# Patient Record
Sex: Female | Born: 1980
Health system: Southern US, Community
[De-identification: ages and names within clinical notes are randomized; demographics above are authoritative.]

## PROBLEM LIST (undated history)

## (undated) DIAGNOSIS — F1110 Opioid abuse, uncomplicated: Secondary | ICD-10-CM

## (undated) DIAGNOSIS — K259 Gastric ulcer, unspecified as acute or chronic, without hemorrhage or perforation: Secondary | ICD-10-CM

## (undated) DIAGNOSIS — N83209 Unspecified ovarian cyst, unspecified side: Secondary | ICD-10-CM

## (undated) DIAGNOSIS — S83006A Unspecified dislocation of unspecified patella, initial encounter: Secondary | ICD-10-CM

## (undated) DIAGNOSIS — K219 Gastro-esophageal reflux disease without esophagitis: Secondary | ICD-10-CM

## (undated) DIAGNOSIS — K429 Umbilical hernia without obstruction or gangrene: Secondary | ICD-10-CM

## (undated) DIAGNOSIS — R519 Headache, unspecified: Secondary | ICD-10-CM

## (undated) DIAGNOSIS — F4325 Adjustment disorder with mixed disturbance of emotions and conduct: Secondary | ICD-10-CM

## (undated) DIAGNOSIS — R55 Syncope and collapse: Secondary | ICD-10-CM

## (undated) DIAGNOSIS — O039 Complete or unspecified spontaneous abortion without complication: Secondary | ICD-10-CM

## (undated) DIAGNOSIS — F419 Anxiety disorder, unspecified: Secondary | ICD-10-CM

## (undated) HISTORY — PX: WISDOM TOOTH EXTRACTION: SHX21

## (undated) HISTORY — PX: ESOPHAGOGASTRODUODENOSCOPY: SHX1529

---
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
## 2004-05-31 ENCOUNTER — Other Ambulatory Visit: Admission: RE | Admit: 2004-05-31 | Discharge: 2004-05-31 | Payer: Self-pay | Admitting: Obstetrics and Gynecology

## 2007-08-09 ENCOUNTER — Inpatient Hospital Stay (HOSPITAL_COMMUNITY): Admission: AD | Admit: 2007-08-09 | Discharge: 2007-08-09 | Payer: Self-pay | Admitting: Gynecology

## 2007-08-15 ENCOUNTER — Encounter (INDEPENDENT_AMBULATORY_CARE_PROVIDER_SITE_OTHER): Payer: Self-pay | Admitting: Obstetrics & Gynecology

## 2007-08-15 ENCOUNTER — Inpatient Hospital Stay (HOSPITAL_COMMUNITY): Admission: RE | Admit: 2007-08-15 | Discharge: 2007-08-18 | Payer: Self-pay | Admitting: Obstetrics and Gynecology

## 2009-06-08 ENCOUNTER — Emergency Department (HOSPITAL_COMMUNITY): Admission: EM | Admit: 2009-06-08 | Discharge: 2009-06-08 | Payer: Self-pay | Admitting: Family Medicine

## 2010-02-20 ENCOUNTER — Emergency Department (HOSPITAL_COMMUNITY): Admission: EM | Admit: 2010-02-20 | Discharge: 2010-02-20 | Payer: Self-pay | Admitting: Family Medicine

## 2011-04-22 NOTE — Consult Note (Signed)
[place]  Patient:    Debbie Griffin, [name]                        MRN: [id number] Proc. Date: 11/16/00 Adm. Date:  [id number] Attending:  Marily Memos CC:         Nolon Nations, M.D., M.P.H., [facility]   Consultation Report  DATE OF BIRTH:  1981/10/24  REASON FOR CONSULTATION:  Abdominal pain.  HISTORY OF ILLNESS:  Debbie Griffin is a 30 year old white female who is a patient of Dr. Corine Shelter, developed a kind of sudden onset of abdominal pain at about 4 P.M. this afternoon after getting out of school.  She had pizza for lunch. She has had no chronic or acute prior episodes of abdominal pain like this. She was seen in their office and in the emergency room where she had been evaluated by Dr. Carleene Cooper.  He has asked me to see her from a surgical standpoint for abdominal pain.  PAST MEDICAL HISTORY:  She denies any history of peptic ulcer disease, liver disease, prior abdominal surgery or problems like this.  REVIEW OF SYSTEMS:  Pulmonary:  No history of pneumonia or tuberculosis. Cardiac:  No history of chest pain.  Gastrointestinal:  See history of present illness.  Urologic:  No history of kidney stones or kidney infection.  Gyn: She was recently started on birth control pills last month because of cramps and excessive bleeding; and, she is actually on her period or starting her period right about now.  SOCIAL HISTORY:  She is an Warden/ranger at [employer].  PHYSICAL EXAMINATION:  VITAL SIGNS:  On physical exam her temperature is 98.3, pulse 63, respirations 26 and blood pressure 117/57.  GENERAL APPEARANCE:  She is a well-nourished white female who is somewhat hysterical in her complaints.  She really cannot give much of a history.  Her mother gave almost her entire history.  She is lying on her left side where she is most comfortable.  NECK:  Supple.  LUNGS:  Clear to auscultation.  HEART:   Regular rate and rhythm.  ABDOMEN:  When distracted really is pretty benign.  I hear active bowel sounds.  She has no guarding or rebound, though when she tenses up she is tender on abdominal exam.  PELVIC AND RECTAL:  I did not try to do a pelvic or rectal because of her hysteria.  I really do not think it will add to my initial evaluation or diagnosis.  LABORATORY DATA:  Totally normal liver function tests with an SGOT of 21, SGPT of 14 and alkaline phos of 82, bilirubin of 0.8.  Her lipase is 33.  Her white blood cell count is 7,900 with an absolutely normal differential with 64% neutrophils, 29% lymphocytes, 4% monocytes, and 1% eosinophils.  Her urine pregnancy was negative.  IMPRESSION:  Some of an acute gastrointestinal inflammation like gastroenteritis versus a ruptured ovarian cyst versus some type of abdominal spasm, but she certainly has no evidence of acute surgical abdomen.  I reviewed the CT scan with Dr. Bonney Leitz.  We thought we could visualize the appendix.  It showed no evidence of inflammation or stranding.  She does have a cyst of the right ovary, but she has really minimal if any fluid in her pelvis.  She has no other stigmata on CT scan of abdominal catastrophe.  Dr. Ignacia Palma will be back in touch with  [facility] and Dr. Corine Shelter regarding further evaluation; and, I will follow patient either in house or discharge. DD:  11/16/00 TD:  11/16/00 Job: [id number]

## 2011-04-22 NOTE — H&P (Signed)
[place]. [facility]  Patient:    Debbie Griffin, Debbie Griffin                        MRN: [id number] Adm. Date:  [id number] Attending:  Marily Memos                         History and Physical  DATE OF BIRTH: [birth year]  CHIEF COMPLAINT: Abdominal pain.  HISTORY OF PRESENT ILLNESS: This patient is a 30 year old white female with apparent negative past medical history, who had sudden onset of abdominal pain in the epigastric area at approximately 3:30 p.m. after school let out today. This rapidly worsened and by 4 p.m. the pain was sharp and severe and constant, and worsened with movement.  She had at that time no complaints of nausea, vomiting, diarrhea, fever, dysuria, or frequency - although since drinking the CT contrast she has had some nausea and vomiting.  There is no history of alcohol use or GI problems.  She does have painful menses and is currently having her period.  PAST MEDICAL HISTORY: Negative.  PAST SURGICAL HISTORY: None.  MEDICATIONS: None.  ALLERGIES: No known drug allergies.  SOCIAL HISTORY: No cigarettes, no alcohol, no drugs.  The patient attends school and is active in basketball and soccer.  FAMILY HISTORY: Negative for CAD, cancer, hypertension, diabetes, inflammatory bowel disease, COPD.  REVIEW OF SYSTEMS: Positive for abdominal pain with deep inspiration.  No chest pain, headache, visual changes, sore throat, cough, rhinorrhea, bright red blood per rectum, hematemesis.  Please see above.  PHYSICAL EXAMINATION:  VITAL SIGNS: Temperature 96.6 degrees.  Vital signs stable.  Pulse oximetry 100% on room air.  GENERAL: The patient is drowsy.  SKIN: Warm and dry.  No rash.  HEENT: TMs clear.  EOMI.  PERRL.  Pharynx without lesions.  Oropharynx clear. Good dentition.  Mucosa moist.  NECK: Supple.  No thyromegaly or adenopathy.  No JVD.  LUNGS: Clear.  No wheezes or crackles.  BACK: No CVA  tenderness.  CARDIOVASCULAR: S1 and S2, normal rate.  ABDOMEN: Positive bowel sounds, nondistended.  No hepatosplenomegaly or masses, though examination is difficult secondary to pain.  She does have significant pain to palpation in the epigastric and left upper quadrant area. Negative rebound.  GU/RECTAL: Examinations are deferred.  EXTREMITIES: Pulses 2+.  No clubbing, cyanosis, or edema.  NEUROLOGIC: The patient is alert and oriented x 4.  Cranial nerves 2-12 intact.  Motor 5/5.  DTRs 2+.  Sensory intact to fine touch.  LABORATORY DATA: CBC, CMET, lipase, and urinalysis are normal.  Urine pregnancy test negative.  Abdominal CT shows 2.2 cm right ovarian cyst, otherwise within normal limits.  ASSESSMENT/PLAN: The patient is a 30 year old with acute onset of severe abdominal pain.  The patient has been evaluated by Dr. Ovidio Kin and will be admitted for observation.  She will be made NPO and given intravenous fluids as well as morphine to control pain.  Amylase added to laboratories. The patient may need gastroenterology consultation for further evaluation to rule out gastric or other gastrointestinal pathology. DD:  11/16/00 TD:  11/16/00 Job: [id number]

## 2011-05-16 ENCOUNTER — Other Ambulatory Visit: Payer: Self-pay | Admitting: Obstetrics and Gynecology

## 2011-09-16 LAB — CBC
HCT: 31.3 — ABNORMAL LOW
HCT: 32.1 — ABNORMAL LOW
HCT: 36.3
HCT: 37.4
Hemoglobin: 11 — ABNORMAL LOW
Hemoglobin: 11.1 — ABNORMAL LOW
Hemoglobin: 12.7
Hemoglobin: 13.1
MCHC: 34.7
MCHC: 34.9
MCHC: 35.1
MCHC: 35.2
MCV: 90.4
MCV: 91.4
MCV: 91.8
MCV: 92.5
Platelets: 104 — ABNORMAL LOW
Platelets: 108 — ABNORMAL LOW
Platelets: 123 — ABNORMAL LOW
Platelets: 99 — ABNORMAL LOW
RBC: 3.41 — ABNORMAL LOW
RBC: 3.47 — ABNORMAL LOW
RBC: 3.97
RBC: 4.13
RDW: 12
RDW: 12.2
RDW: 12.2
RDW: 12.4
WBC: 11.1 — ABNORMAL HIGH
WBC: 12.4 — ABNORMAL HIGH
WBC: 13.2 — ABNORMAL HIGH
WBC: 22.8 — ABNORMAL HIGH

## 2011-09-16 LAB — DIFFERENTIAL
Basophils Absolute: 0
Basophils Absolute: 0
Basophils Relative: 0
Basophils Relative: 0
Eosinophils Absolute: 0.2
Eosinophils Absolute: 0.2
Eosinophils Relative: 1
Eosinophils Relative: 2
Lymphocytes Relative: 17
Lymphocytes Relative: 20
Lymphs Abs: 2.1
Lymphs Abs: 2.2
Monocytes Absolute: 0.8 — ABNORMAL HIGH
Monocytes Absolute: 0.8 — ABNORMAL HIGH
Monocytes Relative: 6
Monocytes Relative: 7
Neutro Abs: 7.8 — ABNORMAL HIGH
Neutro Abs: 9.3 — ABNORMAL HIGH
Neutrophils Relative %: 71
Neutrophils Relative %: 75

## 2011-09-16 LAB — RPR: RPR Ser Ql: NONREACTIVE

## 2012-10-10 ENCOUNTER — Inpatient Hospital Stay (HOSPITAL_COMMUNITY): Payer: Self-pay

## 2012-10-10 ENCOUNTER — Inpatient Hospital Stay (HOSPITAL_COMMUNITY)
Admission: AD | Admit: 2012-10-10 | Discharge: 2012-10-11 | Disposition: A | Discharge status: Home / Self Care | Payer: Self-pay | Source: Ambulatory Visit | Attending: Emergency Medicine | Admitting: Emergency Medicine

## 2012-10-10 ENCOUNTER — Encounter (HOSPITAL_COMMUNITY): Payer: Self-pay

## 2012-10-10 DIAGNOSIS — F17200 Nicotine dependence, unspecified, uncomplicated: Secondary | ICD-10-CM | POA: Insufficient documentation

## 2012-10-10 DIAGNOSIS — K254 Chronic or unspecified gastric ulcer with hemorrhage: Secondary | ICD-10-CM | POA: Insufficient documentation

## 2012-10-10 DIAGNOSIS — Z8742 Personal history of other diseases of the female genital tract: Secondary | ICD-10-CM | POA: Insufficient documentation

## 2012-10-10 HISTORY — DX: Complete or unspecified spontaneous abortion without complication: O03.9

## 2012-10-10 HISTORY — DX: Unspecified ovarian cyst, unspecified side: N83.209

## 2012-10-10 LAB — CBC WITH DIFFERENTIAL/PLATELET
Basophils Absolute: 0 10*3/uL (ref 0.0–0.1)
Basophils Relative: 0 % (ref 0–1)
Eosinophils Relative: 1 % (ref 0–5)
HCT: 41 % (ref 36.0–46.0)
MCHC: 35.1 g/dL (ref 30.0–36.0)
MCV: 89.3 fL (ref 78.0–100.0)
Monocytes Absolute: 0.5 10*3/uL (ref 0.1–1.0)
RDW: 12.1 % (ref 11.5–15.5)

## 2012-10-10 LAB — OCCULT BLOOD, POC DEVICE: Fecal Occult Bld: POSITIVE

## 2012-10-10 MED ORDER — KETOROLAC TROMETHAMINE 60 MG/2ML IM SOLN
60.0000 mg | Freq: Once | INTRAMUSCULAR | Status: AC
  Administered 2012-10-10: 60 mg via INTRAMUSCULAR
  Filled 2012-10-10: qty 2

## 2012-10-10 MED ORDER — MORPHINE SULFATE 4 MG/ML IJ SOLN
INTRAMUSCULAR | Status: AC
  Filled 2012-10-10: qty 1

## 2012-10-10 MED ORDER — ONDANSETRON HCL 4 MG/2ML IJ SOLN
4.0000 mg | Freq: Once | INTRAMUSCULAR | Status: DC
  Filled 2012-10-10: qty 2

## 2012-10-10 MED ORDER — SODIUM CHLORIDE 0.9 % IV SOLN
INTRAVENOUS | Status: DC
  Administered 2012-10-10: 20:00:00 via INTRAVENOUS

## 2012-10-10 MED ORDER — MORPHINE SULFATE 10 MG/ML IJ SOLN: 2.0000 mg | Freq: Once | INTRAMUSCULAR | Status: DC

## 2012-10-10 MED ORDER — MORPHINE SULFATE 10 MG/ML IJ SOLN
2.0000 mg | Freq: Once | INTRAMUSCULAR | Status: AC
  Administered 2012-10-10: 2 mg via INTRAVENOUS
  Filled 2012-10-10: qty 1

## 2012-10-10 MED ORDER — MORPHINE SULFATE 4 MG/ML IJ SOLN
2.0000 mg | Freq: Once | INTRAMUSCULAR | Status: AC
  Administered 2012-10-10: 2 mg via INTRAVENOUS
  Filled 2012-10-10: qty 1

## 2012-10-10 NOTE — MAU Note (Signed)
P states stomach hurt like this last Monday, then began again today, was told she had 2 ovarian cysts with one hemorrhaging per MD office. Has been bleeding x1.5 months.

## 2012-10-10 NOTE — ED Notes (Signed)
Received report from charge nurse about a pt [facility]) coming to ED, room 2. Shortly after, pt  Came in via EMS, pt was made comfortable in bed, vital signs were obtained, pt was placed on the monitor, and assessment was made. Pt stated that she did have a miscarriage about 10 weeks ago and has been doing fine since then till last week Monday she started bleeding in the middle of the night with abd pain and also started vomiting blood. Pt states she went and saw her OBG doctor, some tests were run and she was told she had a cysts on her ovaries, in any case the bleeding and vomiting stopped. Then today she started having the abd pain again and vomiting blood x3 which was dark brown in color. She went to see her OBG doctor today and she was transferred over her. She said she was given some morphine IV for a pain of 9/10 and now it is 7/10. Will continue to monitor pt

## 2012-10-10 NOTE — MAU Note (Signed)
Lower abd pain, dx with bilateral ovarian cyst- one is bleeding.  Last wk with pain was throwing up- noted blood in emesis.  Bad pain started again today, called MD- instucted to come here.

## 2012-10-10 NOTE — MAU Provider Note (Signed)
History     CSN: [id number]  Arrival date and time: 10/10/12 4098   First Provider Initiated Contact with Patient 10/10/12 1710      Chief Complaint  Patient presents with  . Abdominal Pain  . Hematemesis   HPI This is a 31 y.o. female at early gestation with a known SAB in progress who presents with severe lower abdominal pain which precipitated vomiting.  The vomitus was comprised of mostly blood. Denies fever.  Denies diarrhea or constipation. RN Note: Lower abd pain, dx with bilateral ovarian cyst- one is bleeding. Last wk with pain was throwing up- noted blood in emesis. Bad pain started again today, called MD- instucted to come here.      OB History    Grav Para Term Preterm Abortions TAB SAB Ect Mult Living   2 1 1  1  1   1       Past Medical History  Diagnosis Date  . Miscarriage   . Ovarian cyst     Past Surgical History  Procedure Date  . Wisdom tooth extraction     Family History  Problem Relation Age of Onset  . Other Neg Hx     History  Substance Use Topics  . Smoking status: Current Some Day Smoker    Types: Cigarettes  . Smokeless tobacco: Never Used  . Alcohol Use: No    Allergies: No Known Allergies  Prescriptions prior to admission  Medication Sig Dispense Refill  . ibuprofen (ADVIL,MOTRIN) 200 MG tablet Take 400 mg by mouth every 6 (six) hours as needed. Knee pain      . oxycodone-acetaminophen (PERCOCET) 2.5-325 MG per tablet Take 1 tablet by mouth every 4 (four) hours as needed. Stomach pain        ROS See HPI  Physical Exam   Blood pressure 115/82, pulse 83, temperature 98.1 F (36.7 C), temperature source Oral, resp. rate 22, height 5' 5.5" (1.664 m), weight 170 lb (77.111 kg).  Physical Exam  Constitutional: She is oriented to person, place, and time. She appears well-developed and well-nourished. She appears distressed (with pain).  Cardiovascular: Normal rate and regular rhythm.   Respiratory: Effort normal.  GI: Soft.  She exhibits no distension and no mass. There is tenderness (slight over lower abdomen, does not localize.). There is no rebound and no guarding.  Genitourinary: Uterus normal. Vaginal discharge (small blood) found.       Uterus small, somewhat tender No cervical motion tenderness  Musculoskeletal: Normal range of motion.  Neurological: She is alert and oriented to person, place, and time.  Skin: Skin is warm and dry.  Psychiatric: She has a normal mood and affect.   US Pelvis Complete  10/10/2012  *RADIOLOGY REPORT*  Clinical Data: Severe lower abdominal pain, known bilateral ovarian cysts; unknown pregnancy status; per nurse, recent pregnancy, patient being followed as a potential spontaneous abortion in progress  TRANSABDOMINAL AND TRANSVAGINAL ULTRASOUND OF PELVIS Technique:  Both transabdominal and transvaginal ultrasound examinations of the pelvis were performed. Transabdominal technique was performed for global imaging of the pelvis including uterus, ovaries, adnexal regions, and pelvic cul-de-sac.  It was necessary to proceed with endovaginal exam following the transabdominal exam to visualize the endometrium and ovaries.  Comparison:  None  Findings:  Uterus: 8.3 cm length by 4.5 cm AP and 7.1 cm transverse.  No focal uterine mass.  Endometrium: Abnormal appearance, thickened, heterogeneous echogenicity, 15 mm diameter and upper uterine segment.  No definite endometrial fluid or gestational sac  identified.  Right ovary:  4.5 x 2.5 x 2.6 cm.  Normal morphology without mass. Largest follicle measures 2.3 cm diameter.  Left ovary: 3.3 x 2.5 x 2.4 cm.  Normal morphology without mass.  Other findings: No free pelvic fluid or adnexal masses identified.  IMPRESSION: Abnormal appearance of the endometrial complex which is thickened and heterogeneous in echotexture. This is nonspecific can be seen with hyperplasia, tumor, or potentially with blood/clot within endometrial canal. As the patient's current  pregnancy status is uncertain, ectopic pregnancy cannot be excluded in this setting; correlation with quantitative beta HCG recommended. Unremarkable adnexae and ovaries.   Original Report Authenticated By: Ulyses Southward, M.D.     MAU Course  Procedures  MDM Discussed with Dr Claiborne Billings.  Ordered Toradol for headache and abdominal pain and [place] to evaluated uterine contents.  Assessment and Plan  Care assumed by Dr Claiborne Billings who has conferred with [facility] ED for transfer.  Wynelle Bourgeois 10/10/2012, 5:50 PM

## 2012-10-10 NOTE — ED Notes (Signed)
ZOX:[id number]<AV> Expected date:10/10/12<BR> Expected time: 9:28 PM<BR> Means of arrival:<BR> Comments:<BR> [facility]

## 2012-10-10 NOTE — ED Notes (Signed)
Pt was given ice chips, attempted to collect urine sample and patient stated she cannot urinate at this time.

## 2012-10-10 NOTE — MAU Note (Signed)
Aug 3, period in Sept was 2 wks late.  Had pos preg at [facility] last wk.  Has had repeated blood work- level is going down.

## 2012-10-11 ENCOUNTER — Inpatient Hospital Stay (HOSPITAL_COMMUNITY): Payer: Self-pay

## 2012-10-11 LAB — COMPREHENSIVE METABOLIC PANEL
AST: 19 U/L (ref 0–37)
CO2: 22 mEq/L (ref 19–32)
Calcium: 9 mg/dL (ref 8.4–10.5)
Creatinine, Ser: 0.87 mg/dL (ref 0.50–1.10)
GFR calc non Af Amer: 89 mL/min — ABNORMAL LOW (ref >90)

## 2012-10-11 LAB — URINE MICROSCOPIC-ADD ON

## 2012-10-11 LAB — LIPASE, BLOOD: Lipase: 22 U/L (ref 11–59)

## 2012-10-11 LAB — URINALYSIS, ROUTINE W REFLEX MICROSCOPIC
Glucose, UA: NEGATIVE mg/dL
Leukocytes, UA: NEGATIVE
Nitrite: NEGATIVE
Protein, ur: 30 mg/dL — AB
Urobilinogen, UA: 1 mg/dL (ref 0.0–1.0)

## 2012-10-11 MED ORDER — GI COCKTAIL ~~LOC~~
30.0000 mL | Freq: Once | ORAL | Status: AC
  Administered 2012-10-11: 30 mL via ORAL
  Filled 2012-10-11: qty 30

## 2012-10-11 MED ORDER — PANTOPRAZOLE SODIUM 40 MG PO TBEC
DELAYED_RELEASE_TABLET | ORAL | Status: AC
  Filled 2012-10-11: qty 1

## 2012-10-11 MED ORDER — HYDROCODONE-ACETAMINOPHEN 5-325 MG PO TABS: 1.0000 | ORAL_TABLET | ORAL | Status: DC | PRN

## 2012-10-11 MED ORDER — PANTOPRAZOLE SODIUM 40 MG PO TBEC: 40.0000 mg | DELAYED_RELEASE_TABLET | Freq: Two times a day (BID) | ORAL | Status: DC

## 2012-10-11 MED ORDER — FAMOTIDINE 40 MG PO TABS: 40.0000 mg | ORAL_TABLET | Freq: Every day | ORAL | Status: DC

## 2012-10-11 MED ORDER — SODIUM CHLORIDE 0.9 % IV SOLN
80.0000 mg | Freq: Once | INTRAVENOUS | Status: AC
  Administered 2012-10-11: 80 mg via INTRAVENOUS
  Filled 2012-10-11: qty 80

## 2012-10-11 MED ORDER — HYDROMORPHONE HCL PF 1 MG/ML IJ SOLN
1.0000 mg | Freq: Once | INTRAMUSCULAR | Status: AC
Start: 2012-10-11 — End: 2012-10-11
  Administered 2012-10-11: 1 mg via INTRAVENOUS
  Filled 2012-10-11: qty 1

## 2012-10-11 NOTE — ED Provider Notes (Signed)
Medical screening examination/treatment/procedure(s) were performed by non-physician practitioner and as supervising physician I was immediately available for consultation/collaboration.    Bear Osten D Riannah Stagner, MD 10/11/12 0716 

## 2012-10-11 NOTE — ED Provider Notes (Signed)
History     CSN: [id number]  Arrival date & time 10/10/12  1602   First MD Initiated Contact with Patient 10/10/12 2314      Chief Complaint  Patient presents with  . Abdominal Pain  . Hematemesis   HPI  History provided by the patient. Patient is a 31 year old female with history of recent spontaneous miscarriage and oriented cysts who presents from [facility] hospital with complaints of nausea vomiting and hematemesis. Patient reports having episodes of nausea and vomiting for the past few days. She had also had episodes of hematemesis one week ago and was evaluated by her OB/GYN doctor. She reports having ultrasound showing ovarian cysts at that time. She has had some waxing waning of symptoms with occasional improvement but had return of nausea vomiting today. Patient took photos of her hematemesis. She returned to the [facility] hospital for evaluation and states that her ovarian cysts had resolved and sent here to the emergency room for further evaluation of her other symptoms. She was treated with some pain medications with relief of abdominal pain for 30-60 minutes. Currently she reports return of pain. Patient denies any changes in bowel movements. Denies any constipation or diarrhea. She does report having positive Hemoccult in her doctors office one week ago. She denies any acid reflux symptoms or increased belching. She denies having similar symptoms previously. Pt does report frequent and chronic NSAID use including aspirin and ibuprofen daily.     Past Medical History  Diagnosis Date  . Miscarriage   . Ovarian cyst     Past Surgical History  Procedure Date  . Wisdom tooth extraction     Family History  Problem Relation Age of Onset  . Other Neg Hx     History  Substance Use Topics  . Smoking status: Current Some Day Smoker    Types: Cigarettes  . Smokeless tobacco: Never Used  . Alcohol Use: No    OB History    Grav Para Term Preterm Abortions TAB SAB Ect Mult  Living   2 1 1  1  1   1       Review of Systems  Constitutional: Positive for appetite change. Negative for fever, chills and fatigue.  HENT: Negative for sore throat.   Respiratory: Negative for cough and shortness of breath.   Cardiovascular: Negative for chest pain.  Gastrointestinal: Positive for nausea, vomiting and abdominal pain. Negative for diarrhea, constipation and blood in stool.  Genitourinary: Negative for dysuria, frequency, hematuria, flank pain, vaginal bleeding and vaginal discharge.    Allergies  Review of patient's allergies indicates no known allergies.  Home Medications   Current Outpatient Rx  Name  Route  Sig  Dispense  Refill  . IBUPROFEN 200 MG PO TABS   Oral   Take 400 mg by mouth every 6 (six) hours as needed. Knee pain         . OXYCODONE-ACETAMINOPHEN 2.5-325 MG PO TABS   Oral   Take 1 tablet by mouth every 4 (four) hours as needed. Stomach pain           BP 119/56  Pulse 61  Temp 98 F (36.7 C) (Oral)  Resp 20  Ht 5' 5.5" (1.664 m)  Wt 170 lb (77.111 kg)  BMI 27.86 kg/m2  SpO2 100%  LMP 08/26/2012  Physical Exam  Nursing note and vitals reviewed. Constitutional: She is oriented to person, place, and time. She appears well-developed and well-nourished. No distress.  HENT:  Head: Normocephalic.  Cardiovascular: Normal rate and regular rhythm.   Pulmonary/Chest: Effort normal and breath sounds normal. No respiratory distress. She has no wheezes. She has no rales.  Abdominal: Soft. There is no hepatosplenomegaly. There is tenderness in the epigastric area. There is no rigidity, no rebound, no guarding, no CVA tenderness, no tenderness at McBurney's point and negative Murphy's sign.  Neurological: She is alert and oriented to person, place, and time.  Skin: Skin is warm and dry. No rash noted.  Psychiatric: She has a normal mood and affect. Her behavior is normal.    ED Course  Procedures   Results for orders placed during the  hospital encounter of 10/10/12  CBC WITH DIFFERENTIAL      Component Value Range   WBC 9.7  4.0 - 10.5 K/uL   RBC 4.59  3.87 - 5.11 MIL/uL   Hemoglobin 14.4  12.0 - 15.0 g/dL   HCT 16.1  09.6 - 04.5 %   MCV 89.3  78.0 - 100.0 fL   MCH 31.4  26.0 - 34.0 pg   MCHC 35.1  30.0 - 36.0 g/dL   RDW 40.9  81.1 - 91.4 %   Platelets 282  150 - 400 K/uL   Neutrophils Relative 64  43 - 77 %   Neutro Abs 6.2  1.7 - 7.7 K/uL   Lymphocytes Relative 29  12 - 46 %   Lymphs Abs 2.8  0.7 - 4.0 K/uL   Monocytes Relative 6  3 - 12 %   Monocytes Absolute 0.5  0.1 - 1.0 K/uL   Eosinophils Relative 1  0 - 5 %   Eosinophils Absolute 0.1  0.0 - 0.7 K/uL   Basophils Relative 0  0 - 1 %   Basophils Absolute 0.0  0.0 - 0.1 K/uL  COMPREHENSIVE METABOLIC PANEL      Component Value Range   Sodium 139  135 - 145 mEq/L   Potassium 3.8  3.5 - 5.1 mEq/L   Chloride 103  96 - 112 mEq/L   CO2 22  19 - 32 mEq/L   Glucose, Bld 88  70 - 99 mg/dL   BUN 19  6 - 23 mg/dL   Creatinine, Ser 7.82  0.50 - 1.10 mg/dL   Calcium 9.0  8.4 - 95.6 mg/dL   Total Protein 6.9  6.0 - 8.3 g/dL   Albumin 4.2  3.5 - 5.2 g/dL   AST 19  0 - 37 U/L   ALT 29  0 - 35 U/L   Alkaline Phosphatase 64  39 - 117 U/L   Total Bilirubin 1.3 (*) 0.3 - 1.2 mg/dL   GFR calc non Af Amer 89 (*) >90 mL/min   GFR calc Af Amer >90  >90 mL/min  LIPASE, BLOOD      Component Value Range   Lipase 22  11 - 59 U/L  URINALYSIS, ROUTINE W REFLEX MICROSCOPIC      Component Value Range   Color, Urine AMBER (*) YELLOW   APPearance CLOUDY (*) CLEAR   Specific Gravity, Urine 1.044 (*) 1.005 - 1.030   pH 5.5  5.0 - 8.0   Glucose, UA NEGATIVE  NEGATIVE mg/dL   Hgb urine dipstick LARGE (*) NEGATIVE   Bilirubin Urine SMALL (*) NEGATIVE   Ketones, ur 15 (*) NEGATIVE mg/dL   Protein, ur 30 (*) NEGATIVE mg/dL   Urobilinogen, UA 1.0  0.0 - 1.0 mg/dL   Nitrite NEGATIVE  NEGATIVE   Leukocytes, UA NEGATIVE  NEGATIVE  OCCULT BLOOD, POC DEVICE      Component Value  Range   Fecal Occult Bld POSITIVE    POCT PREGNANCY, URINE      Component Value Range   Preg Test, Ur NEGATIVE  NEGATIVE  URINE MICROSCOPIC-ADD ON      Component Value Range   Squamous Epithelial / LPF FEW (*) RARE   RBC / HPF 7-10  <3 RBC/hpf   Urine-Other MUCOUS PRESENT        US Transvaginal Non-ob  10/10/2012  *RADIOLOGY REPORT*  Clinical Data: Severe lower abdominal pain, known bilateral ovarian cysts; unknown pregnancy status; per nurse, recent pregnancy, patient being followed as a potential spontaneous abortion in progress  TRANSABDOMINAL AND TRANSVAGINAL ULTRASOUND OF PELVIS Technique:  Both transabdominal and transvaginal ultrasound examinations of the pelvis were performed. Transabdominal technique was performed for global imaging of the pelvis including uterus, ovaries, adnexal regions, and pelvic cul-de-sac.  It was necessary to proceed with endovaginal exam following the transabdominal exam to visualize the endometrium and ovaries.  Comparison:  None  Findings:  Uterus: 8.3 cm length by 4.5 cm AP and 7.1 cm transverse.  No focal uterine mass.  Endometrium: Abnormal appearance, thickened, heterogeneous echogenicity, 15 mm diameter and upper uterine segment.  No definite endometrial fluid or gestational sac identified.  Right ovary:  4.5 x 2.5 x 2.6 cm.  Normal morphology without mass. Largest follicle measures 2.3 cm diameter.  Left ovary: 3.3 x 2.5 x 2.4 cm.  Normal morphology without mass.  Other findings: No free pelvic fluid or adnexal masses identified.  IMPRESSION: Abnormal appearance of the endometrial complex which is thickened and heterogeneous in echotexture. This is nonspecific can be seen with hyperplasia, tumor, or potentially with blood/clot within endometrial canal. As the patient's current pregnancy status is uncertain, ectopic pregnancy cannot be excluded in this setting; correlation with quantitative beta HCG recommended. Unremarkable adnexae and ovaries.   Original  Report Authenticated By: Ulyses Southward, M.D.    US Pelvis Complete  10/10/2012  *RADIOLOGY REPORT*  Clinical Data: Severe lower abdominal pain, known bilateral ovarian cysts; unknown pregnancy status; per nurse, recent pregnancy, patient being followed as a potential spontaneous abortion in progress  TRANSABDOMINAL AND TRANSVAGINAL ULTRASOUND OF PELVIS Technique:  Both transabdominal and transvaginal ultrasound examinations of the pelvis were performed. Transabdominal technique was performed for global imaging of the pelvis including uterus, ovaries, adnexal regions, and pelvic cul-de-sac.  It was necessary to proceed with endovaginal exam following the transabdominal exam to visualize the endometrium and ovaries.  Comparison:  None  Findings:  Uterus: 8.3 cm length by 4.5 cm AP and 7.1 cm transverse.  No focal uterine mass.  Endometrium: Abnormal appearance, thickened, heterogeneous echogenicity, 15 mm diameter and upper uterine segment.  No definite endometrial fluid or gestational sac identified.  Right ovary:  4.5 x 2.5 x 2.6 cm.  Normal morphology without mass. Largest follicle measures 2.3 cm diameter.  Left ovary: 3.3 x 2.5 x 2.4 cm.  Normal morphology without mass.  Other findings: No free pelvic fluid or adnexal masses identified.  IMPRESSION: Abnormal appearance of the endometrial complex which is thickened and heterogeneous in echotexture. This is nonspecific can be seen with hyperplasia, tumor, or potentially with blood/clot within endometrial canal. As the patient's current pregnancy status is uncertain, ectopic pregnancy cannot be excluded in this setting; correlation with quantitative beta HCG recommended. Unremarkable adnexae and ovaries.   Original Report Authenticated By: Ulyses Southward, M.D.    Dg Abd Acute W/chest  10/11/2012  *  RADIOLOGY REPORT*  Clinical Data: Abdominal pain for more than 1 week, on the right side.  Nausea.  ACUTE ABDOMEN SERIES (ABDOMEN 2 VIEW & CHEST 1 VIEW)  Comparison: Chest  radiograph performed 02/20/2010  Findings: The lungs are well-aerated and clear.  There is no evidence of focal opacification, pleural effusion or pneumothorax. The cardiomediastinal silhouette is within normal limits.  The visualized bowel gas pattern is unremarkable.  Scattered air is seen within the colon; there is no evidence of small bowel dilatation to suggest obstruction.  No free intra-abdominal air is identified on the provided upright view.  No acute osseous abnormalities are seen; the sacroiliac joints are unremarkable in appearance.  IMPRESSION:  1.  Unremarkable bowel gas pattern; no free intra-abdominal air seen. 2.  No acute cardiopulmonary process identified.   Original Report Authenticated By: Tonia Ghent, M.D.      1. Bleeding gastric ulcer       MDM  11:10PM patient seen and evaluated. Patient appears uncomfortable but in no acute distress.   Patient had CBC and pelvic ultrasound performed at [facility] hospital. She has normal H&H. Ultrasound unremarkable. Urine pregnancy negative.  And does report history of chronic NSAID use for her knee pains. She uses heavy amounts daily of aspirin and ibuprofen. Increasing her risk for peptic ulcer disease.   Angus Seller, [place] 10/11/12 [phone]

## 2013-03-26 ENCOUNTER — Emergency Department (HOSPITAL_COMMUNITY)
Admission: EM | Admit: 2013-03-26 | Discharge: 2013-03-26 | Disposition: A | Discharge status: Home / Self Care | Payer: BC Managed Care – PPO | Attending: Emergency Medicine | Admitting: Emergency Medicine

## 2013-03-26 ENCOUNTER — Encounter (HOSPITAL_COMMUNITY): Payer: Self-pay | Admitting: *Deleted

## 2013-03-26 DIAGNOSIS — K2970 Gastritis, unspecified, without bleeding: Secondary | ICD-10-CM

## 2013-03-26 DIAGNOSIS — K920 Hematemesis: Secondary | ICD-10-CM | POA: Insufficient documentation

## 2013-03-26 DIAGNOSIS — Z8742 Personal history of other diseases of the female genital tract: Secondary | ICD-10-CM | POA: Insufficient documentation

## 2013-03-26 DIAGNOSIS — R51 Headache: Secondary | ICD-10-CM | POA: Insufficient documentation

## 2013-03-26 DIAGNOSIS — R112 Nausea with vomiting, unspecified: Secondary | ICD-10-CM | POA: Insufficient documentation

## 2013-03-26 DIAGNOSIS — F17200 Nicotine dependence, unspecified, uncomplicated: Secondary | ICD-10-CM | POA: Insufficient documentation

## 2013-03-26 DIAGNOSIS — Z3202 Encounter for pregnancy test, result negative: Secondary | ICD-10-CM | POA: Insufficient documentation

## 2013-03-26 LAB — URINALYSIS, MICROSCOPIC ONLY
Glucose, UA: NEGATIVE mg/dL
Hgb urine dipstick: NEGATIVE
Specific Gravity, Urine: 1.026 (ref 1.005–1.030)
Urobilinogen, UA: 1 mg/dL (ref 0.0–1.0)
pH: 8 (ref 5.0–8.0)

## 2013-03-26 LAB — COMPREHENSIVE METABOLIC PANEL
ALT: 11 U/L (ref 0–35)
AST: 13 U/L (ref 0–37)
Calcium: 9.9 mg/dL (ref 8.4–10.5)
Sodium: 141 mEq/L (ref 135–145)
Total Protein: 7.1 g/dL (ref 6.0–8.3)

## 2013-03-26 MED ORDER — SUCRALFATE 1 GM/10ML PO SUSP: 1.0000 g | Freq: Four times a day (QID) | ORAL | Status: DC

## 2013-03-26 MED ORDER — PROMETHAZINE HCL 25 MG/ML IJ SOLN
25.0000 mg | Freq: Once | INTRAMUSCULAR | Status: AC
  Administered 2013-03-26: 25 mg via INTRAMUSCULAR
  Filled 2013-03-26: qty 1

## 2013-03-26 MED ORDER — HYDROMORPHONE HCL PF 1 MG/ML IJ SOLN
1.0000 mg | INTRAMUSCULAR | Status: AC
  Administered 2013-03-26: 1 mg via INTRAMUSCULAR
  Filled 2013-03-26: qty 1

## 2013-03-26 MED ORDER — PANTOPRAZOLE SODIUM 20 MG PO TBEC: 40.0000 mg | DELAYED_RELEASE_TABLET | Freq: Every day | ORAL | Status: DC

## 2013-03-26 NOTE — ED Provider Notes (Signed)
History     CSN: [id number]  Arrival date & time 03/26/13  1459   First MD Initiated Contact with Patient 03/26/13 1650      Chief Complaint  Patient presents with  . Abdominal Pain  . Headache    (Consider location/radiation/quality/duration/timing/severity/associated sxs/prior treatment) HPI Comments: Patient presents to the ER for evaluation of 3 days of nausea and vomiting. Patient reports that she has diffuse abdominal pain, but mainly in the upper central abdomen. She was also experiencing headaches. Patient reports that she has not been able to eat or drink much because of the symptoms. She reports a similar episode approximately 6 months ago. She was seen in the ER and referred to GI. She reports that she had EGD performed that did not show any abnormalities. Symptoms today are similar to what she was experiencing at that time. She does report that she had some blood in her vomit earlier today, she has had multiple episodes of vomiting since without blood. There is no fever. No difficulty breathing.   Past Medical History  Diagnosis Date  . Miscarriage   . Ovarian cyst     Past Surgical History  Procedure Laterality Date  . Wisdom tooth extraction      Family History  Problem Relation Age of Onset  . Other Neg Hx     History  Substance Use Topics  . Smoking status: Current Some Day Smoker    Types: Cigarettes  . Smokeless tobacco: Never Used  . Alcohol Use: No    OB History   Grav Para Term Preterm Abortions TAB SAB Ect Mult Living   2 1 1  1  1   1       Review of Systems  Constitutional: Negative for fever.  Gastrointestinal: Positive for nausea, vomiting and abdominal pain.  All other systems reviewed and are negative.    Allergies  Review of patient's allergies indicates no known allergies.  Home Medications   Current Outpatient Rx  Name  Route  Sig  Dispense  Refill  . oxyCODONE-acetaminophen (PERCOCET) 10-325 MG per tablet   Oral   Take 1  tablet by mouth every 6 (six) hours as needed for pain.           BP 127/91  Pulse 129  Temp(Src) 98.7 F (37.1 C) (Oral)  Resp 18  SpO2 94%  Physical Exam  Constitutional: She is oriented to person, place, and time. She appears well-developed and well-nourished. No distress.  HENT:  Head: Normocephalic and atraumatic.  Right Ear: Hearing normal.  Nose: Nose normal.  Mouth/Throat: Oropharynx is clear and moist and mucous membranes are normal.  Eyes: Conjunctivae and EOM are normal. Pupils are equal, round, and reactive to light.  Neck: Normal range of motion. Neck supple.  Cardiovascular: Normal rate, regular rhythm, S1 normal and S2 normal.  Exam reveals no gallop and no friction rub.   No murmur heard. Pulmonary/Chest: Effort normal and breath sounds normal. No respiratory distress. She exhibits no tenderness.  Abdominal: Soft. Normal appearance and bowel sounds are normal. There is no hepatosplenomegaly. There is tenderness in the epigastric area. There is no rebound, no guarding, no tenderness at McBurney's point and negative Murphy's sign. No hernia.  Musculoskeletal: Normal range of motion.  Neurological: She is alert and oriented to person, place, and time. She has normal strength. No cranial nerve deficit or sensory deficit. Coordination normal. GCS eye subscore is 4. GCS verbal subscore is 5. GCS motor subscore is 6.  Skin: Skin is warm, dry and intact. No rash noted. No cyanosis.  Psychiatric: She has a normal mood and affect. Her speech is normal and behavior is normal. Thought content normal.    ED Course  Procedures (including critical care time)  Labs Reviewed  COMPREHENSIVE METABOLIC PANEL - Abnormal; Notable for the following:    Glucose, Bld 114 (*)    Total Bilirubin 1.9 (*)    All other components within normal limits  URINALYSIS, MICROSCOPIC ONLY - Abnormal; Notable for the following:    Bacteria, UA FEW (*)    Squamous Epithelial / LPF FEW (*)    All  other components within normal limits  LIPASE, BLOOD  POCT PREGNANCY, URINE   No results found.   Diagnosis: Abdominal pain, suspect gastritis    MDM  Patient presents to the ER for evaluation of abdominal pain with nausea and vomiting. She has had similar episodes previously. She has been seen by GI for this. EGD was reportedly normal. This was approximately 6 months ago. She does report hematemesis, but that was earlier today. Has had multiple episodes since without bleeding. It was a small amount of blood. Ulcer was ruled out last time with similar symptoms.   Lab work is entirely normal including hemoglobin. The only abnormality was slight elevation of total bilirubin with remaining LFTs normal. She had a similar pattern at her last evaluation. There is no right upper quadrant tenderness to suspect biliary colic. Remainder of the abdominal exam is benign. No focality to suspect an acute surgical process. Exam is essentially benign.   Was reportedly uses a lot of NSAIDs for her chronic joint pain. As her EKG was normal last time I suspect she had gastritis that resolved for the EGD was performed. Patient will need to see her GI specialist. She will be treated with analgesia and antiemetics here in the ER, initiate Carafate and PPI. Patient was discharged to followup as an outpatient. Return if symptoms worsen.        Gilda Crease, MD 03/26/13 [phone]

## 2013-04-25 ENCOUNTER — Other Ambulatory Visit: Payer: BC Managed Care – PPO

## 2013-04-25 ENCOUNTER — Other Ambulatory Visit: Payer: Self-pay | Admitting: Physician Assistant

## 2013-04-25 DIAGNOSIS — S0990XA Unspecified injury of head, initial encounter: Secondary | ICD-10-CM

## 2013-09-20 ENCOUNTER — Emergency Department (HOSPITAL_COMMUNITY)
Admission: EM | Admit: 2013-09-20 | Discharge: 2013-09-20 | Disposition: A | Discharge status: Home / Self Care | Payer: Commercial Managed Care - PPO | Attending: Emergency Medicine | Admitting: Emergency Medicine

## 2013-09-20 ENCOUNTER — Encounter (HOSPITAL_COMMUNITY): Payer: Self-pay | Admitting: Emergency Medicine

## 2013-09-20 DIAGNOSIS — Z79899 Other long term (current) drug therapy: Secondary | ICD-10-CM | POA: Insufficient documentation

## 2013-09-20 DIAGNOSIS — L508 Other urticaria: Secondary | ICD-10-CM

## 2013-09-20 DIAGNOSIS — F17200 Nicotine dependence, unspecified, uncomplicated: Secondary | ICD-10-CM | POA: Insufficient documentation

## 2013-09-20 DIAGNOSIS — L509 Urticaria, unspecified: Secondary | ICD-10-CM | POA: Insufficient documentation

## 2013-09-20 DIAGNOSIS — Z8742 Personal history of other diseases of the female genital tract: Secondary | ICD-10-CM | POA: Insufficient documentation

## 2013-09-20 MED ORDER — FAMOTIDINE IN NACL 20-0.9 MG/50ML-% IV SOLN
20.0000 mg | Freq: Once | INTRAVENOUS | Status: AC
  Administered 2013-09-20: 20 mg via INTRAVENOUS
  Filled 2013-09-20: qty 50

## 2013-09-20 MED ORDER — OXYCODONE-ACETAMINOPHEN 5-325 MG PO TABS
1.0000 | ORAL_TABLET | Freq: Once | ORAL | Status: AC
  Administered 2013-09-20: 1 via ORAL
  Filled 2013-09-20: qty 1

## 2013-09-20 MED ORDER — KETOROLAC TROMETHAMINE 30 MG/ML IJ SOLN
30.0000 mg | Freq: Once | INTRAMUSCULAR | Status: AC
  Administered 2013-09-20: 30 mg via INTRAVENOUS
  Filled 2013-09-20: qty 1

## 2013-09-20 MED ORDER — SODIUM CHLORIDE 0.9 % IV BOLUS (SEPSIS)
1000.0000 mL | Freq: Once | INTRAVENOUS | Status: AC
  Administered 2013-09-20: 1000 mL via INTRAVENOUS

## 2013-09-20 MED ORDER — LORATADINE 10 MG PO TABS: 10.0000 mg | ORAL_TABLET | Freq: Every day | ORAL | Status: DC

## 2013-09-20 MED ORDER — DIPHENHYDRAMINE HCL 50 MG/ML IJ SOLN
25.0000 mg | Freq: Once | INTRAMUSCULAR | Status: AC
  Administered 2013-09-20: 25 mg via INTRAVENOUS
  Filled 2013-09-20: qty 1

## 2013-09-20 MED ORDER — METHYLPREDNISOLONE SODIUM SUCC 125 MG IJ SOLR
125.0000 mg | Freq: Once | INTRAMUSCULAR | Status: AC
  Administered 2013-09-20: 125 mg via INTRAVENOUS
  Filled 2013-09-20: qty 2

## 2013-09-20 MED ORDER — PREDNISONE 10 MG PO TABS: 10.0000 mg | ORAL_TABLET | Freq: Every day | ORAL | Status: DC

## 2013-09-20 MED ORDER — FAMOTIDINE 20 MG PO TABS: 20.0000 mg | ORAL_TABLET | Freq: Two times a day (BID) | ORAL | Status: DC

## 2013-09-20 MED ORDER — EPINEPHRINE 0.3 MG/0.3ML IJ SOAJ
0.3000 mg | Freq: Once | INTRAMUSCULAR | Status: DC
  Filled 2013-09-20: qty 0.3

## 2013-09-20 MED ORDER — EPINEPHRINE 0.3 MG/0.3ML IJ SOAJ: 0.3000 mg | INTRAMUSCULAR | Status: DC | PRN

## 2013-09-20 NOTE — ED Notes (Signed)
Pt advised to call ride to pick her up because she is so tired. Pt is on phone attempting to call rides. Advised pt to wait in waiting room until ride can get her

## 2013-09-20 NOTE — ED Notes (Addendum)
Pt reports hives all over body, started 30 minutes ago. Does not know what caused hives, denies any new medication. States hives have rapidly spread and increased in size over past 30 minutes. Denies any hx of allergies. Reports taking 2 benadryl about 30 minutes ago, has not helped relieve itching. States only new thing today is getting contact lenses.

## 2013-09-20 NOTE — ED Provider Notes (Signed)
Medical screening examination/treatment/procedure(s) were performed by non-physician practitioner and as supervising physician I was immediately available for consultation/collaboration.   Kristen N Ward, DO 09/20/13 0523 

## 2013-09-20 NOTE — ED Provider Notes (Signed)
CSN: [id number]     Arrival date & time 09/20/13  0027 History   First MD Initiated Contact with Patient 09/20/13 0035     Chief Complaint  Patient presents with  . Urticaria   (Consider location/radiation/quality/duration/timing/severity/associated sxs/prior Treatment) HPI  Pt presents to the ED with complaints of acute urticaria. She was in the [employer] when she felt something on her stomach begin to itch, she noticed a red welt that begun to spread very quickly across her back, abdomen, between, her thighs and on her chest. It burns and is extremely uncomfortable. She does not know what touched her or of if she was bit by something but the discomfort is unbearable. She has no fever, no facial, lip or tongue swelling. No wheezing, SOB or weakness, fevers, nausea, vomiting or diarrhea.  Past Medical History  Diagnosis Date  . Miscarriage   . Ovarian cyst    Past Surgical History  Procedure Laterality Date  . Wisdom tooth extraction     Family History  Problem Relation Age of Onset  . Other Neg Hx    History  Substance Use Topics  . Smoking status: Current Some Day Smoker    Types: Cigarettes  . Smokeless tobacco: Never Used  . Alcohol Use: No   OB History   Grav Para Term Preterm Abortions TAB SAB Ect Mult Living   2 1 1  1  1   1      Review of Systems The patient denies anorexia, fever, weight loss,, vision loss, decreased hearing, hoarseness, chest pain, syncope, dyspnea on exertion, peripheral edema, balance deficits, hemoptysis, abdominal pain, melena, hematochezia, severe indigestion/heartburn, hematuria, incontinence, genital sores, muscle weakness, suspicious skin lesions, transient blindness, difficulty walking, depression, unusual weight change, abnormal bleeding, enlarged lymph nodes, angioedema, and breast masses.  Allergies  Review of patient's allergies indicates no known allergies.  Home Medications   Current Outpatient Rx  Name  Route  Sig   Dispense  Refill  . levonorgestrel (MIRENA) 20 MCG/24HR IUD   Intrauterine   1 each by Intrauterine route continuous.         . famotidine (PEPCID) 20 MG tablet   Oral   Take 1 tablet (20 mg total) by mouth 2 (two) times daily.   14 tablet   0   . loratadine (CLARITIN) 10 MG tablet   Oral   Take 1 tablet (10 mg total) by mouth daily.   14 tablet   0   . predniSONE (DELTASONE) 10 MG tablet   Oral   Take 1 tablet (10 mg total) by mouth daily.   21 tablet   0     Prednisone dose pack directions:   6 tabs on day ...    BP 88/48  Pulse 61  Temp(Src) 98 F (36.7 C) (Oral)  Resp 14  SpO2 95%  LMP 09/06/2013 Physical Exam  Nursing note and vitals reviewed. Constitutional: She appears well-developed and well-nourished. No distress.  HENT:  Head: Normocephalic and atraumatic.  Eyes: Pupils are equal, round, and reactive to light.  Neck: Normal range of motion. Neck supple.  Cardiovascular: Normal rate and regular rhythm.   Pulmonary/Chest: Effort normal.  Abdominal: Soft.  Neurological: She is alert.  Skin: Skin is warm and dry. Rash noted. Rash is urticarial (diffuse excoriated urticaria').    ED Course  Procedures (including critical care time) Labs Review Labs Reviewed - No data to display Imaging Review No results found.  EKG Interpretation   None  MDM   1. Urticaria, acute      Pt given IV saline, Solumedrol, benadryl and Pepcid IV.  1:45am - on re-evaluation the patients symptoms decreased from itching at a 10/10 and now down to a 5/10. She has a headache and requests medication for it. Will give a percocet and continue to monitor.  2:44am - patients rash is significantly resolving. She is complaining of headache and myalgias, she is also drowsy from all the medication. Will monitor till 6am, re-eval, then most likely discharge.  3:49 am -  Patient re-evaluated, no more headache. Rash is gone from abdomen, back, arms. Small amount of rash  to right groin, for the most part the rest has resolved, no more pain.   4:45 am - Monitoring patient, she is doing much better.  5:14 am- BP continues to remain stable. Pt says she is achy all over, will give 30mg  IV of Toradol.    EPINEPHrine (EPIPEN) 0.3 mg/0.3 mL SOAJ injection Inject 0.3 mLs (0.3 mg total) into the muscle as needed. 1 Device Angelyse Heslin Irine Seal, PA-C   famotidine (PEPCID) 20 MG tablet Take 1 tablet (20 mg total) by mouth 2 (two) times daily. 14 tablet Owin Vignola Irine Seal, PA-C   loratadine (CLARITIN) 10 MG tablet Take 1 tablet (10 mg total) by mouth daily. 14 tablet Romney Compean Irine Seal, PA-C   predniSONE (DELTASONE) 10 MG tablet Take 1 tablet (10 mg total) by mouth daily. 21 tablet Dorthula Matas, PA-C       32 y.o.[employer] Res[employer]'s evaluation in the Emergency Department is complete. It has been determined that no acute conditions requiring further emergency intervention are present at this time. The patient/guardian have been advised of the diagnosis and plan. We have discussed signs and symptoms that warrant return to the ED, such as changes or worsening in symptoms.  Vital signs are stable at discharge. Filed Vitals:   09/20/13 0500  BP: 103/59  Pulse: 61  Temp:   Resp: 13    Patient/guardian has voiced understanding and agreed to follow-up with the PCP or specialist.   Dorthula Matas, PA-C 09/20/13 1610

## 2013-09-20 NOTE — ED Notes (Signed)
Pt complaining of headache and joint pain - pt used bedpan, is now complaining of sharp/stabbing LLQ abdominal pain. PA notified.

## 2013-10-27 ENCOUNTER — Emergency Department (HOSPITAL_COMMUNITY)
Admission: EM | Admit: 2013-10-27 | Discharge: 2013-10-28 | Disposition: A | Discharge status: Home / Self Care | Payer: Commercial Managed Care - PPO | Attending: Emergency Medicine | Admitting: Emergency Medicine

## 2013-10-27 ENCOUNTER — Encounter (HOSPITAL_COMMUNITY): Payer: Self-pay | Admitting: Emergency Medicine

## 2013-10-27 DIAGNOSIS — Z79899 Other long term (current) drug therapy: Secondary | ICD-10-CM | POA: Insufficient documentation

## 2013-10-27 DIAGNOSIS — F17200 Nicotine dependence, unspecified, uncomplicated: Secondary | ICD-10-CM | POA: Insufficient documentation

## 2013-10-27 DIAGNOSIS — R5381 Other malaise: Secondary | ICD-10-CM | POA: Insufficient documentation

## 2013-10-27 DIAGNOSIS — R42 Dizziness and giddiness: Secondary | ICD-10-CM | POA: Insufficient documentation

## 2013-10-27 DIAGNOSIS — L509 Urticaria, unspecified: Secondary | ICD-10-CM | POA: Insufficient documentation

## 2013-10-27 DIAGNOSIS — R1084 Generalized abdominal pain: Secondary | ICD-10-CM | POA: Insufficient documentation

## 2013-10-27 DIAGNOSIS — Z8742 Personal history of other diseases of the female genital tract: Secondary | ICD-10-CM | POA: Insufficient documentation

## 2013-10-27 DIAGNOSIS — R110 Nausea: Secondary | ICD-10-CM | POA: Insufficient documentation

## 2013-10-27 DIAGNOSIS — Z3202 Encounter for pregnancy test, result negative: Secondary | ICD-10-CM | POA: Insufficient documentation

## 2013-10-27 NOTE — ED Notes (Signed)
Pt reports a rash that started this morning to neck and chest. Pt states that she has had this happen to her a month ago. Pt states that she took benadryl this morning and some releif was noted to area. Pt in room scratching entire body and reports being tired more than usual.

## 2013-10-28 LAB — COMPREHENSIVE METABOLIC PANEL
ALT: 9 U/L (ref 0–35)
Albumin: 3.3 g/dL — ABNORMAL LOW (ref 3.5–5.2)
Alkaline Phosphatase: 57 U/L (ref 39–117)
BUN: 19 mg/dL (ref 6–23)
CO2: 24 mEq/L (ref 19–32)
Chloride: 104 mEq/L (ref 96–112)
Creatinine, Ser: 1.04 mg/dL (ref 0.50–1.10)
Potassium: 3.6 mEq/L (ref 3.5–5.1)
Sodium: 136 mEq/L (ref 135–145)
Total Bilirubin: 0.5 mg/dL (ref 0.3–1.2)
Total Protein: 5.6 g/dL — ABNORMAL LOW (ref 6.0–8.3)

## 2013-10-28 LAB — CBC
HCT: 36.6 % (ref 36.0–46.0)
MCH: 32.1 pg (ref 26.0–34.0)
MCHC: 35.2 g/dL (ref 30.0–36.0)
MCV: 91 fL (ref 78.0–100.0)
RBC: 4.02 MIL/uL (ref 3.87–5.11)
RDW: 11.4 % — ABNORMAL LOW (ref 11.5–15.5)

## 2013-10-28 MED ORDER — PREDNISONE 20 MG PO TABS
60.0000 mg | ORAL_TABLET | Freq: Once | ORAL | Status: AC
  Administered 2013-10-28: 60 mg via ORAL
  Filled 2013-10-28: qty 3

## 2013-10-28 MED ORDER — DIPHENHYDRAMINE HCL 50 MG/ML IJ SOLN
25.0000 mg | Freq: Once | INTRAMUSCULAR | Status: AC
  Administered 2013-10-28: 25 mg via INTRAMUSCULAR
  Filled 2013-10-28: qty 1

## 2013-10-28 MED ORDER — FAMOTIDINE 20 MG PO TABS
20.0000 mg | ORAL_TABLET | Freq: Once | ORAL | Status: AC
  Administered 2013-10-28: 20 mg via ORAL
  Filled 2013-10-28: qty 1

## 2013-10-28 MED ORDER — SODIUM CHLORIDE 0.9 % IV BOLUS (SEPSIS)
1000.0000 mL | Freq: Once | INTRAVENOUS | Status: AC
  Administered 2013-10-28: 1000 mL via INTRAVENOUS

## 2013-10-28 MED ORDER — PREDNISONE 20 MG PO TABS: 40.0000 mg | ORAL_TABLET | Freq: Every day | ORAL | Status: DC

## 2013-10-28 MED ORDER — FAMOTIDINE 20 MG PO TABS: 20.0000 mg | ORAL_TABLET | Freq: Two times a day (BID) | ORAL | Status: DC

## 2013-10-28 MED ORDER — SODIUM CHLORIDE 0.9 % IV BOLUS (SEPSIS): 500.0000 mL | Freq: Once | INTRAVENOUS | Status: DC

## 2013-10-28 MED ORDER — DIPHENHYDRAMINE HCL 25 MG PO CAPS: 25.0000 mg | ORAL_CAPSULE | Freq: Four times a day (QID) | ORAL | Status: DC | PRN

## 2013-10-28 NOTE — ED Notes (Signed)
POCT PREG resulted NEG. 

## 2013-10-28 NOTE — ED Notes (Signed)
Pt continues to be dizzy with position changes

## 2013-10-28 NOTE — ED Notes (Signed)
Bed: WA24 Expected date:  Expected time:  Means of arrival:  Comments: 

## 2013-10-28 NOTE — ED Notes (Signed)
Pt ambulated slow to bathroom. Pt continues to appear drowsy but able to ambulate

## 2013-10-28 NOTE — ED Notes (Signed)
88/51, 106/62 p 73 100/63 p 71

## 2013-10-28 NOTE — ED Provider Notes (Signed)
CSN: [id number]     Arrival date & time 10/27/13  2326 History   First MD Initiated Contact with Patient 10/28/13 0005     Chief Complaint  Patient presents with  . Pruritis   (Consider location/radiation/quality/duration/timing/severity/associated sxs/prior Treatment) HPI Comments: Patient is a 32 year old female who presents for rash yesterday morning. Patient states the rash is pruritic and has been spreading over her body since onset. Patient admits to some relief with Benadryl which was last taken at 9 AM yesterday. Patient endorses some associated lightheadedness and nausea with symptoms as well as generalized abdominal cramping. She states she had a similar occurrence 1 month ago which resolved. Denies recent new ingestions, topical contacts, or abx use. Further denies associated fever, CP, SOB, difficulty swallowing, drooling, V/D, numbness/tingling, weakness, and syncope.  The history is provided by the patient. No language interpreter was used.    Past Medical History  Diagnosis Date  . Miscarriage   . Ovarian cyst    Past Surgical History  Procedure Laterality Date  . Wisdom tooth extraction     Family History  Problem Relation Age of Onset  . Other Neg Hx    History  Substance Use Topics  . Smoking status: Current Some Day Smoker    Types: Cigarettes  . Smokeless tobacco: Never Used  . Alcohol Use: Yes   OB History   Grav Para Term Preterm Abortions TAB SAB Ect Mult Living   2 1 1  1  1   1      Review of Systems  Constitutional: Positive for fatigue. Negative for fever.  Gastrointestinal: Positive for abdominal pain (diffuse cramping).  Skin: Positive for rash.  Neurological: Positive for light-headedness.  All other systems reviewed and are negative.   Allergies  Review of patient's allergies indicates no known allergies.  Home Medications   Current Outpatient Rx  Name  Route  Sig  Dispense  Refill  . EPINEPHrine (EPIPEN) 0.3 mg/0.3 mL SOAJ  injection   Intramuscular   Inject 0.3 mLs (0.3 mg total) into the muscle as needed.   1 Device   0   . levonorgestrel (MIRENA) 20 MCG/24HR IUD   Intrauterine   1 each by Intrauterine route continuous.         . diphenhydrAMINE (BENADRYL) 25 mg capsule   Oral   Take 1 capsule (25 mg total) by mouth every 6 (six) hours as needed (itching).   30 capsule   0   . famotidine (PEPCID) 20 MG tablet   Oral   Take 1 tablet (20 mg total) by mouth 2 (two) times daily.   30 tablet   0   . predniSONE (DELTASONE) 20 MG tablet   Oral   Take 2 tablets (40 mg total) by mouth daily.   10 tablet   0    BP 100/63  Pulse 70  Temp(Src) 98.8 F (37.1 C) (Oral)  Resp 16  Ht 5\' 7"  (1.702 m)  Wt 153 lb (69.4 kg)  BMI 23.96 kg/m2  SpO2 94%  LMP 10/27/2013  Physical Exam  Nursing note and vitals reviewed. Constitutional: She is oriented to person, place, and time. She appears well-developed and well-nourished. No distress.  HENT:  Head: Normocephalic and atraumatic.  Mouth/Throat: Oropharynx is clear and moist. No oropharyngeal exudate.  Oral mucosa unremarkable. Airway patent and patient tolerating secretions without difficulty.  Eyes: Conjunctivae and EOM are normal. Pupils are equal, round, and reactive to light. No scleral icterus.  Neck: Normal range of  motion.  Cardiovascular: Normal rate, regular rhythm and normal heart sounds.   Pulmonary/Chest: Effort normal and breath sounds normal. No respiratory distress. She has no wheezes. She has no rales.  Abdominal: Soft. Bowel sounds are normal. She exhibits no distension and no mass. There is tenderness (mild, diffuse). There is no rebound and no guarding.  No peritoneal signs  Musculoskeletal: Normal range of motion.  Neurological: She is alert and oriented to person, place, and time.  GCS 15. Patient moves extremities without ataxia. No gross sensory deficits appreciated.  Skin: Skin is warm and dry. Rash noted. She is not  diaphoretic. No erythema. No pallor.  Scattered blanching erythematous, mildly raised macules that are pruritic. No skin dryness, scaling, or peeling. No bullae, vesicles, or pustules. No weeping or drainage. Urticarial rash visibly present on b/l arms, neck, back and torso.  Psychiatric: She has a normal mood and affect. Her behavior is normal.    ED Course  Procedures (including critical care time) Labs Review Labs Reviewed  COMPREHENSIVE METABOLIC PANEL - Abnormal; Notable for the following:    Glucose, Bld 125 (*)    Calcium 8.0 (*)    Total Protein 5.6 (*)    Albumin 3.3 (*)    GFR calc non Af Amer 71 (*)    GFR calc Af Amer 83 (*)    All other components within normal limits  CBC - Abnormal; Notable for the following:    RDW 11.4 (*)    All other components within normal limits  POCT PREGNANCY, URINE   Imaging Review No results found.  EKG Interpretation   None       MDM   1. Urticaria    32 year old female presents for urticarial rash with onset this morning. Patient well and nontoxic appearing, hemodynamically stable, and afebrile. Airway patent and patient tolerating secretions without difficulty. No angioedema noted and no shortness of breath appreciated. Lungs clear bilaterally. Patient also with diffuse mild abdominal tenderness on physical exam, suspected to be secondary to angioedema; tenderness to the abdomen as well as rash improved after patient given Benadryl, Pepcid, and prednisone. Patient also complaining of lightheadedness and found to be orthostatic. She was rehydrated with 1L IVF which resolved patient's orthostasis.   Patient protecting her airway for entirety of ED stay. Rash nonconcerning for SJS, erythema multiforme major, or erythema multiforme minor. Believe she is stable and appropriate for discharge with referral to allergist for further evaluation of urticarial rash. Return precautions discussed with the patient as well as continued use of  Benadryl, prednisone, and Pepcid for symptomatic management. Patient agreeable to plan with no unaddressed concerns.    Antonietta Breach, PA-C 10/31/13 2045

## 2013-11-03 NOTE — ED Provider Notes (Signed)
Medical screening examination/treatment/procedure(s) were performed by non-physician practitioner and as supervising physician I was immediately available for consultation/collaboration.    Celene Kras, MD 11/03/13 [phone]

## 2014-09-11 ENCOUNTER — Encounter (HOSPITAL_COMMUNITY): Payer: Self-pay | Admitting: Emergency Medicine

## 2014-09-11 ENCOUNTER — Emergency Department (HOSPITAL_COMMUNITY): Payer: Self-pay

## 2014-09-11 ENCOUNTER — Emergency Department (HOSPITAL_COMMUNITY)
Admission: EM | Admit: 2014-09-11 | Discharge: 2014-09-11 | Disposition: A | Discharge status: Home / Self Care | Payer: Self-pay | Attending: Emergency Medicine | Admitting: Emergency Medicine

## 2014-09-11 DIAGNOSIS — R1013 Epigastric pain: Secondary | ICD-10-CM

## 2014-09-11 DIAGNOSIS — Z3202 Encounter for pregnancy test, result negative: Secondary | ICD-10-CM | POA: Insufficient documentation

## 2014-09-11 DIAGNOSIS — K5900 Constipation, unspecified: Secondary | ICD-10-CM | POA: Insufficient documentation

## 2014-09-11 DIAGNOSIS — R062 Wheezing: Secondary | ICD-10-CM | POA: Insufficient documentation

## 2014-09-11 DIAGNOSIS — R112 Nausea with vomiting, unspecified: Secondary | ICD-10-CM

## 2014-09-11 DIAGNOSIS — Z8742 Personal history of other diseases of the female genital tract: Secondary | ICD-10-CM | POA: Insufficient documentation

## 2014-09-11 DIAGNOSIS — K920 Hematemesis: Secondary | ICD-10-CM | POA: Insufficient documentation

## 2014-09-11 DIAGNOSIS — E860 Dehydration: Secondary | ICD-10-CM | POA: Insufficient documentation

## 2014-09-11 DIAGNOSIS — R0602 Shortness of breath: Secondary | ICD-10-CM | POA: Insufficient documentation

## 2014-09-11 DIAGNOSIS — J40 Bronchitis, not specified as acute or chronic: Secondary | ICD-10-CM | POA: Insufficient documentation

## 2014-09-11 DIAGNOSIS — Z79899 Other long term (current) drug therapy: Secondary | ICD-10-CM | POA: Insufficient documentation

## 2014-09-11 DIAGNOSIS — Z720 Tobacco use: Secondary | ICD-10-CM | POA: Insufficient documentation

## 2014-09-11 LAB — CBC WITH DIFFERENTIAL/PLATELET
Basophils Absolute: 0 10*3/uL (ref 0.0–0.1)
Basophils Relative: 0 % (ref 0–1)
Eosinophils Absolute: 0.1 10*3/uL (ref 0.0–0.7)
Eosinophils Relative: 1 % (ref 0–5)
HCT: 43.9 % (ref 36.0–46.0)
HEMOGLOBIN: 15.6 g/dL — AB (ref 12.0–15.0)
LYMPHS PCT: 22 % (ref 12–46)
Lymphs Abs: 1.9 10*3/uL (ref 0.7–4.0)
MCH: 30.2 pg (ref 26.0–34.0)
MCHC: 35.5 g/dL (ref 30.0–36.0)
MCV: 85.1 fL (ref 78.0–100.0)
MONOS PCT: 7 % (ref 3–12)
Monocytes Absolute: 0.6 10*3/uL (ref 0.1–1.0)
NEUTROS ABS: 6.1 10*3/uL (ref 1.7–7.7)
NEUTROS PCT: 70 % (ref 43–77)
Platelets: 242 10*3/uL (ref 150–400)
RBC: 5.16 MIL/uL — AB (ref 3.87–5.11)
RDW: 11.8 % (ref 11.5–15.5)
WBC: 8.7 10*3/uL (ref 4.0–10.5)

## 2014-09-11 LAB — COMPREHENSIVE METABOLIC PANEL
ALK PHOS: 101 U/L (ref 39–117)
ALT: 161 U/L — ABNORMAL HIGH (ref 0–35)
AST: 73 U/L — AB (ref 0–37)
Albumin: 4.3 g/dL (ref 3.5–5.2)
Anion gap: 16 — ABNORMAL HIGH (ref 5–15)
BILIRUBIN TOTAL: 0.8 mg/dL (ref 0.3–1.2)
BUN: 14 mg/dL (ref 6–23)
CHLORIDE: 106 meq/L (ref 96–112)
CO2: 21 meq/L (ref 19–32)
CREATININE: 0.84 mg/dL (ref 0.50–1.10)
Calcium: 9.3 mg/dL (ref 8.4–10.5)
GFR calc Af Amer: >90 mL/min (ref >90)
Glucose, Bld: 114 mg/dL — ABNORMAL HIGH (ref 70–99)
POTASSIUM: 4.2 meq/L (ref 3.7–5.3)
Sodium: 143 mEq/L (ref 137–147)
Total Protein: 7.6 g/dL (ref 6.0–8.3)

## 2014-09-11 LAB — URINALYSIS, ROUTINE W REFLEX MICROSCOPIC
BILIRUBIN URINE: NEGATIVE
Glucose, UA: NEGATIVE mg/dL
HGB URINE DIPSTICK: NEGATIVE
KETONES UR: 15 mg/dL — AB
NITRITE: NEGATIVE
Protein, ur: 30 mg/dL — AB
SPECIFIC GRAVITY, URINE: 1.037 — AB (ref 1.005–1.030)
Urobilinogen, UA: 1 mg/dL (ref 0.0–1.0)
pH: 7.5 (ref 5.0–8.0)

## 2014-09-11 LAB — URINE MICROSCOPIC-ADD ON

## 2014-09-11 LAB — LIPASE, BLOOD: LIPASE: 41 U/L (ref 11–59)

## 2014-09-11 LAB — POC URINE PREG, ED: Preg Test, Ur: NEGATIVE

## 2014-09-11 MED ORDER — GUAIFENESIN ER 600 MG PO TB12: 600.0000 mg | ORAL_TABLET | Freq: Two times a day (BID) | ORAL | Status: DC | PRN

## 2014-09-11 MED ORDER — RANITIDINE HCL 150 MG PO TABS: 150.0000 mg | ORAL_TABLET | Freq: Two times a day (BID) | ORAL | Status: DC

## 2014-09-11 MED ORDER — MORPHINE SULFATE 2 MG/ML IJ SOLN
2.0000 mg | Freq: Once | INTRAMUSCULAR | Status: AC
  Administered 2014-09-11: 2 mg via INTRAVENOUS
  Filled 2014-09-11: qty 1

## 2014-09-11 MED ORDER — LORAZEPAM 1 MG PO TABS: 1.0000 mg | ORAL_TABLET | Freq: Three times a day (TID) | ORAL | Status: DC | PRN

## 2014-09-11 MED ORDER — HYDROMORPHONE HCL 1 MG/ML IJ SOLN
0.5000 mg | Freq: Once | INTRAMUSCULAR | Status: AC
  Administered 2014-09-11: 0.5 mg via INTRAVENOUS
  Filled 2014-09-11: qty 1

## 2014-09-11 MED ORDER — IPRATROPIUM BROMIDE 0.02 % IN SOLN
0.5000 mg | Freq: Once | RESPIRATORY_TRACT | Status: AC
  Administered 2014-09-11: 0.5 mg via RESPIRATORY_TRACT
  Filled 2014-09-11: qty 2.5

## 2014-09-11 MED ORDER — ALBUTEROL SULFATE (2.5 MG/3ML) 0.083% IN NEBU
5.0000 mg | INHALATION_SOLUTION | Freq: Once | RESPIRATORY_TRACT | Status: AC
  Administered 2014-09-11: 5 mg via RESPIRATORY_TRACT
  Filled 2014-09-11: qty 6

## 2014-09-11 MED ORDER — ALBUTEROL SULFATE HFA 108 (90 BASE) MCG/ACT IN AERS: 2.0000 | INHALATION_SPRAY | RESPIRATORY_TRACT | Status: DC | PRN

## 2014-09-11 MED ORDER — PROMETHAZINE HCL 25 MG PO TABS: 25.0000 mg | ORAL_TABLET | Freq: Four times a day (QID) | ORAL | Status: DC | PRN

## 2014-09-11 MED ORDER — DIPHENHYDRAMINE HCL 50 MG/ML IJ SOLN
25.0000 mg | Freq: Once | INTRAMUSCULAR | Status: AC
  Administered 2014-09-11: 25 mg via INTRAVENOUS
  Filled 2014-09-11: qty 1

## 2014-09-11 MED ORDER — ONDANSETRON HCL 4 MG/2ML IJ SOLN
4.0000 mg | Freq: Once | INTRAMUSCULAR | Status: AC
  Administered 2014-09-11: 4 mg via INTRAVENOUS
  Filled 2014-09-11: qty 2

## 2014-09-11 MED ORDER — FENTANYL CITRATE 0.05 MG/ML IJ SOLN
50.0000 ug | Freq: Once | INTRAMUSCULAR | Status: AC
  Administered 2014-09-11: 50 ug via INTRAVENOUS
  Filled 2014-09-11: qty 2

## 2014-09-11 MED ORDER — PROMETHAZINE HCL 25 MG/ML IJ SOLN
25.0000 mg | Freq: Once | INTRAMUSCULAR | Status: AC
  Administered 2014-09-11: 25 mg via INTRAVENOUS
  Filled 2014-09-11: qty 1

## 2014-09-11 MED ORDER — PREDNISONE 20 MG PO TABS: ORAL_TABLET | ORAL | Status: DC

## 2014-09-11 MED ORDER — METOCLOPRAMIDE HCL 5 MG/ML IJ SOLN
10.0000 mg | Freq: Once | INTRAMUSCULAR | Status: AC
  Administered 2014-09-11: 10 mg via INTRAVENOUS
  Filled 2014-09-11: qty 2

## 2014-09-11 MED ORDER — SODIUM CHLORIDE 0.9 % IV BOLUS (SEPSIS)
1000.0000 mL | Freq: Once | INTRAVENOUS | Status: AC
  Administered 2014-09-11: 1000 mL via INTRAVENOUS

## 2014-09-11 MED ORDER — AEROCHAMBER PLUS W/MASK MISC: Status: DC

## 2014-09-11 MED ORDER — GI COCKTAIL ~~LOC~~
30.0000 mL | Freq: Once | ORAL | Status: AC
  Administered 2014-09-11: 30 mL via ORAL
  Filled 2014-09-11: qty 30

## 2014-09-11 MED ORDER — LORAZEPAM 2 MG/ML IJ SOLN
1.0000 mg | Freq: Once | INTRAMUSCULAR | Status: AC
  Administered 2014-09-11: 1 mg via INTRAVENOUS
  Filled 2014-09-11: qty 1

## 2014-09-11 MED ORDER — PROMETHAZINE HCL 25 MG RE SUPP: 25.0000 mg | Freq: Four times a day (QID) | RECTAL | Status: DC | PRN

## 2014-09-11 NOTE — ED Notes (Signed)
Pt tolerates GI cocktail, ice water, and ambulation. PA notified.

## 2014-09-11 NOTE — ED Notes (Signed)
Respiratory and PA aware of pt current respiratory status.

## 2014-09-11 NOTE — Discharge Instructions (Signed)
Use phenergan as prescribed, as needed for nausea, and ativan as needed for severe nausea/vomiting. Stay well hydrated with small sips of fluids throughout the day, avoid harsh foods that irritate your stomach. Increase fiber in your diet and use miralax for constipation. Use zantac to help soothe your stomach, or try over the counter pepto bismol. Use tylenol for pain. Use inhalers as directed as well as mucinex and steam to help loosen phlegm and with any wheezing/shortness of breath. Follow up with  and wellness for ongoing medical care, make an appointment for one week from now. Return to ER for changing or worsening of symptoms.   Nausea and Vomiting Nausea means you feel sick to your stomach. Throwing up (vomiting) is a reflex where stomach contents come out of your mouth. HOME CARE   Take medicine as told by your doctor.  Do not force yourself to eat. However, you do need to drink fluids.  If you feel like eating, eat a normal diet as told by your doctor.  Eat rice, wheat, potatoes, bread, lean meats, yogurt, fruits, and vegetables.  Avoid high-fat foods.  Drink enough fluids to keep your pee (urine) clear or pale yellow.  Ask your doctor how to replace body fluid losses (rehydrate). Signs of body fluid loss (dehydration) include:  Feeling very thirsty.  Dry lips and mouth.  Feeling dizzy.  Dark pee.  Peeing less than normal.  Feeling confused.  Fast breathing or heart rate. GET HELP RIGHT AWAY IF:   You have blood in your throw up.  You have black or bloody poop (stool).  You have a bad headache or stiff neck.  You feel confused.  You have bad belly (abdominal) pain.  You have chest pain or trouble breathing.  You do not pee at least once every 8 hours.  You have cold, clammy skin.  You keep throwing up after 24 to 48 hours.  You have a fever. MAKE SURE YOU:   Understand these instructions.  Will watch your condition.  Will get help  right away if you are not doing well or get worse. Document Released: 05/09/2008 Document Revised: 02/13/2012 Document Reviewed: 04/22/2011 [facility] Patient Information 2015 [place], [place]. This information is not intended to replace advice given to you by your health care provider. Make sure you discuss any questions you have with your health care provider.  Mallory-Weiss Syndrome Mallory-Weiss syndrome refers to bleeding from tears in the lining of the esophagus near where it meets the stomach. This is often caused by forceful vomiting, retching or coughing. This condition is often associated with alcoholism. Usually the bleeding stops by itself after 24 to 48 hours. Sometimes endoscopic or surgical treatment is needed. This condition is not usually fatal. SYMPTOMS  Vomiting of bright red or black coffee ground like material.  Black, tarry stools.  Low blood pressure causing you to feel faint or experience loss of consciousness. DIAGNOSIS  Definitive diagnosis is by endoscopy. Treatment is usually supportive. Persistent bleeding is uncommon. Sometimes cauterization or injection of epinephrine to stop the bleeding is used during the diagnostic endoscopy. Embolization (obstruction) of the arteries supplying the area of bleeding is sometimes used to stop the bleeding.  An NG tube (naso-gastric tube) may be inserted to determine where the bleeding is coming from.  Often an EGD (esophagogastroduodenoscopy) is done. In this procedure there is a small flexible tube-like telescope (endoscope) put into your mouth, through your esophagus (the food tube leading from your mouth to your stomach),  down into your stomach and into the small bowel. Through this your caregiver can see what and where the problem is. TREATMENT  It is necessary to stop the bleeding as soon as possible. During the EGD, your caregiver may inject medication into bleeding vessels to clot them.  SEEK IMMEDIATE MEDICAL CARE IF:  You  have persistent dizziness, lightheadedness, or fainting.  Your vomiting returns and you have blood in your stools.  You have vomit that is bright red blood or black coffee ground-like blood, bright red blood in the stool or black tarry stools.  You have chest pain.  You cannot eat or drink.  You have nausea or vomiting. MAKE SURE YOU:   Understand these instructions.  Will watch your condition.  Will get help right away if you are not doing well or get worse. Document Released: 04/10/2006 Document Revised: 02/13/2012 Document Reviewed: 01/15/2014 [facility] Patient Information 2015 [place], [place]. This information is not intended to replace advice given to you by your health care provider. Make sure you discuss any questions you have with your health care provider.  Rehydration, Adult Rehydration is the replacement of body fluids lost during dehydration. Dehydration is an extreme loss of body fluids to the point of body function impairment. There are many ways extreme fluid loss can occur, including vomiting, diarrhea, or excess sweating. Recovering from dehydration requires replacing lost fluids, continuing to eat to maintain strength, and avoiding foods and beverages that may contribute to further fluid loss or may increase nausea. HOW TO REHYDRATE In most cases, rehydration involves the replacement of not only fluids but also carbohydrates and basic body salts. Rehydration with an oral rehydration solution is one way to replace essential nutrients lost through dehydration. An oral rehydration solution can be purchased at pharmacies, retail stores, and online. Premixed packets of powder that you combine with water to make a solution are also sold. You can prepare an oral rehydration solution at home by mixing the following ingredients together:    - tsp table salt.   tsp baking soda.   tsp salt substitute containing potassium chloride.  1 tablespoons sugar.  1 L (34 oz) of  water. Be sure to use exact measurements. Including too much sugar can make diarrhea worse. Drink -1 cup (120-240 mL) of oral rehydration solution each time you have diarrhea or vomit. If drinking this amount makes your vomiting worse, try drinking smaller amounts more often. For example, drink 1-3 tsp every 5-10 minutes.  A general rule for staying hydrated is to drink 1-2 L of fluid per day. Talk to your caregiver about the specific amount you should be drinking each day. Drink enough fluids to keep your urine clear or pale yellow. EATING WHEN DEHYDRATED Even if you have had severe sweating or you are having diarrhea, do not stop eating. Many healthy items in a normal diet are okay to continue eating while recovering from dehydration. The following tips can help you to lessen nausea when you eat:  Ask someone else to prepare your food. Cooking smells may worsen nausea.  Eat in a well-ventilated room away from cooking smells.  Sit up when you eat. Avoid lying down until 1-2 hours after eating.  Eat small amounts when you eat.  Eat foods that are easy to digest. These include soft, well-cooked, or mashed foods. FOODS AND BEVERAGES TO AVOID Avoid eating or drinking the following foods and beverages that may increase nausea or further loss of fluid:   Fruit juices with a  high sugar content, such as concentrated juices.  Alcohol.  Beverages containing caffeine.  Carbonated drinks. They may cause a lot of gas.  Foods that may cause a lot of gas, such as cabbage, broccoli, and beans.  Fatty, greasy, and fried foods.  Spicy, very salty, and very sweet foods or drinks.  Foods or drinks that are very hot or very cold. Consume food or drinks at or near room temperature.  Foods that need a lot of chewing, such as raw vegetables.  Foods that are sticky or hard to swallow, such as peanut butter. Document Released: 02/13/2012 Document Revised: 08/15/2012 Document Reviewed:  02/13/2012 [facility] Patient Information 2015 [place], [place]. This information is not intended to replace advice given to you by your health care provider. Make sure you discuss any questions you have with your health care provider.  How to Use an Inhaler Proper inhaler technique is very important. Good technique ensures that the medicine reaches the lungs. Poor technique results in depositing the medicine on the tongue and back of the throat rather than in the airways. If you do not use the inhaler with good technique, the medicine will not help you. STEPS TO FOLLOW IF USING AN INHALER WITHOUT AN EXTENSION TUBE 1. Remove the cap from the inhaler. 2. If you are using the inhaler for the first time, you will need to prime it. Shake the inhaler for 5 seconds and release four puffs into the air, away from your face. Ask your health care provider or pharmacist if you have questions about priming your inhaler. 3. Shake the inhaler for 5 seconds before each breath in (inhalation). 4. Position the inhaler so that the top of the canister faces up. 5. Put your index finger on the top of the medicine canister. Your thumb supports the bottom of the inhaler. 6. Open your mouth. 7. Either place the inhaler between your teeth and place your lips tightly around the mouthpiece, or hold the inhaler 1-2 inches away from your open mouth. If you are unsure of which technique to use, ask your health care provider. 8. Breathe out (exhale) normally and as completely as possible. 9. Press the canister down with your index finger to release the medicine. 10. At the same time as the canister is pressed, inhale deeply and slowly until your lungs are completely filled. This should take 4-6 seconds. Keep your tongue down. 11. Hold the medicine in your lungs for 5-10 seconds (10 seconds is best). This helps the medicine get into the small airways of your lungs. 12. Breathe out slowly, through pursed lips. Whistling is an example  of pursed lips. 13. Wait at least 15-30 seconds between puffs. Continue with the above steps until you have taken the number of puffs your health care provider has ordered. Do not use the inhaler more than your health care provider tells you. 14. Replace the cap on the inhaler. 15. Follow the directions from your health care provider or the inhaler insert for cleaning the inhaler. STEPS TO FOLLOW IF USING AN INHALER WITH AN EXTENSION (SPACER) 1. Remove the cap from the inhaler. 2. If you are using the inhaler for the first time, you will need to prime it. Shake the inhaler for 5 seconds and release four puffs into the air, away from your face. Ask your health care provider or pharmacist if you have questions about priming your inhaler. 3. Shake the inhaler for 5 seconds before each breath in (inhalation). 4. Place the open end of  the spacer onto the mouthpiece of the inhaler. 5. Position the inhaler so that the top of the canister faces up and the spacer mouthpiece faces you. 6. Put your index finger on the top of the medicine canister. Your thumb supports the bottom of the inhaler and the spacer. 7. Breathe out (exhale) normally and as completely as possible. 8. Immediately after exhaling, place the spacer between your teeth and into your mouth. Close your lips tightly around the spacer. 9. Press the canister down with your index finger to release the medicine. 10. At the same time as the canister is pressed, inhale deeply and slowly until your lungs are completely filled. This should take 4-6 seconds. Keep your tongue down and out of the way. 11. Hold the medicine in your lungs for 5-10 seconds (10 seconds is best). This helps the medicine get into the small airways of your lungs. Exhale. 12. Repeat inhaling deeply through the spacer mouthpiece. Again hold that breath for up to 10 seconds (10 seconds is best). Exhale slowly. If it is difficult to take this second deep breath through the spacer,  breathe normally several times through the spacer. Remove the spacer from your mouth. 13. Wait at least 15-30 seconds between puffs. Continue with the above steps until you have taken the number of puffs your health care provider has ordered. Do not use the inhaler more than your health care provider tells you. 14. Remove the spacer from the inhaler, and place the cap on the inhaler. 15. Follow the directions from your health care provider or the inhaler insert for cleaning the inhaler and spacer. If you are using different kinds of inhalers, use your quick relief medicine to open the airways 10-15 minutes before using a steroid if instructed to do so by your health care provider. If you are unsure which inhalers to use and the order of using them, ask your health care provider, nurse, or respiratory therapist. If you are using a steroid inhaler, always rinse your mouth with water after your last puff, then gargle and spit out the water. Do not swallow the water. AVOID:  Inhaling before or after starting the spray of medicine. It takes practice to coordinate your breathing with triggering the spray.  Inhaling through the nose (rather than the mouth) when triggering the spray. HOW TO DETERMINE IF YOUR INHALER IS FULL OR NEARLY EMPTY You cannot know when an inhaler is empty by shaking it. A few inhalers are now being made with dose counters. Ask your health care provider for a prescription that has a dose counter if you feel you need that extra help. If your inhaler does not have a counter, ask your health care provider to help you determine the date you need to refill your inhaler. Write the refill date on a calendar or your inhaler canister. Refill your inhaler 7-10 days before it runs out. Be sure to keep an adequate supply of medicine. This includes making sure it is not expired, and that you have a spare inhaler.  SEEK MEDICAL CARE IF:   Your symptoms are only partially relieved with your  inhaler.  You are having trouble using your inhaler.  You have some increase in phlegm. SEEK IMMEDIATE MEDICAL CARE IF:   You feel little or no relief with your inhalers. You are still wheezing and are feeling shortness of breath or tightness in your chest or both.  You have dizziness, headaches, or a fast heart rate.  You have chills, fever,  or night sweats.  You have a noticeable increase in phlegm production, or there is blood in the phlegm. MAKE SURE YOU:   Understand these instructions.  Will watch your condition.  Will get help right away if you are not doing well or get worse. Document Released: 11/18/2000 Document Revised: 09/11/2013 Document Reviewed: 06/20/2013 [facility] Patient Information 2015 [place], [place]. This information is not intended to replace advice given to you by your health care provider. Make sure you discuss any questions you have with your health care provider.  Cough, Adult  A cough is a reflex. It helps you clear your throat and airways. A cough can help heal your body. A cough can last 2 or 3 weeks (acute) or may last more than 8 weeks (chronic). Some common causes of a cough can include an infection, allergy, or a cold. HOME CARE  Only take medicine as told by your doctor.  If given, take your medicines (antibiotics) as told. Finish them even if you start to feel better.  Use a cold steam vaporizer or humidifier in your home. This can help loosen thick spit (secretions).  Sleep so you are almost sitting up (semi-upright). Use pillows to do this. This helps reduce coughing.  Rest as needed.  Stop smoking if you smoke. GET HELP RIGHT AWAY IF:  You have yellowish-white fluid (pus) in your thick spit.  Your cough gets worse.  Your medicine does not reduce coughing, and you are losing sleep.  You cough up blood.  You have trouble breathing.  Your pain gets worse and medicine does not help.  You have a fever. MAKE SURE YOU:    Understand these instructions.  Will watch your condition.  Will get help right away if you are not doing well or get worse. Document Released: 08/04/2011 Document Revised: 04/07/2014 Document Reviewed: 08/04/2011 [facility] Patient Information 2015 [place]. This information is not intended to replace advice given to you by your health care provider. Make sure you discuss any questions you have with your health care provider.  Emergency Department Resource Guide 1) Find a Doctor and Pay Out of Pocket Although you won't have to find out who is covered by your insurance plan, it is a good idea to ask around and get recommendations. You will then need to call the office and see if the doctor you have chosen will accept you as a new patient and what types of options they offer for patients who are self-pay. Some doctors offer discounts or will set up payment plans for their patients who do not have insurance, but you will need to ask so you aren't surprised when you get to your appointment.  2) Contact Your Local Health Department Not all health departments have doctors that can see patients for sick visits, but many do, so it is worth a call to see if yours does. If you don't know where your local health department is, you can check in your phone book. The CDC also has a tool to help you locate your state's health department, and many state websites also have listings of all of their local health departments.  3) Find a Walk-in Clinic If your illness is not likely to be very severe or complicated, you may want to try a walk in clinic. These are popping up all over the country in pharmacies, drugstores, and shopping centers. They're usually staffed by nurse practitioners or physician assistants that have been trained to treat common illnesses and complaints. They're usually  fairly quick and inexpensive. However, if you have serious medical issues or chronic medical problems, these are probably  not your best option.  No Primary Care Doctor: - Call Health Connect at  [phone] - they can help you locate a primary care doctor that  accepts your insurance, provides certain services, etc. - Physician Referral Service- [phone]  Chronic Pain Problems: Organization         Address  Phone   Notes  [facility]  [phone] Patients need to be referred by their primary care doctor.   Medication Assistance: Organization         Address  Phone   Notes  [facility] Medication [facility] [address] [place] [place], [place] [address] [phone] --Must be a resident of [facility] -- Must have NO insurance coverage whatsoever (no Medicaid/ Medicare, etc.) -- The pt. MUST have a primary care doctor that directs their care regularly and follows them in the community   [facility]  [phone]   [employer]  [phone]    Agencies that provide inexpensive medical care: Organization         Address  Phone   Notes  [facility]  [phone]   [facility]    [phone]   [facility] [address]ts [place], [place] [address] [phone]   [facility] [address] [place] [phone]   [facility]anned Parenthood    [phone]   [facility]    [phone]   [facility]  [address], [place] Phone:  [phone], Fax:  [phone] Hours of Operation:  9 am - 6 pm, M-F.  Also accepts Medicaid/Medicare and self-pay.  [facility]  [address], [place] Phone: [phone], Fax: [phone]. Hours of Operation:  8:30 am - 5:30 pm, M-F.  Also accepts Medicaid and self-pay.  [facility] [place] [address] [place] Phone: [phone]   [facility] [address] Natasha Bence [place], [place] [phone], Ext. [phone] Mondays & Thursdays: 7-9 AM.   First 15 patients are seen on a first come, first serve basis.    Medicaid-accepting [facility] Providers:  Organization         Address  Phone   Notes  [facility] [address], [place] [phone] Also accepts self-pay patients.  [facility] [address] Laurell Josephs [place], [place]  [phone]   [facility] [address], [place] [phone]   [facility] [address], [place] [phone]   Renaye Rakers [address], [place]   [phone] Only accepts [place] patients after they have their name applied to their card.   Self-Pay (no insurance) in [facility]:  Organization         Address  Phone   Notes  Sickle Cell Patients, [facility] [address] [place], [place] [phone]   [facility] [address] [place], [place] [phone]   [facility] [place]  [address], [place] [phone]   [facility]/Dr. Osei-Bonsu  [address] [place] or [address], [place] [phone] Phone  number for both [place] and [place] locations is the same.  [facility] and [facility] [address] [place] [phone]   [facility] [address], [place] or [address] [phone]   [facility] [address] [place] [phone], phone; [phone], fax Sees patients 1st and 3rd Saturday of every month.  Must not qualify for public or private insurance (i.e. Medicaid, Medicare, [place] Health Choice, Veterans' Benefits)  Household income should be no more than 200% of the poverty level The clinic cannot treat you if you are pregnant or think you are pregnant  Sexually transmitted diseases are not treated at the clinic.    Dental  Care: Organization         Address  Phone  Notes  [facility] Department of [facility] [address] Venneman [place], [place] [phone] Accepts children up to age 51 who are enrolled in [place] or [place] Health Choice; pregnant women with a Medicaid card; and children who have applied for Medicaid or [place] Health Choice, but were declined, whose parents can pay a reduced fee at time of service.  [facility] Department of [facility]  [address], [place] [phone] Accepts children up to age 69 who are enrolled in [place] or [place] Health Choice; pregnant women with a Medicaid card; and children who have applied for Medicaid or [place] Health Choice, but were declined, whose parents can pay a reduced fee at time of service.  [facility]  [address] [place], [place] [phone] Patients are seen by appointment only. Walk-ins are not accepted. [facility] will see patients 51 years of age and older. Monday - Tuesday (8am-5pm) Most Wednesdays (8:30-5pm) $30 per visit, cash only  [facility] Adult [facility]  [address], [facility] [phone] Patients are seen by appointment only. Walk-ins are not accepted. [facility] will see patients 42 years of age and older. One Wednesday Evening (Monthly: Volunteer Based).  $30 per visit, cash only  [employer]  [phone] for adults; Children under age 30, call Graduate Pediatric Dentistry at [phone]. Children aged 39-14, please call [phone] to request a pediatric application.  Dental services are provided in all areas of dental care including fillings, crowns and bridges, complete and partial dentures, implants, gum treatment, root canals, and extractions. Preventive care is also provided. Treatment is provided to both adults and children. Patients are selected via a lottery and there is often a waiting list.   [facility] [address], [place]  [phone] [email or web address]   [facility] [address] [place], [place] [phone], Ext. [phone] Second and Fourth Thursday of each month, opens at 6:30 AM; Clinic ends at 9 AM.  Patients are seen on a first-come first-served basis, and a limited number are seen during each clinic.   [facility]  [address] [place], [place] [phone]   Eligibility Requirements You must have lived in [place], [place], or [place] counties for at least the last three months.   You cannot be eligible for state or federal sponsored [place], including [employer], [place], or [employer].   You generally cannot be eligible for healthcare insurance through your employer.    How to apply: Eligibility screenings are held every Tuesday and Wednesday afternoon from 1:00 pm  until 4:00 pm. You do not need an appointment for the interview!  [facility] [address] [place], [place] [phone]   [facility] Health Department  [phone]   [facility] Health Department  [phone]   [facility] Health Department  [phone]    Behavioral Health Resources in the Community: Intensive Outpatient Programs Organization         Address  Phone  Notes  [facility] [address] [place], [place] [phone]   [facility] Outpatient [address] [place], [place] [phone]   [facility] [address] [place], [place]  [phone]   [facility] [address]  [place], [place] [phone] or [phone]   Substance Abuse Resources Organization         Address  Phone  Notes  [facility]  [phone]   [facility]  [phone]   [facility] [place]  [phone]   Floydene Flock  [phone]   Residential & Outpatient Substance Abuse Program  [phone]    Psychological Services Organization         Address  Phone  Notes  [facility]  [phone]   [facility]  [phone]   [facility] Mental Health [address] [place] [phone] or [phone]    Mobile Crisis Teams Organization         Address  Phone  Notes  [facility], Mobile Crisis Care Unit  [phone]   [facility]  [address] [place], [place] [phone]   Doristine Locks [address] [place] [phone]    Self-Help/Support Groups Organization         Address  Phone             Notes  [facility] [place] - variety of support groups  [phone]- [phone] Call for more information  Narcotics Anonymous (NA), [facility] [address], [employer] [place]  2 meetings at this location   Statistician         Address  Phone  Notes  [facility] [address],    [place]  [phone]   [facility]  [address], [place] [address], [place], [place] [phone]   [facility] [address] [place], [place] [phone] Admissions: 8am-3pm M-F  [facility] [address]    [place], [place] [phone]   [facility] [address] [place], [place], [place] [phone]   [facility] [address]  [place], [place] [phone]   [facility] - Intensive Outpatient [address], [place], [place] [phone]   [facility] [address]  [place], [place] [phone] or [phone]   [facility] ([facility] [address], [place], [place] [phone] Accepts Medicaid  [facility] [address],  [place] [phone] Substance Abuse/Addiction Treatment   [facility] Organization         Address  Phone  Notes  [facility]  [phone]   Angie Fava, PhD [address] [place], [place]   [phone] or [phone]   [facility]   [address] [place], [place] [phone]   [facility] [address] [place], [place] [phone] Insurance/Medicaid/sponsorship through [employer] and Families [address]                                    [place], [place] [phone] Therapy/tele-psych/case  °[facility] [address]  ° [place] [phone]    °Dr. Arfeen  [phone]   °[facility] [place]  [employer] [place] [facility] 1) [address],  °2) [address], [place] °3)  [address], [place] [phone] °[phone] ° °[phone]   °[place] Child Abuse Hotline [phone]) [phone] or [phone] (After Hours)    ° ° ° °

## 2014-09-11 NOTE — ED Provider Notes (Signed)
CSN: [id number]     Arrival date & time 09/11/14  0547 History   First MD Initiated Contact with Patient 09/11/14 [phone]     Chief Complaint  Patient presents with  . Emesis     (Consider location/radiation/quality/duration/timing/severity/associated sxs/prior Treatment) HPI Comments: Debbie Griffin is a 33 y.o. female with a PMHx of opiate abuse recently in detox facility x5 days, miscarriage, and ovarian cyst, as well as a prior episode of epigastric pain with no known etiology found after EGD, who presents to the ED with complaints of epigastric pain that developed suddenly at 10pm last night. Pt states the pain is 10/10 squeezing constant nonradiating with no known aggravating or alleviating factors and has not tried anything for it. Associated symptoms include N/V which contains darkish reddish emesis and stomach contents. Pt states she's had this before and EGD was done with no discovered cause. Pt also states she's had a cold x 3 days that she developed while in detox. Her symptoms include hacking cough unrelieved by cough syrup, and SOB related to wheezing. No hx of asthma. Last opiate use was last week prior to entering detox. Denies fevers, chills, rhinorrhea, sinus congestion, sore throat, CP, stridor, diarrhea, constipation, obstipation, hematochezia, melena, dysuria, hematuria, EtOH use, NSAID use, recent antibiotics, prior abd surgeries, vaginal symptoms, myalgias or arthralgias. Denies back pain.   Patient is a 33 y.o. female presenting with abdominal pain. The history is provided by the patient. No language interpreter was used.  Abdominal Pain Pain location:  Epigastric Pain quality: squeezing   Pain radiates to:  Does not radiate Pain severity:  Severe (10/10) Onset quality:  Gradual Duration:  8 hours Timing:  Constant Progression:  Waxing and waning Chronicity:  Recurrent (has had one prior episode similar to this) Context: recent illness and sick contacts   Context: not  alcohol use, not recent travel and not suspicious food intake   Relieved by:  None tried Worsened by:  Nothing tried Ineffective treatments:  None tried Associated symptoms: cough, hematemesis, nausea, shortness of breath and vomiting   Associated symptoms: no chest pain, no chills, no constipation, no diarrhea, no dysuria, no fever, no flatus, no hematochezia, no hematuria, no melena, no sore throat, no vaginal bleeding and no vaginal discharge   Cough:    Cough characteristics:  Hacking   Sputum characteristics:  Unable to specify   Severity:  Moderate   Onset quality:  Gradual   Duration:  3 days   Timing:  Constant   Progression:  Unchanged   Chronicity:  New Nausea:    Severity:  Moderate   Onset quality:  Gradual   Duration:  8 hours   Timing:  Constant   Progression:  Unchanged Shortness of breath:    Severity:  Moderate   Onset quality:  Gradual   Duration:  3 days   Timing:  Constant   Progression:  Unchanged Vomiting:    Quality:  Stomach contents and bright red blood (maroonish colored blood)   Number of occurrences:  15 episodes since last night   Severity:  Severe   Duration:  8 hours   Timing:  Constant   Progression:  Unchanged Risk factors: recent hospitalization (recently in detox facility)   Risk factors: no alcohol abuse and no NSAID use     Past Medical History  Diagnosis Date  . Miscarriage   . Ovarian cyst    Past Surgical History  Procedure Laterality Date  . Wisdom tooth extraction    .  Esophagogastroduodenoscopy     Family History  Problem Relation Age of Onset  . Other Neg Hx    History  Substance Use Topics  . Smoking status: Current Some Day Smoker    Types: Cigarettes  . Smokeless tobacco: Never Used  . Alcohol Use: Yes     Comment: rarely   OB History   Grav Para Term Preterm Abortions TAB SAB Ect Mult Living   2 1 1  1  1   1      Review of Systems  Constitutional: Negative for fever and chills.  HENT: Negative for  rhinorrhea, sinus pressure and sore throat.   Respiratory: Positive for cough, shortness of breath and wheezing. Negative for chest tightness.   Cardiovascular: Negative for chest pain.  Gastrointestinal: Positive for nausea, vomiting, abdominal pain and hematemesis. Negative for diarrhea, constipation, blood in stool, melena, hematochezia, abdominal distention and flatus.  Genitourinary: Negative for dysuria, urgency, hematuria, flank pain, decreased urine volume, vaginal bleeding and vaginal discharge.  Musculoskeletal: Negative for arthralgias and myalgias.  Skin: Negative for color change.  Neurological: Negative for dizziness, weakness, light-headedness and numbness.  Psychiatric/Behavioral: Negative for confusion.    10 Systems reviewed and are negative for acute change except as noted in the HPI.   Allergies  Review of patient's allergies indicates no known allergies.  Home Medications   Prior to Admission medications   Medication Sig Start Date End Date Taking? Authorizing Provider  diphenhydrAMINE (BENADRYL) 25 mg capsule Take 1 capsule (25 mg total) by mouth every 6 (six) hours as needed (itching). 10/28/13   Antony Madura, PA-C  EPINEPHrine (EPIPEN) 0.3 mg/0.3 mL SOAJ injection Inject 0.3 mLs (0.3 mg total) into the muscle as needed. 09/20/13   Tiffany Irine Seal, PA-C  famotidine (PEPCID) 20 MG tablet Take 1 tablet (20 mg total) by mouth 2 (two) times daily. 10/28/13   Antony Madura, PA-C  levonorgestrel (MIRENA) 20 MCG/24HR IUD 1 each by Intrauterine route continuous.    Historical Provider, MD  predniSONE (DELTASONE) 20 MG tablet Take 2 tablets (40 mg total) by mouth daily. 10/28/13   Antony Madura, PA-C   BP 125/96  Pulse 63  Temp(Src) 98.8 F (37.1 C) (Oral)  Resp 16  Ht 5\' 7"  (1.702 m)  Wt 144 lb (65.318 kg)  BMI 22.55 kg/m2  SpO2 99% Physical Exam  Nursing note and vitals reviewed. Constitutional: She is oriented to person, place, and time. Vital signs are normal. She  appears well-developed and well-nourished.  Non-toxic appearance. She appears distressed.  Afebrile, nontoxic, but appears uncomfortable and in pain, curled into fetal position and crying  HENT:  Head: Normocephalic and atraumatic.  Mouth/Throat: Uvula is midline and oropharynx is clear and moist. Mucous membranes are dry. No trismus in the jaw. No uvula swelling.  Mildly dry mucous membranes  Eyes: Conjunctivae and EOM are normal. Right eye exhibits no discharge. Left eye exhibits no discharge.  Neck: Normal range of motion. Neck supple.  Cardiovascular: Normal rate, regular rhythm, normal heart sounds and intact distal pulses.  Exam reveals no gallop and no friction rub.   No murmur heard. Pulmonary/Chest: Effort normal. No respiratory distress. She has no decreased breath sounds. She has wheezes. She has rhonchi. She has no rales.  Diffuse inspiratory and expiratory wheezing and rhonchi throughout all lung fields, oxygenating at 99% on RA, no increased work of breathing but coughs intermittently  Abdominal: Soft. Normal appearance and bowel sounds are normal. She exhibits no distension. There is tenderness in  the epigastric area. There is no rigidity, no rebound, no guarding, no CVA tenderness, no tenderness at McBurney's point and negative Murphy's sign.    Soft, nondistended, +BS throughout, with moderate epigastric TTP without r/g/r, neg murphy's, neg mcburney's, no CVA TTP  Musculoskeletal: Normal range of motion.  Neurological: She is alert and oriented to person, place, and time.  Skin: Skin is warm, dry and intact. No rash noted.  Psychiatric: She has a normal mood and affect.    ED Course  Procedures (including critical care time) Labs Review Labs Reviewed  CBC WITH DIFFERENTIAL - Abnormal; Notable for the following:    RBC 5.16 (*)    Hemoglobin 15.6 (*)    All other components within normal limits  COMPREHENSIVE METABOLIC PANEL - Abnormal; Notable for the following:     Glucose, Bld 114 (*)    AST 73 (*)    ALT 161 (*)    Anion gap 16 (*)    All other components within normal limits  URINALYSIS, ROUTINE W REFLEX MICROSCOPIC - Abnormal; Notable for the following:    Color, Urine AMBER (*)    APPearance CLOUDY (*)    Specific Gravity, Urine 1.037 (*)    Ketones, ur 15 (*)    Protein, ur 30 (*)    Leukocytes, UA SMALL (*)    All other components within normal limits  URINE MICROSCOPIC-ADD ON - Abnormal; Notable for the following:    Squamous Epithelial / LPF MANY (*)    Bacteria, UA FEW (*)    All other components within normal limits  LIPASE, BLOOD  POC URINE PREG, ED    Imaging Review Dg Abd Acute W/chest  09/11/2014   CLINICAL DATA:  Onset of severe upper abdominal pain with vomiting the last night around 10 p.m. after taking cold medicine ; initial visit  EXAM: ACUTE ABDOMEN SERIES (ABDOMEN 2 VIEW & CHEST 1 VIEW)  COMPARISON:  Acute abdominal series of October 11, 2012  FINDINGS: The lungs are well-expanded. There is no focal infiltrate. The interstitial markings are coarse. The heart and pulmonary vascularity are within the limits of normal. There is no pleural effusion or pneumothorax.  Within the abdomen the gas pattern is unremarkable. There is a moderate stool burden within the colon. An IUD is present. There are no abnormal soft tissue calcifications or gas collections. The bony structures are unremarkable.  IMPRESSION: 1. There is no acute intra-abdominal abnormality. Increased stool burden may reflect clinical constipation. 2. There is no evidence of pneumonia. Coarse interstitial lung markings diffusely may reflect the patient's smoking history  1.   Electronically Signed   By: David  [place]   On: 09/11/2014 09:25     EKG Interpretation None      MDM   Final diagnoses:  Constipation, unspecified constipation type  Non-intractable vomiting with nausea, vomiting of unspecified type  Epigastric pain  Hematemesis with nausea  Wheezing    Bronchitis  Dehydration    33y/o female with epigastric pain and N/V, appears to have hematemesis but small amount and VSS. Has had similar symptoms before without known cause. Could be mallory weiss tear, doubt boerhaave syndrome given lack of CP or subQ air in chest, but CXR will assist with this dx. Pt with wheezing, will get labs and give nebs, and get acute abd series to rule out PNA as well as eval abd for obstruction/perf. Will reassess shortly. Pt given fentanyl without much relief, but would like to avoid narcotics. Given zofran with  no relief, will try phenergan and hope for GI cocktail. Will hold on CT imaging at this time, unless labs return very abnormal. Will re-eval after xray.  9:44 AM Nausea and vomiting improved after phenergan but still feels like she'll vomit. Wants to try morphine for pain at this time. Nursing staff reporting that emesis has pinkish red streaks, no large volume bleeding. CBC w/diff showing hemoconcentration likely related to dehydration. CMP showing AST 73, ALT 161, could be related to dehydration and vomiting or could be related to opiate use, no murphy's sign doubt need for RUQ u/s. Lipase WNL. U/A contaminated catch with many squamous cells, few bacteria, small leuks and 3-6 WBC, and shows dehydration. Doubt UTI from these findings. Acute abd series showing constipation but no obstruction or perf, no PNA but pt does have coarse interstitial lung markings. Will give one more nebs as the first round had minimal improvement of lung sounds. Will try reglan and benadryl for nausea, no emesis in one hr but pt has nausea. Will attempt to try GI cocktail after nausea controlled.   10:56 AM Lung sounds improved but still having expiratory rhonchi. Will give another neb. Will give one more small dose morphine. Nausea improved, will move onto GI cocktail shortly, pt states she'll attempt after nebs.   12:08 PM Pain improved, nausea still lingering but no ongoing  vomiting. Will attempt GI cocktail and water, as well as ativan to help with nausea. Will ambulate with pulse ox, pt stable on RA, still has coarse lung sounds. Likely home with inhalers and mucinex, but if pt fails PO challenge then will admit.   12:41 PM Ambulated without difficulty and maintained oxygen saturations. Will send home with pred pack, inhaler, mucinex and instructed on symptomatic control with steam vapors. Will give phenergan for nausea, zantac for GI pain/likely mallory weiss tear, and ativan for refractory nausea. Discussed tylenol for pain, although try to avoid if possible given liver function studies acutely elevated and don't want to stress liver too much. Will not d/c home with opiates given that pt wants to continue staying clean, although unfortunately we had to resort to this for pain control here. Will give small dose of dilaudid prior to d/c, but pt states pain is improved and she's ready for d/c. Pt will f/up with [facility] and wellness. I explained the diagnosis and have given explicit precautions to return to the ER including for any other new or worsening symptoms. The patient understands and accepts the medical plan as it's been dictated and I have answered their questions. Discharge instructions concerning home care and prescriptions have been given. The patient is STABLE and is discharged to home in good condition.  BP 111/65  Pulse 65  Temp(Src) 98.6 F (37 C) (Oral)  Resp 14  Ht 5\' 7"  (1.702 m)  Wt 144 lb (65.318 kg)  BMI 22.55 kg/m2  SpO2 98%  Meds ordered this encounter  Medications  . ondansetron (ZOFRAN) injection 4 mg    Sig:   . fentaNYL (SUBLIMAZE) injection 50 mcg    Sig:   . albuterol (PROVENTIL) (2.5 MG/3ML) 0.083% nebulizer solution 5 mg    Sig:   . ipratropium (ATROVENT) nebulizer solution 0.5 mg    Sig:   . promethazine (PHENERGAN) injection 25 mg    Sig:   . sodium chloride 0.9 % bolus 1,000 mL    Sig:   . metoCLOPramide (REGLAN)  injection 10 mg    Sig:   . diphenhydrAMINE (BENADRYL)  injection 25 mg    Sig:   . morphine 2 MG/ML injection 2 mg    Sig:   . albuterol (PROVENTIL) (2.5 MG/3ML) 0.083% nebulizer solution 5 mg    Sig:   . ipratropium (ATROVENT) nebulizer solution 0.5 mg    Sig:   . sodium chloride 0.9 % bolus 1,000 mL    Sig:   . morphine 2 MG/ML injection 2 mg    Sig:   . albuterol (PROVENTIL) (2.5 MG/3ML) 0.083% nebulizer solution 5 mg    Sig:   . ipratropium (ATROVENT) nebulizer solution 0.5 mg    Sig:   . gi cocktail (Maalox,Lidocaine,Donnatal)    Sig:   . LORazepam (ATIVAN) injection 1 mg    Sig:   . promethazine (PHENERGAN) 25 MG suppository    Sig: Place 1 suppository (25 mg total) rectally every 6 (six) hours as needed for nausea or vomiting.    Dispense:  12 suppository    Refill:  0    Order Specific Question:  Supervising Provider    Answer:  Eber Hong D [[id number]]  . promethazine (PHENERGAN) 25 MG tablet    Sig: Take 1 tablet (25 mg total) by mouth every 6 (six) hours as needed for nausea or vomiting.    Dispense:  10 tablet    Refill:  0    Order Specific Question:  Supervising Provider    Answer:  Eber Hong D [[id number]]  . ranitidine (ZANTAC) 150 MG tablet    Sig: Take 1 tablet (150 mg total) by mouth 2 (two) times daily.    Dispense:  30 tablet    Refill:  0    Order Specific Question:  Supervising Provider    Answer:  Eber Hong D [[id number]]  . Spacer/Aero-Holding Chambers (AEROCHAMBER PLUS WITH MASK) inhaler    Sig: Use as instructed    Dispense:  1 each    Refill:  2    Order Specific Question:  Supervising Provider    Answer:  Eber Hong D [[id number]]  . albuterol (PROVENTIL HFA;VENTOLIN HFA) 108 (90 BASE) MCG/ACT inhaler    Sig: Inhale 2 puffs into the lungs every 2 (two) hours as needed for wheezing or shortness of breath (cough).    Dispense:  1 Inhaler    Refill:  0    Order Specific Question:  Supervising Provider    Answer:  Eber Hong D [[id number]]  .  predniSONE (DELTASONE) 20 MG tablet    Sig: 3 tabs po day one, then 2 po daily x 4 days. Take with breakfast.    Dispense:  11 tablet    Refill:  0    Order Specific Question:  Supervising Provider    Answer:  Eber Hong D [[id number]]  . guaiFENesin (MUCINEX) 600 MG 12 hr tablet    Sig: Take 1 tablet (600 mg total) by mouth 2 (two) times daily as needed for cough or to loosen phlegm.    Dispense:  10 tablet    Refill:  0    Order Specific Question:  Supervising Provider    Answer:  Eber Hong D [[id number]]  . HYDROmorphone (DILAUDID) injection 0.5 mg    Sig:   . LORazepam (ATIVAN) 1 MG tablet    Sig: Take 1 tablet (1 mg total) by mouth every 8 (eight) hours as needed (nausea and vomiting).    Dispense:  4 tablet    Refill:  0    Order Specific Question:  Supervising Provider  Answer:  Vida Roller [address] Camprubi-Soms, PA-C 09/11/14 1300

## 2014-09-11 NOTE — ED Notes (Signed)
Patient c/o cough and wheezing x2 days. Patient c/o "non stop" emesis x1 day with blood in same. Patient rates pain 9/10. Patient A&O x4.

## 2014-09-11 NOTE — ED Notes (Signed)
Post xray pt request something for pain. PA notified.

## 2014-09-11 NOTE — ED Provider Notes (Signed)
Medical screening examination/treatment/procedure(s) were conducted as a shared visit with non-physician practitioner(s) and myself.  I personally evaluated the patient during the encounter.   EKG Interpretation None      Patient here with emesis, epigastric pain. Also having some wheezing, she is a smoker. No diarrhea, no fevers. Labs show mild transaminitis, no imaging necessary. Patient tolerating PO, able to ambulate, stable for discharge.  Elwin MochaBlair Dollye Glasser, MD 09/11/14 1538

## 2014-09-11 NOTE — ED Notes (Signed)
Respiratory called to assess pt post breathing treatment.

## 2014-09-11 NOTE — ED Notes (Signed)
Pt states has not vomited since pain medication given. See MAR 1938. Pt reports pain better.

## 2014-09-11 NOTE — ED Notes (Addendum)
Pt reports productive cough with clear sputum for 3 days and n/v starting 2200 last night.   Attempt to draw labs via RAC unsuccessful. Mini lab notified and states will attempt lab draw.

## 2014-09-11 NOTE — ED Notes (Signed)
After triage pts visitor notified writer pt was released from detox from opiates 3 days ago. Pt states she last used 1 week ago.

## 2014-09-11 NOTE — ED Notes (Signed)
Pt presents with emesis x 15 in last 10 hours. Pt reports dark red blood in emesis. Cramping abd pain.

## 2014-10-06 ENCOUNTER — Encounter (HOSPITAL_COMMUNITY): Payer: Self-pay | Admitting: Emergency Medicine

## 2015-03-31 ENCOUNTER — Emergency Department (HOSPITAL_COMMUNITY)
Admission: EM | Admit: 2015-03-31 | Discharge: 2015-03-31 | Disposition: A | Discharge status: Home / Self Care | Payer: Self-pay | Attending: Emergency Medicine | Admitting: Emergency Medicine

## 2015-03-31 ENCOUNTER — Encounter (HOSPITAL_COMMUNITY): Payer: Self-pay | Admitting: Emergency Medicine

## 2015-03-31 DIAGNOSIS — L02413 Cutaneous abscess of right upper limb: Secondary | ICD-10-CM | POA: Insufficient documentation

## 2015-03-31 DIAGNOSIS — Z79899 Other long term (current) drug therapy: Secondary | ICD-10-CM | POA: Insufficient documentation

## 2015-03-31 DIAGNOSIS — L03113 Cellulitis of right upper limb: Secondary | ICD-10-CM | POA: Insufficient documentation

## 2015-03-31 DIAGNOSIS — Z8742 Personal history of other diseases of the female genital tract: Secondary | ICD-10-CM | POA: Insufficient documentation

## 2015-03-31 DIAGNOSIS — Z720 Tobacco use: Secondary | ICD-10-CM | POA: Insufficient documentation

## 2015-03-31 MED ORDER — ONDANSETRON 4 MG PO TBDP
4.0000 mg | ORAL_TABLET | Freq: Once | ORAL | Status: AC
  Administered 2015-03-31: 4 mg via ORAL
  Filled 2015-03-31: qty 1

## 2015-03-31 MED ORDER — CEPHALEXIN 500 MG PO CAPS: 500.0000 mg | ORAL_CAPSULE | Freq: Four times a day (QID) | ORAL | Status: DC

## 2015-03-31 MED ORDER — LIDOCAINE-EPINEPHRINE 2 %-1:100000 IJ SOLN
INTRAMUSCULAR | Status: AC
  Filled 2015-03-31: qty 1

## 2015-03-31 MED ORDER — SULFAMETHOXAZOLE-TRIMETHOPRIM 800-160 MG PO TABS: 1.0000 | ORAL_TABLET | Freq: Two times a day (BID) | ORAL | Status: AC

## 2015-03-31 MED ORDER — LIDOCAINE-EPINEPHRINE 2 %-1:100000 IJ SOLN
20.0000 mL | Freq: Once | INTRAMUSCULAR | Status: AC
  Administered 2015-03-31: 20 mL

## 2015-03-31 MED ORDER — HYDROCODONE-ACETAMINOPHEN 5-325 MG PO TABS: 1.0000 | ORAL_TABLET | ORAL | Status: DC | PRN

## 2015-03-31 MED ORDER — LIDOCAINE-EPINEPHRINE (PF) 2 %-1:200000 IJ SOLN: 10.0000 mL | Freq: Once | INTRAMUSCULAR | Status: DC

## 2015-03-31 MED ORDER — HYDROCODONE-ACETAMINOPHEN 5-325 MG PO TABS
2.0000 | ORAL_TABLET | Freq: Once | ORAL | Status: AC
  Administered 2015-03-31: 2 via ORAL
  Filled 2015-03-31: qty 2

## 2015-03-31 NOTE — Discharge Instructions (Signed)
Take Vicodin for severe pain only. No driving or operating heavy machinery while taking vicodin. This medication may cause drowsiness. Take Bactrim and Keflex as directed for the entire week. It is important to take the entire course of antibiotic. Apply warm compresses intermittently throughout the day. Follow-up with the wellness clinic to establish care with a primary care physician and for recheck.  Abscess An abscess is an infected area that contains a collection of pus and debris.It can occur in almost any part of the body. An abscess is also known as a furuncle or boil. CAUSES  An abscess occurs when tissue gets infected. This can occur from blockage of oil or sweat glands, infection of hair follicles, or a minor injury to the skin. As the body tries to fight the infection, pus collects in the area and creates pressure under the skin. This pressure causes pain. People with weakened immune systems have difficulty fighting infections and get certain abscesses more often.  SYMPTOMS Usually an abscess develops on the skin and becomes a painful mass that is red, warm, and tender. If the abscess forms under the skin, you may feel a moveable soft area under the skin. Some abscesses break open (rupture) on their own, but most will continue to get worse without care. The infection can spread deeper into the body and eventually into the bloodstream, causing you to feel ill.  DIAGNOSIS  Your caregiver will take your medical history and perform a physical exam. A sample of fluid may also be taken from the abscess to determine what is causing your infection. TREATMENT  Your caregiver may prescribe antibiotic medicines to fight the infection. However, taking antibiotics alone usually does not cure an abscess. Your caregiver may need to make a small cut (incision) in the abscess to drain the pus. In some cases, gauze is packed into the abscess to reduce pain and to continue draining the area. HOME CARE  INSTRUCTIONS   Only take over-the-counter or prescription medicines for pain, discomfort, or fever as directed by your caregiver.  If you were prescribed antibiotics, take them as directed. Finish them even if you start to feel better.  If gauze is used, follow your caregiver's directions for changing the gauze.  To avoid spreading the infection:  Keep your draining abscess covered with a bandage.  Wash your hands well.  Do not share personal care items, towels, or whirlpools with others.  Avoid skin contact with others.  Keep your skin and clothes clean around the abscess.  Keep all follow-up appointments as directed by your caregiver. SEEK MEDICAL CARE IF:   You have increased pain, swelling, redness, fluid drainage, or bleeding.  You have muscle aches, chills, or a general ill feeling.  You have a fever. MAKE SURE YOU:   Understand these instructions.  Will watch your condition.  Will get help right away if you are not doing well or get worse. Document Released: 08/31/2005 Document Revised: 05/22/2012 Document Reviewed: 02/03/2012 [facility] Patient Information 2015 [place], [place]. This information is not intended to replace advice given to you by your health care provider. Make sure you discuss any questions you have with your health care provider.  Cellulitis Cellulitis is an infection of the skin and the tissue beneath it. The infected area is usually red and tender. Cellulitis occurs most often in the arms and lower legs.  CAUSES  Cellulitis is caused by bacteria that enter the skin through cracks or cuts in the skin. The most common types of  bacteria that cause cellulitis are staphylococci and streptococci. SIGNS AND SYMPTOMS   Redness and warmth.  Swelling.  Tenderness or pain.  Fever. DIAGNOSIS  Your health care provider can usually determine what is wrong based on a physical exam. Blood tests may also be done. TREATMENT  Treatment usually involves  taking an antibiotic medicine. HOME CARE INSTRUCTIONS   Take your antibiotic medicine as directed by your health care provider. Finish the antibiotic even if you start to feel better.  Keep the infected arm or leg elevated to reduce swelling.  Apply a warm cloth to the affected area up to 4 times per day to relieve pain.  Take medicines only as directed by your health care provider.  Keep all follow-up visits as directed by your health care provider. SEEK MEDICAL CARE IF:   You notice red streaks coming from the infected area.  Your red area gets larger or turns dark in color.  Your bone or joint underneath the infected area becomes painful after the skin has healed.  Your infection returns in the same area or another area.  You notice a swollen bump in the infected area.  You develop new symptoms.  You have a fever. SEEK IMMEDIATE MEDICAL CARE IF:   You feel very sleepy.  You develop vomiting or diarrhea.  You have a general ill feeling (malaise) with muscle aches and pains. MAKE SURE YOU:   Understand these instructions.  Will watch your condition.  Will get help right away if you are not doing well or get worse. Document Released: 08/31/2005 Document Revised: 04/07/2014 Document Reviewed: 02/06/2012 [facility] Patient Information 2015 [place], [place]. This information is not intended to replace advice given to you by your health care provider. Make sure you discuss any questions you have with your health care provider.

## 2015-03-31 NOTE — ED Provider Notes (Signed)
CSN: [id number]     Arrival date & time 03/31/15  1411 History  This chart was scribed for non-physician practitioner, Celene Skeen, PA-C, working with Shon Baton, MD by Charline Bills, ED Scribe. This patient was seen in room WTR5/WTR5 and the patient's care was started at 2:33 PM.   Chief Complaint  Patient presents with  . Arm Abcess    The history is provided by the patient. No language interpreter was used.   HPI Comments: Debbie Griffin is a 34 y.o. female who presents to the Emergency Department complaining of a gradually worsening painful abscess to her R arm first noticed 5 days ago. Pain is 8/10, exacerbated with touching. She reports associated night sweats and subjective fever. Pt admits to shaving her arms. She denies drainage from the abscess and h/o IV drug use. Pt has tried Benadryl with improvement of surrounding rash.   Past Medical History  Diagnosis Date  . Miscarriage   . Ovarian cyst    Past Surgical History  Procedure Laterality Date  . Wisdom tooth extraction    . Esophagogastroduodenoscopy     Family History  Problem Relation Age of Onset  . Other Neg Hx    History  Substance Use Topics  . Smoking status: Current Some Day Smoker    Types: Cigarettes  . Smokeless tobacco: Never Used  . Alcohol Use: Yes     Comment: rarely   OB History    Gravida Para Term Preterm AB TAB SAB Ectopic Multiple Living   Review of Systems  Constitutional: Positive for fever (subjective).  Skin: Positive for rash.       + Abscess  All other systems reviewed and are negative.  Allergies  Review of patient's allergies indicates no known allergies.  Home Medications   Prior to Admission medications   Medication Sig Start Date End Date Taking? Authorizing Provider  albuterol (PROVENTIL HFA;VENTOLIN HFA) 108 (90 BASE) MCG/ACT inhaler Inhale 2 puffs into the lungs every 2 (two) hours as needed for wheezing or shortness of breath (cough). 09/11/14    Mercedes Camprubi-Soms, PA-C  cephALEXin (KEFLEX) 500 MG capsule Take 1 capsule (500 mg total) by mouth 4 (four) times daily. 03/31/15   Starr Urias M Rica Heather, PA-C  EPINEPHrine (EPIPEN) 0.3 mg/0.3 mL SOAJ injection Inject 0.3 mLs (0.3 mg total) into the muscle as needed. 09/20/13   Tiffany Neva Seat, PA-C  guaiFENesin (MUCINEX) 600 MG 12 hr tablet Take 1 tablet (600 mg total) by mouth 2 (two) times daily as needed for cough or to loosen phlegm. 09/11/14   Mercedes Camprubi-Soms, PA-C  guaiFENesin-dextromethorphan (ROBITUSSIN DM) 100-10 MG/5ML syrup Take 10 mLs by mouth every 4 (four) hours as needed for cough.    Historical Provider, MD  HYDROcodone-acetaminophen (NORCO/VICODIN) 5-325 MG per tablet Take 1-2 tablets by mouth every 4 (four) hours as needed. 03/31/15   Akeema Broder M Avana Kreiser, PA-C  ibuprofen (ADVIL,MOTRIN) 200 MG tablet Take 400 mg by mouth every 6 (six) hours as needed for moderate pain.    Historical Provider, MD  levonorgestrel (MIRENA) 20 MCG/24HR IUD 1 each by Intrauterine route continuous.    Historical Provider, MD  LORazepam (ATIVAN) 1 MG tablet Take 1 tablet (1 mg total) by mouth every 8 (eight) hours as needed (nausea and vomiting). 09/11/14   Mercedes Camprubi-Soms, PA-C  predniSONE (DELTASONE) 20 MG tablet 3 tabs po day one, then 2 po daily x 4 days. Take  with breakfast. 09/11/14   Mercedes Camprubi-Soms, PA-C  promethazine (PHENERGAN) 25 MG suppository Place 1 suppository (25 mg total) rectally every 6 (six) hours as needed for nausea or vomiting. 09/11/14   Mercedes Camprubi-Soms, PA-C  promethazine (PHENERGAN) 25 MG tablet Take 1 tablet (25 mg total) by mouth every 6 (six) hours as needed for nausea or vomiting. 09/11/14   Mercedes Camprubi-Soms, PA-C  ranitidine (ZANTAC) 150 MG tablet Take 1 tablet (150 mg total) by mouth 2 (two) times daily. 09/11/14   Mercedes Camprubi-Soms, PA-C  Spacer/Aero-Holding Chambers (AEROCHAMBER PLUS WITH MASK) inhaler Use as instructed 09/11/14   Mercedes Camprubi-Soms,  PA-C  sulfamethoxazole-trimethoprim (BACTRIM DS,SEPTRA DS) 800-160 MG per tablet Take 1 tablet by mouth 2 (two) times daily. 03/31/15 04/07/15  Landra Howze M Ossie Yebra, PA-C   BP 111/60 mmHg  Pulse 88  Temp(Src) 98.3 F (36.8 C) (Oral)  SpO2 98% Physical Exam  Constitutional: She is oriented to person, place, and time. She appears well-developed and well-nourished. No distress.  HENT:  Head: Normocephalic and atraumatic.  Mouth/Throat: Oropharynx is clear and moist.  Eyes: Conjunctivae and EOM are normal.  Neck: Normal range of motion. Neck supple.  Cardiovascular: Normal rate, regular rhythm and normal heart sounds.   Pulmonary/Chest: Effort normal and breath sounds normal. No respiratory distress.  Musculoskeletal: Normal range of motion. She exhibits no edema.       Arms: Neurological: She is alert and oriented to person, place, and time. No sensory deficit.  Skin: Skin is warm and dry.  Psychiatric: She has a normal mood and affect. Her behavior is normal.  Nursing note and vitals reviewed.  ED Course  Procedures (including critical care time) INCISION AND DRAINAGE Performed by: Celene Skeenobyn Tasean Mancha Consent: Verbal consent obtained. Risks and benefits: risks, benefits and alternatives were discussed Type: abscess  Body area: right forearm  Anesthesia: local infiltration  Incision was made with a scalpel.  Local anesthetic: lidocaine 2% with epinephrine  Anesthetic total: 1 ml  Complexity: complex Blunt dissection to break up loculations  Drainage: purulent  Drainage amount: moderate  Packing material: none  Patient tolerance: Patient tolerated the procedure well with no immediate complications.  DIAGNOSTIC STUDIES: Oxygen Saturation is 98% on RA, normal by my interpretation.    COORDINATION OF CARE: 2:34 PM-Discussed treatment plan which includes I&D, Bactrim, Keflex and Norco with pt at bedside and pt agreed to plan.    Labs Review Labs Reviewed - No data to  display  Imaging Review No results found.   EKG Interpretation None      MDM   Final diagnoses:  Abscess of right forearm  Right forearm cellulitis   Abscess with surrounding cellulitis. Afebrile, vital signs stable. Abscess drained. Treat with Bactrim and Keflex. Resources given for follow-up. Stable for discharge. Return precautions given. Patient states understanding of treatment care plan and is agreeable.  I personally performed the services described in this documentation, which was scribed in my presence. The recorded information has been reviewed and is accurate.  Kathrynn SpeedRobyn M Zayli Villafuerte, PA-C 03/31/15 1523  Shon Batonourtney F Horton, MD 03/31/15 2038

## 2015-03-31 NOTE — ED Notes (Signed)
Discharge delayed to re-evaluate pts pain. Pt is crying loudly in room. No further vomiting

## 2015-03-31 NOTE — ED Notes (Signed)
Pt vomiting . C/o nausea due to pain.

## 2015-03-31 NOTE — ED Notes (Signed)
PA at bedside. Discharge and follow up reviewed

## 2015-03-31 NOTE — ED Notes (Signed)
Pt reports abscess on right arm that she noticed 5 days ago that has become worse and more painful. Pain 8/10

## 2015-05-19 ENCOUNTER — Emergency Department (HOSPITAL_COMMUNITY)
Admission: EM | Admit: 2015-05-19 | Discharge: 2015-05-19 | Disposition: A | Discharge status: Home / Self Care | Payer: Self-pay | Attending: Emergency Medicine | Admitting: Emergency Medicine

## 2015-05-19 ENCOUNTER — Encounter (HOSPITAL_COMMUNITY): Payer: Self-pay | Admitting: Emergency Medicine

## 2015-05-19 DIAGNOSIS — Z79899 Other long term (current) drug therapy: Secondary | ICD-10-CM | POA: Insufficient documentation

## 2015-05-19 DIAGNOSIS — Z792 Long term (current) use of antibiotics: Secondary | ICD-10-CM | POA: Insufficient documentation

## 2015-05-19 DIAGNOSIS — R0602 Shortness of breath: Secondary | ICD-10-CM | POA: Insufficient documentation

## 2015-05-19 DIAGNOSIS — Z7952 Long term (current) use of systemic steroids: Secondary | ICD-10-CM | POA: Insufficient documentation

## 2015-05-19 DIAGNOSIS — Z8742 Personal history of other diseases of the female genital tract: Secondary | ICD-10-CM | POA: Insufficient documentation

## 2015-05-19 DIAGNOSIS — R002 Palpitations: Secondary | ICD-10-CM | POA: Insufficient documentation

## 2015-05-19 DIAGNOSIS — Z720 Tobacco use: Secondary | ICD-10-CM | POA: Insufficient documentation

## 2015-05-19 LAB — COMPREHENSIVE METABOLIC PANEL
ALT: 26 U/L (ref 14–54)
AST: 25 U/L (ref 15–41)
Albumin: 4.3 g/dL (ref 3.5–5.0)
Alkaline Phosphatase: 60 U/L (ref 38–126)
Anion gap: 10 (ref 5–15)
BILIRUBIN TOTAL: 1.8 mg/dL — AB (ref 0.3–1.2)
BUN: 16 mg/dL (ref 6–20)
CHLORIDE: 106 mmol/L (ref 101–111)
CO2: 25 mmol/L (ref 22–32)
CREATININE: 0.96 mg/dL (ref 0.44–1.00)
Calcium: 9.2 mg/dL (ref 8.9–10.3)
GLUCOSE: 96 mg/dL (ref 65–99)
Potassium: 4.3 mmol/L (ref 3.5–5.1)
Sodium: 141 mmol/L (ref 135–145)
Total Protein: 7.2 g/dL (ref 6.5–8.1)

## 2015-05-19 LAB — CBC
HCT: 43.6 % (ref 36.0–46.0)
Hemoglobin: 15.2 g/dL — ABNORMAL HIGH (ref 12.0–15.0)
MCH: 30 pg (ref 26.0–34.0)
MCHC: 34.9 g/dL (ref 30.0–36.0)
MCV: 86.2 fL (ref 78.0–100.0)
Platelets: 212 10*3/uL (ref 150–400)
RBC: 5.06 MIL/uL (ref 3.87–5.11)
RDW: 12.3 % (ref 11.5–15.5)
WBC: 8.4 10*3/uL (ref 4.0–10.5)

## 2015-05-19 LAB — TSH: TSH: 1.03 u[IU]/mL (ref 0.350–4.500)

## 2015-05-19 NOTE — Progress Notes (Addendum)
CM spoke with pt who confirms self pay [facility] resident with no pcp.  CM discussed and provided written information for self pay pcps, discussed the importance of pcp vs EDP services for f/u care, www.needymeds.org, www.goodrx.com, discounted pharmacies and other [employer] such as [employer] , [employer], affordable care act,  [place] med assist, financial assistance, self pay dental services, [place] med assist, DSS and  health department  Reviewed resources for [employer] self pay pcps like [facility], family medicine at [facility] [employer] [facility], [facility], [facility], local urgent care centers, [facility], [facility] family practice, general medical clinics, family services of the [place], [facility] urgent care plus others, medication resources, [facility] out patient pharmacies and housing Pt voiced understanding and appreciation of resources provided   Provided [facility] contact information Pt agreed to a referral Cm completed referral Pt seen by by [facility] clinical liasion while in [facility] Cm had been consulted by EDP, Campos to assist with getting pt information about self pay/uninsured [employer] CV Pt confirms she was seen and states she will follow up  Pt has an adult female and a female child visiting at bedside

## 2015-05-19 NOTE — Discharge Instructions (Signed)

## 2015-05-19 NOTE — ED Provider Notes (Signed)
CSN: [id number]     Arrival date & time 05/19/15  0957 History   First MD Initiated Contact with Patient 05/19/15 1005     Chief Complaint  Patient presents with  . Shortness of Breath  . Palpitations      HPI Patient presents with intermittent palpitations.  She states that now that she has arrived in the emergency department her palpitations have resolved.  She states palpitations were present for most of last night.  He made her feel short of breath.  No recent medication changes.  She denies over-the-counter supplements.  She denies heavy caffeine intake.  No diastolic murmurs.  No history of thyroid issues.  No syncope.  No chest pain or shortness of breath at this time.   Past Medical History  Diagnosis Date  . Miscarriage   . Ovarian cyst    Past Surgical History  Procedure Laterality Date  . Wisdom tooth extraction    . Esophagogastroduodenoscopy     Family History  Problem Relation Age of Onset  . Other Neg Hx    History  Substance Use Topics  . Smoking status: Current Some Day Smoker    Types: Cigarettes  . Smokeless tobacco: Never Used  . Alcohol Use: Yes     Comment: rarely   OB History    Gravida Para Term Preterm AB TAB SAB Ectopic Multiple Living   2 1 1  1  1   1      Review of Systems  All other systems reviewed and are negative.     Allergies  Review of patient's allergies indicates no known allergies.  Home Medications   Prior to Admission medications   Medication Sig Start Date End Date Taking? Authorizing Provider  ibuprofen (ADVIL,MOTRIN) 200 MG tablet Take 400 mg by mouth every 6 (six) hours as needed for moderate pain.   Yes Historical Provider, MD  levonorgestrel (MIRENA) 20 MCG/24HR IUD 1 each by Intrauterine route continuous.   Yes Historical Provider, MD  albuterol (PROVENTIL HFA;VENTOLIN HFA) 108 (90 BASE) MCG/ACT inhaler Inhale 2 puffs into the lungs every 2 (two) hours as needed for wheezing or shortness of breath (cough). Patient  not taking: Reported on 05/19/2015 09/11/14   Mercedes Camprubi-Soms, PA-C  cephALEXin (KEFLEX) 500 MG capsule Take 1 capsule (500 mg total) by mouth 4 (four) times daily. Patient not taking: Reported on 05/19/2015 03/31/15   Kathrynn Speed, PA-C  EPINEPHrine (EPIPEN) 0.3 mg/0.3 mL SOAJ injection Inject 0.3 mLs (0.3 mg total) into the muscle as needed. Patient not taking: Reported on 05/19/2015 09/20/13   Marlon Pel, PA-C  guaiFENesin (MUCINEX) 600 MG 12 hr tablet Take 1 tablet (600 mg total) by mouth 2 (two) times daily as needed for cough or to loosen phlegm. Patient not taking: Reported on 05/19/2015 09/11/14   Mercedes Camprubi-Soms, PA-C  HYDROcodone-acetaminophen (NORCO/VICODIN) 5-325 MG per tablet Take 1-2 tablets by mouth every 4 (four) hours as needed. Patient not taking: Reported on 05/19/2015 03/31/15   Nada Boozer [employer], PA-C  LORazepam (ATIVAN) 1 MG tablet Take 1 tablet (1 mg total) by mouth every 8 (eight) hours as needed (nausea and vomiting). Patient not taking: Reported on 05/19/2015 09/11/14   Mercedes Camprubi-Soms, PA-C  predniSONE (DELTASONE) 20 MG tablet 3 tabs po day one, then 2 po daily x 4 days. Take with breakfast. Patient not taking: Reported on 05/19/2015 09/11/14   Mercedes Camprubi-Soms, PA-C  promethazine (PHENERGAN) 25 MG suppository Place 1 suppository (25 mg total) rectally every 6 (  six) hours as needed for nausea or vomiting. Patient not taking: Reported on 05/19/2015 09/11/14   Mercedes Camprubi-Soms, PA-C  promethazine (PHENERGAN) 25 MG tablet Take 1 tablet (25 mg total) by mouth every 6 (six) hours as needed for nausea or vomiting. Patient not taking: Reported on 05/19/2015 09/11/14   Mercedes Camprubi-Soms, PA-C  ranitidine (ZANTAC) 150 MG tablet Take 1 tablet (150 mg total) by mouth 2 (two) times daily. Patient not taking: Reported on 05/19/2015 09/11/14   Mercedes Camprubi-Soms, PA-C  Spacer/Aero-Holding Chambers (AEROCHAMBER PLUS WITH MASK) inhaler Use as instructed Patient  not taking: Reported on 05/19/2015 09/11/14   Mercedes Camprubi-Soms, PA-C   BP 132/91 mmHg  Pulse 107  Temp(Src) 98.2 F (36.8 C) (Oral)  Resp 22  SpO2 99% Physical Exam  Constitutional: She is oriented to person, place, and time. She appears well-developed and well-nourished. No distress.  HENT:  Head: Normocephalic and atraumatic.  Eyes: EOM are normal.  Neck: Normal range of motion.  Cardiovascular: Normal rate, regular rhythm and normal heart sounds.   Pulmonary/Chest: Effort normal and breath sounds normal.  Abdominal: Soft. She exhibits no distension. There is no tenderness.  Musculoskeletal: Normal range of motion.  Neurological: She is alert and oriented to person, place, and time.  Skin: Skin is warm and dry.  Psychiatric: She has a normal mood and affect. Judgment normal.  Nursing note and vitals reviewed.   ED Course  Procedures (including critical care time) Labs Review Labs Reviewed  CBC - Abnormal; Notable for the following:    Hemoglobin 15.2 (*)    All other components within normal limits  COMPREHENSIVE METABOLIC PANEL  TSH    Imaging Review No results found.  ECG interpretation   Date: 05/19/2015  Rate: 77  Rhythm: normal sinus rhythm  QRS Axis: normal  Intervals: normal  ST/T Wave abnormalities: normal  Conduction Disutrbances: none  Narrative Interpretation:   Old EKG Reviewed: no prior ecg available     MDM   Final diagnoses:  None    Patient placed on cardiac monitor.  Sinus rhythm while in the emergency department.  EKG with normal sinus rhythm.  Labs and thyroid studies are normal.  Outpatient Holter monitor ordered.  She will need to contact cardiology for outpatient follow-up and Holter monitoring.    Azalia Bilis, MD 05/19/15 [phone]

## 2015-05-19 NOTE — ED Notes (Signed)
Pt states she feels as if her heart is beating fast which is causing her to become SOB. Denies chest pain.

## 2016-04-12 ENCOUNTER — Ambulatory Visit (INDEPENDENT_AMBULATORY_CARE_PROVIDER_SITE_OTHER): Payer: Medicaid Other | Admitting: Podiatry

## 2016-04-12 ENCOUNTER — Encounter: Payer: Self-pay | Admitting: Podiatry

## 2016-04-12 ENCOUNTER — Telehealth: Payer: Self-pay | Admitting: *Deleted

## 2016-04-12 ENCOUNTER — Ambulatory Visit (INDEPENDENT_AMBULATORY_CARE_PROVIDER_SITE_OTHER): Payer: Medicaid Other

## 2016-04-12 VITALS — BP 108/76 | HR 80 | Resp 12

## 2016-04-12 DIAGNOSIS — M722 Plantar fascial fibromatosis: Secondary | ICD-10-CM | POA: Diagnosis not present

## 2016-04-12 DIAGNOSIS — M8430XA Stress fracture, unspecified site, initial encounter for fracture: Secondary | ICD-10-CM | POA: Diagnosis not present

## 2016-04-12 DIAGNOSIS — M79672 Pain in left foot: Secondary | ICD-10-CM

## 2016-04-12 NOTE — Progress Notes (Signed)
   Subjective:    Patient ID: Debbie Griffin, female    DOB: 09/02/1981, 35 y.o.   MRN: [id number]  HPI     This patient presents today with a one-month history of a painful dorsal left foot aggravated with standing and weightbearing relieved with rest and elevation. It is quite active playing soccer on the lead, however, discontinue soccer the past several weeks because of left foot pain. The symptoms have persisted and worsened in spite of discontinuing her soccer . She has tried ice and bracing without any reduction of the symptoms. 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 She also complains of approximate 1 year history of inferior left heel pain which she describes as plantar fasciitis. Because of the pain or on the dorsal aspect of the left foot as well as the inferior left heel pain she describes a painful gait shifting overweight to accommodate to the left foot pain.  Review of Systems  Musculoskeletal: Positive for gait problem.       Objective:   Physical Exam  Orientated 3  Vascular: No peripheral edema noted bilaterally DP and PT pulses 2/4 bilaterally Capillary reflex immediate bilaterally  Neurological: Sensation to 10 g monofilament wire intact 5/5 bilaterally Vibratory sensation reactive bilaterally Ankle reflex equal and reactive bilaterally  Dermatological: Texture and turgor within normal limits No skin lesions noted bilaterally  Musculoskeletal: Palpable tenderness dorsal left midfoot medially without any palpable lesions Palpable tenderness medial plantar fascial insertional area left without any palpable lesions Manual motor testing: Dorsi flexion, plantar flexion, inversion, eversion 5/5 bilaterally  Xray examination weightbearing left foot dated 04/12/2016  Intact bony structures without fracture and/or dislocation Metatarsus adductus Bunionette  Radiographic impression: No acute  bony abnormality noted in the left foot on x-ray of 04/12/2016     Assessment & Plan:   Assessment: Bone stress reaction left foot rule out stress fracture Plantar fasciitis left  Plan: Today reviewed the results of x-ray and examination with patient today. I made aware that at the very least she had a bone stress reaction cannot rule out stress fracture Patient referred for a noncontrast MRI left foot drop stress fracture Cam Walker boot dispensed to wear on the left foot leg  Reappoint upon receipt of MRI results

## 2016-04-12 NOTE — Patient Instructions (Signed)
Today your examination and x-ray is suggestive of a bone stress reaction or possible stress fracture in the left foot Also, the left heel is painful associate with plantar fasciitis  wear the boot on the left foot when you stand and walk Ordering a noncontrast MRI and will notify you of the results

## 2016-04-12 NOTE — Telephone Encounter (Addendum)
Orders given to [facility] to pre-cert.  04/14/2016-[employer] APPROVED MRI OF LEFT FOOT 73718, AUTHORIZATION #[id number], VALID 04/13/2016 EXPIRES 05/13/2016.  [facility]EDTO [facility].  04/19/2016-Pt states she just had her MRI and her foot really hurts would like a pain medication called in.  Dr. Stacie AcresMayer is the on-call doctor and he ordered Tramadol 50mg  #30 one tablet every 8 hours prn foot pain.  Orders to [facility] and pt.  04/21/2016-Pt called for MRI results. I told pt we had the results and that it would benefit her to make an appt to discuss with Dr. Leeanne Deeduchman.  Transferred to schedulers.

## 2016-04-19 ENCOUNTER — Other Ambulatory Visit: Payer: Self-pay

## 2016-04-19 ENCOUNTER — Ambulatory Visit
Admission: RE | Admit: 2016-04-19 | Discharge: 2016-04-19 | Disposition: A | Discharge status: Home / Self Care | Payer: Medicaid Other | Source: Ambulatory Visit | Attending: Podiatry | Admitting: Podiatry

## 2016-04-19 DIAGNOSIS — M79672 Pain in left foot: Secondary | ICD-10-CM

## 2016-04-19 DIAGNOSIS — M8430XA Stress fracture, unspecified site, initial encounter for fracture: Secondary | ICD-10-CM

## 2016-04-19 MED ORDER — TRAMADOL HCL 50 MG PO TABS: 50.0000 mg | ORAL_TABLET | Freq: Three times a day (TID) | ORAL | Status: DC | PRN

## 2016-04-26 NOTE — Telephone Encounter (Addendum)
-----   Message from Carrington Clampichard C Tuchman, DPM sent at 04/26/2016  7:54 AM EDT ----- Contact patient and advised her she has a stress reaction in the fifth metatarsal and to continue wearing the boot for a total of 6 weeks Then reappoint patient after she is warm the boot 6 weeks for follow-up  IMPRESSION: Mild marrow edema in the mid fifth metatarsal diaphysis likely reflecting mild stress reaction without a stress fracture.  04/26/2016-I informed pt of the MRI results.  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  Note written to be out of work until reevaluated in 6 weeks by Dr. Leeanne Deeduchman.  I transferred pt to schedulers to cancel tomorrow's appt and to reschedule for 6 weeks from her 1st appt.  I mailed note to pt.

## 2016-04-27 ENCOUNTER — Ambulatory Visit: Payer: Medicaid Other | Admitting: Podiatry

## 2016-05-18 ENCOUNTER — Encounter: Payer: Self-pay | Admitting: Podiatry

## 2016-05-18 ENCOUNTER — Ambulatory Visit (INDEPENDENT_AMBULATORY_CARE_PROVIDER_SITE_OTHER): Payer: Medicaid Other | Admitting: Podiatry

## 2016-05-18 VITALS — BP 113/78 | HR 100 | Resp 12

## 2016-05-18 DIAGNOSIS — M8430XP Stress fracture, unspecified site, subsequent encounter for fracture with malunion: Secondary | ICD-10-CM | POA: Diagnosis not present

## 2016-05-18 DIAGNOSIS — M8430XD Stress fracture, unspecified site, subsequent encounter for fracture with routine healing: Secondary | ICD-10-CM

## 2016-05-18 NOTE — Patient Instructions (Signed)
At this time the foot is healing from the stress reaction Discontinue wearing the boot Recommend returning to work first 1-2 half-time and then full-time After your foot is pain free then resume full athletic activity as well as work activities

## 2016-05-18 NOTE — Progress Notes (Signed)
   Subjective:    Patient ID: Debbie Griffin, female    DOB: 12-May-1981, 35 y.o.   MRN: [id number]  HPI  This patient presents for a follow-up examination and progress for a stress reaction in the left foot. She initially to our office on 04/12/2016 with the following complaint below This patient presents today with a one-month history of a painful dorsal left foot aggravated with standing and weightbearing relieved with rest and elevation. It is quite active playing soccer on the lead, however, discontinue soccer the past several weeks because of left foot pain. The symptoms have persisted and worsened in spite of discontinuing her soccer . She has tried ice and bracing without any reduction of the symptoms. 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 She also complains of approximate 1 year history of inferior left heel pain which she describes as plantar fasciitis. Because of the pain or on the dorsal aspect of the left foot as well as the inferior left heel pain she describes a painful gait shifting overweight to accommodate to the left foot pain. A Cam Walker boot was dispensed on the visit 04/12/2016 and MRI image was ordered. The MRI image of the left foot dated 04/19/2016 demonstrated mild stress reaction of the mid fifth metatarsal diaphysis likely would reflect a mild stress reaction without a stress fracture. Patient was contacted via phone on [phone] with instructions to continue wearing the Cam Walker boot on the left foot for 6 weeks and presented office for follow-up. Patient has file these instructions and is warm the Cam Walker boot on the left foot states that the left foot is feeling better. She also only worked in a limited fashion as a part-time as a Child psychotherapistwaitress during this recovery.    Review of Systems  Skin: Positive for color change.       Objective:   Physical Exam   Orientated 3  Vascular: No peripheral edema  noted bilaterally DP and PT pulses 2/4 bilaterally Capillary reflex immediate bilaterally  Neurological: Sensation to 10 g monofilament wire intact 5/5 bilaterally Vibratory sensation reactive bilaterally Ankle reflex equal and reactive bilaterally  Dermatological: Texture and turgor within normal limits No skin lesions noted bilaterally  Musculoskeletal:  No Palpable tenderness dorsal left midfoot medially without any palpable lesions No Palpable tenderness medial plantar fascial insertional area left without any palpable lesions No palpable tenderness of fifth metatarsal shaft Manual motor testing: Dorsi flexion, plantar flexion, inversion, eversion 5/5 bilaterally          Assessment & Plan:   Assessment: Resolving stress reaction left foot  Plan: DC Cam Walker boot Recommend returning to waitressing jobs part-time for the first 1-2 weeks Gradually increase activity to tolerance After pain subsides completely returned to soccer   Patient is discharged

## 2016-10-30 ENCOUNTER — Emergency Department (HOSPITAL_COMMUNITY)
Admission: EM | Admit: 2016-10-30 | Discharge: 2016-10-31 | Disposition: A | Discharge status: Home / Self Care | Payer: Medicaid Other | Attending: Emergency Medicine | Admitting: Emergency Medicine

## 2016-10-30 ENCOUNTER — Encounter (HOSPITAL_COMMUNITY): Payer: Self-pay | Admitting: *Deleted

## 2016-10-30 DIAGNOSIS — F4325 Adjustment disorder with mixed disturbance of emotions and conduct: Secondary | ICD-10-CM | POA: Diagnosis present

## 2016-10-30 DIAGNOSIS — F1110 Opioid abuse, uncomplicated: Secondary | ICD-10-CM | POA: Diagnosis present

## 2016-10-30 DIAGNOSIS — Z79899 Other long term (current) drug therapy: Secondary | ICD-10-CM | POA: Insufficient documentation

## 2016-10-30 DIAGNOSIS — F17210 Nicotine dependence, cigarettes, uncomplicated: Secondary | ICD-10-CM | POA: Insufficient documentation

## 2016-10-30 LAB — RAPID URINE DRUG SCREEN, HOSP PERFORMED
AMPHETAMINES: NOT DETECTED
BARBITURATES: NOT DETECTED
Benzodiazepines: POSITIVE — AB
COCAINE: NOT DETECTED
Opiates: POSITIVE — AB
TETRAHYDROCANNABINOL: POSITIVE — AB

## 2016-10-30 LAB — COMPREHENSIVE METABOLIC PANEL
ALBUMIN: 4.2 g/dL (ref 3.5–5.0)
ALK PHOS: 54 U/L (ref 38–126)
ALT: 17 U/L (ref 14–54)
AST: 20 U/L (ref 15–41)
Anion gap: 9 (ref 5–15)
BUN: 17 mg/dL (ref 6–20)
CALCIUM: 8.9 mg/dL (ref 8.9–10.3)
CO2: 26 mmol/L (ref 22–32)
CREATININE: 0.94 mg/dL (ref 0.44–1.00)
Chloride: 102 mmol/L (ref 101–111)
GFR calc Af Amer: >60 mL/min (ref >60)
GFR calc non Af Amer: >60 mL/min (ref >60)
GLUCOSE: 105 mg/dL — AB (ref 65–99)
Potassium: 3.5 mmol/L (ref 3.5–5.1)
SODIUM: 137 mmol/L (ref 135–145)
Total Bilirubin: 1 mg/dL (ref 0.3–1.2)
Total Protein: 7 g/dL (ref 6.5–8.1)

## 2016-10-30 LAB — CBC
HEMATOCRIT: 39 % (ref 36.0–46.0)
HEMOGLOBIN: 13.3 g/dL (ref 12.0–15.0)
MCH: 30.1 pg (ref 26.0–34.0)
MCHC: 34.1 g/dL (ref 30.0–36.0)
MCV: 88.2 fL (ref 78.0–100.0)
Platelets: 244 10*3/uL (ref 150–400)
RBC: 4.42 MIL/uL (ref 3.87–5.11)
RDW: 12.4 % (ref 11.5–15.5)
WBC: 9.3 10*3/uL (ref 4.0–10.5)

## 2016-10-30 LAB — ACETAMINOPHEN LEVEL: Acetaminophen (Tylenol), Serum: <10 ug/mL — ABNORMAL LOW (ref 10–30)

## 2016-10-30 LAB — I-STAT BETA HCG BLOOD, ED (MC, WL, AP ONLY): I-stat hCG, quantitative: <5 m[IU]/mL (ref <5)

## 2016-10-30 LAB — ETHANOL: Alcohol, Ethyl (B): <5 mg/dL (ref <5)

## 2016-10-30 LAB — SALICYLATE LEVEL: Salicylate Lvl: <7 mg/dL (ref 2.8–30.0)

## 2016-10-30 MED ORDER — ONDANSETRON 4 MG PO TBDP
4.0000 mg | ORAL_TABLET | Freq: Once | ORAL | Status: AC
  Administered 2016-10-30: 4 mg via ORAL
  Filled 2016-10-30: qty 1

## 2016-10-30 NOTE — ED Notes (Signed)
Pt states she does not want her father present while doctor is in the room

## 2016-10-30 NOTE — ED Provider Notes (Signed)
[facility]-EMERGENCY DEPT Provider Note   CSN: [id number] Arrival date & time: 10/30/16  2132     History   Chief Complaint Chief Complaint  Patient presents with  . Suicidal  . Drug Overdose    HPI Debbie Griffin is a 35 y.o. female.  The history is provided by the patient. No language interpreter was used.    Debbie Griffin is a 35 y.o. female who presents to the Emergency Department complaining of over dose. 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 Over the last several weeks she's felt increased stress and concern that she may relapse she's been having poor sleep and intermittent hallucinations. She recently treated herself with it again 2 weeks ago but did not sleep for 4 days after the treatment. She has been feeling increased stress and cravings for heroin. This evening she used heroin again and began hitting her jacket at the mirror and punch in the mirror and her father came in to see what was going on. She is having episodes of blacking out and not recalling events. Yesterday she was sent home from work and she is not sure why. She was sick with URI symptoms a week ago but these are now gone.  Past Medical History:  Diagnosis Date  . Miscarriage   . Ovarian cyst     There are no active problems to display for this patient.   Past Surgical History:  Procedure Laterality Date  . ESOPHAGOGASTRODUODENOSCOPY    . WISDOM TOOTH EXTRACTION      OB History    Gravida Para Term Preterm AB Living   2 1 1   1 1    SAB TAB Ectopic Multiple Live Births   1               Home Medications    Prior to Admission medications   Medication Sig Start Date End Date Taking? Authorizing Provider  traMADol (ULTRAM) 50 MG tablet Take 1 tablet (50 mg total) by mouth every 8 (eight) hours as needed. 04/19/16   Helane GuntherGregory Mayer, DPM    Family History Family History  Problem Relation Age of Onset  . Other Neg Hx     Social  History Social History  Substance Use Topics  . Smoking status: Current Some Day Smoker    Types: Cigarettes  . Smokeless tobacco: Never Used  . Alcohol use Yes     Comment: rarely     Allergies   Patient has no known allergies.   Review of Systems Review of Systems  All other systems reviewed and are negative.    Physical Exam Updated Vital Signs BP 119/82 (BP Location: Left Arm)   Pulse 95   Temp 99 F (37.2 C) (Oral)   Resp 22   SpO2 95%   Physical Exam  Constitutional: She is oriented to person, place, and time. She appears well-developed and well-nourished.  HENT:  Head: Normocephalic and atraumatic.  Cardiovascular: Normal rate and regular rhythm.   No murmur heard. Pulmonary/Chest: Effort normal and breath sounds normal. No respiratory distress.  Abdominal: Soft. There is no tenderness. There is no rebound and no guarding.  Musculoskeletal: She exhibits no edema or tenderness.  Neurological: She is alert and oriented to person, place, and time.  Skin: Skin is warm and dry.  Psychiatric:  Anxious and tearful  Nursing note and vitals reviewed.    ED Treatments / Results  Labs (all  labs ordered are listed, but only abnormal results are displayed) Labs Reviewed  COMPREHENSIVE METABOLIC PANEL - Abnormal; Notable for the following:       Result Value   Glucose, Bld 105 (*)    All other components within normal limits  ACETAMINOPHEN LEVEL - Abnormal; Notable for the following:    Acetaminophen (Tylenol), Serum <10 (*)    All other components within normal limits  RAPID URINE DRUG SCREEN, HOSP PERFORMED - Abnormal; Notable for the following:    Opiates POSITIVE (*)    Benzodiazepines POSITIVE (*)    Tetrahydrocannabinol POSITIVE (*)    All other components within normal limits  ETHANOL  SALICYLATE LEVEL  CBC  I-STAT BETA HCG BLOOD, ED ([facility], [facility], [facility] ONLY)    EKG  EKG Interpretation  Date/Time:  Sunday October 30 2016 22:28:19 EST Ventricular Rate:   76 PR Interval:    QRS Duration: 107 QT Interval:  379 QTC Calculation: 427 R Axis:   27 Text Interpretation:  Sinus rhythm Confirmed by Lincoln Brighamees, Liz [phone]([id number]) on 10/30/2016 10:40:09 PM       Radiology No results found.  Procedures Procedures (including critical care time)  Medications Ordered in ED Medications - No data to display   Initial Impression / Assessment and Plan / ED Course  I have reviewed the triage vital signs and the nursing notes.  Pertinent labs & imaging results that were available during my care of the patient were reviewed by me and considered in my medical decision making (see chart for details).  Clinical Course     Patient with history of drug use here with passive SI, hallucinations, relapse on heroin today.  She has been medically cleared for psychiatric evaluation and treatment.  Final Clinical Impressions(s) / ED Diagnoses   Final diagnoses:  None    New Prescriptions New Prescriptions   No medications on file     Tilden FossaElizabeth Mahoganie Basher, MD 10/31/16 0022

## 2016-10-30 NOTE — ED Notes (Signed)
Bed: [id number] Expected date:  Expected time:  Means of arrival:  [facility]

## 2016-10-30 NOTE — ED Notes (Signed)
No respiratory or acute distress noted alert and oriented x 3 family at bedside.  Ilene RN charge nurse aware pt need hospital sitter.

## 2016-10-30 NOTE — ED Triage Notes (Signed)
Pt states that she has been clean from Heroin for approx a year; pt states that she used some African Root to clean from Heroin a year ago so pt decided to try to use the root to quit smoking; pt states that she has not slept in 4 days and states "I have lost sense of time"; pt states that she has dreams that she is dead and her son is over her crying for her; pt states "I feel like I am going to die"pt states that "I am suicidal and have thoughts of dying"; pt denies SI plan; pt states that she used Heroin this evening and has been agitated, vomiting and crying since using; pt states that she used around 9pm; family states that they found the patient in the floor banging on her dresser

## 2016-10-31 DIAGNOSIS — Z79899 Other long term (current) drug therapy: Secondary | ICD-10-CM

## 2016-10-31 DIAGNOSIS — F4325 Adjustment disorder with mixed disturbance of emotions and conduct: Secondary | ICD-10-CM | POA: Diagnosis present

## 2016-10-31 DIAGNOSIS — F1110 Opioid abuse, uncomplicated: Secondary | ICD-10-CM | POA: Diagnosis present

## 2016-10-31 DIAGNOSIS — Z9889 Other specified postprocedural states: Secondary | ICD-10-CM

## 2016-10-31 DIAGNOSIS — F17210 Nicotine dependence, cigarettes, uncomplicated: Secondary | ICD-10-CM

## 2016-10-31 MED ORDER — NICOTINE 21 MG/24HR TD PT24: 21.0000 mg | MEDICATED_PATCH | Freq: Every day | TRANSDERMAL | Status: DC

## 2016-10-31 MED ORDER — IBUPROFEN 200 MG PO TABS: 600.0000 mg | ORAL_TABLET | Freq: Three times a day (TID) | ORAL | Status: DC | PRN

## 2016-10-31 MED ORDER — ALUM & MAG HYDROXIDE-SIMETH 200-200-20 MG/5ML PO SUSP: 30.0000 mL | ORAL | Status: DC | PRN

## 2016-10-31 MED ORDER — TRAZODONE HCL 100 MG PO TABS: 100.0000 mg | ORAL_TABLET | Freq: Every day | ORAL | 0 refills | Status: DC

## 2016-10-31 MED ORDER — ACETAMINOPHEN 325 MG PO TABS: 650.0000 mg | ORAL_TABLET | ORAL | Status: DC | PRN

## 2016-10-31 MED ORDER — LORAZEPAM 1 MG PO TABS
1.0000 mg | ORAL_TABLET | Freq: Three times a day (TID) | ORAL | Status: DC | PRN
  Administered 2016-10-31: 1 mg via ORAL
  Filled 2016-10-31: qty 1

## 2016-10-31 MED ORDER — ONDANSETRON HCL 4 MG PO TABS: 4.0000 mg | ORAL_TABLET | Freq: Three times a day (TID) | ORAL | Status: DC | PRN

## 2016-10-31 MED ORDER — ALUM & MAG HYDROXIDE-SIMETH 200-200-20 MG/5ML PO SUSP
30.0000 mL | Freq: Once | ORAL | Status: AC
  Administered 2016-10-31: 30 mL via ORAL
  Filled 2016-10-31: qty 30

## 2016-10-31 MED ORDER — TRAZODONE HCL 100 MG PO TABS: 100.0000 mg | ORAL_TABLET | Freq: Every day | ORAL | Status: DC

## 2016-10-31 NOTE — ED Notes (Signed)
Pt. C/o anxiety. Snack and beverage given as well.

## 2016-10-31 NOTE — BH Assessment (Signed)
[facility] Assessment Progress Note  Per Thedore MinsMojeed Akintayo, MD, this pt does not require psychiatric hospitalization at this time.  [facility].  This has been included in pt's discharge instructions.  Pt's nurse, Dawnaly, has been notified.  Doylene Canninghomas Jonia Oakey, MA Triage Specialist [phone]

## 2016-10-31 NOTE — ED Notes (Signed)
Pt. Transferred to [facility] from ED to room 36 after screening for contraband. Report to include Situation, Background, Assessment and [facility]ations [facility]. Pt. Oriented to unit including Q15 minute rounds as well as the security cameras for their protection. Patient is alert and oriented, warm and dry in no acute distress. Patient denies SI, HI, and AVH. Pt. Encouraged to let me know if needs arise.

## 2016-10-31 NOTE — ED Notes (Signed)
Patient discharged to home with follow up recommendations.  Patient denies thoughts of harm to self or others.  All belongings returned and signed for.  She left the unit ambulatory and was escorted to the front lobby.

## 2016-10-31 NOTE — ED Notes (Signed)
Patient noted sleeping in room. No complaints, stable, in no acute distress. Q15 minute rounds and monitoring via Security Cameras to continue.  

## 2016-10-31 NOTE — ED Notes (Signed)
[facility].

## 2016-10-31 NOTE — ED Notes (Signed)
Dads number [phone]

## 2016-10-31 NOTE — ED Notes (Signed)
TTS in room talking to pt.

## 2016-10-31 NOTE — [facility] Suicide Risk Assessment (Signed)
Suicide Risk Assessment  Discharge Assessment   [facility]   Principal Problem: Adjustment disorder with mixed disturbance of emotions and conduct Discharge Diagnoses:  Patient Active Problem List   Diagnosis Date Noted  . 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 Adjustment disorder with mixed disturbance of emotions and conduct [F43.25] 10/31/2016    Priority: High    Total Time spent with patient: 45 minutes  Musculoskeletal: Strength & Muscle Tone: within normal limits Gait & Station: normal Patient leans: N/A  Psychiatric Specialty Exam: Physical Exam  Constitutional: She is oriented to person, place, and time. She appears well-developed and well-nourished.  HENT:  Head: Normocephalic.  Neck: Normal range of motion.  Respiratory: Effort normal.  Musculoskeletal: Normal range of motion.  Neurological: She is alert and oriented to person, place, and time.  Psychiatric: She has a normal mood and affect. Her speech is normal and behavior is normal. Judgment and thought content normal. Cognition and memory are normal.    Review of Systems  Constitutional: Negative.   HENT: Negative.   Eyes: Negative.   Respiratory: Negative.   Cardiovascular: Negative.   Gastrointestinal: Negative.   Genitourinary: Negative.   Musculoskeletal: Negative.   Skin: Negative.   Neurological: Negative.   Endo/Heme/Allergies: Negative.   Psychiatric/Behavioral: The patient has insomnia.     Blood pressure 97/64, pulse 98, temperature 98.3 F (36.8 C), temperature source Oral, resp. rate 16, SpO2 97 %.There is no height or weight on file to calculate BMI.  General Appearance: Disheveled  Eye Contact:  Good  Speech:  Normal Rate  Volume:  Normal  Mood:  Depressed, mild  Affect:  Congruent  Thought Process:  Coherent and Descriptions of Associations: Intact  Orientation:  Full (Time, Place, and Person)  Thought Content:  WDL  Suicidal  Thoughts:  No  Homicidal Thoughts:  No  Memory:  Immediate;   Good Recent;   Good Remote;   Good  Judgement:  Fair  Insight:  Fair  Psychomotor Activity:  Normal  Concentration:  Concentration: Good and Attention Span: Good  Recall:  Good  Fund of Knowledge:  Fair  Language:  Good  Akathisia:  No  Handed:  Right  AIMS (if indicated):     Assets:  Housing Leisure Time Physical Health Resilience Social Support  ADL's:  Intact  Cognition:  WNL  Sleep:       Mental Status Per Nursing Assessment::   On Admission:   heroin abuse with disturbance of mood and conduct  Demographic Factors:  Adolescent or young adult and Caucasian  Loss Factors: Legal issues  Historical Factors: NA  Risk Reduction Factors:   Responsible for children under 33 years of age, Sense of responsibility to family, Employed, Living with another person, especially a relative and Positive social support  Continued Clinical Symptoms:  None  Cognitive Features That Contribute To Risk:  None    Suicide Risk:  Minimal: No identifiable suicidal ideation.  Patients presenting with no risk factors but with morbid ruminations; may be classified as minimal risk based on the severity of the depressive symptoms    Plan Of Care/Follow-up recommendations:  Activity:  as tolerated Diet:  heart healthy diet  Malloree Raboin, NP 10/31/2016, 9:58 AM

## 2016-10-31 NOTE — Discharge Instructions (Signed)
For your ongoing behavioral health needs, you are advised to follow up with one of the following providers.  Contact them at your earliest opportunity to ask about enrolling in their program:       [facility]      [address]      [place], [place] [address]      [phone]     New patients are seen at their walk-in clinic.  Walk-in hours are Monday - Friday from 8:00 am - 12:00 pm, and from 1:00 pm - 3:00 pm.  Walk-in patients are seen on a first come, first served basis, so try to arrive as early as possible for the best chance of being seen the same day.  There is an initial fee of $22.50.       [facility]      [address]      [place], [place] [address]      [phone]
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
## 2016-12-05 ENCOUNTER — Emergency Department (HOSPITAL_BASED_OUTPATIENT_CLINIC_OR_DEPARTMENT_OTHER): Payer: Self-pay

## 2016-12-05 ENCOUNTER — Encounter (HOSPITAL_BASED_OUTPATIENT_CLINIC_OR_DEPARTMENT_OTHER): Payer: Self-pay | Admitting: *Deleted

## 2016-12-05 ENCOUNTER — Emergency Department (HOSPITAL_BASED_OUTPATIENT_CLINIC_OR_DEPARTMENT_OTHER)
Admission: EM | Admit: 2016-12-05 | Discharge: 2016-12-05 | Disposition: A | Discharge status: Home / Self Care | Payer: Self-pay | Attending: Emergency Medicine | Admitting: Emergency Medicine

## 2016-12-05 DIAGNOSIS — F17210 Nicotine dependence, cigarettes, uncomplicated: Secondary | ICD-10-CM | POA: Insufficient documentation

## 2016-12-05 DIAGNOSIS — K2900 Acute gastritis without bleeding: Secondary | ICD-10-CM | POA: Insufficient documentation

## 2016-12-05 DIAGNOSIS — J069 Acute upper respiratory infection, unspecified: Secondary | ICD-10-CM | POA: Insufficient documentation

## 2016-12-05 HISTORY — DX: Gastric ulcer, unspecified as acute or chronic, without hemorrhage or perforation: K25.9

## 2016-12-05 LAB — PREGNANCY, URINE: PREG TEST UR: NEGATIVE

## 2016-12-05 LAB — URINALYSIS, ROUTINE W REFLEX MICROSCOPIC
Glucose, UA: NEGATIVE mg/dL
KETONES UR: 15 mg/dL — AB
LEUKOCYTES UA: NEGATIVE
NITRITE: NEGATIVE
PROTEIN: 100 mg/dL — AB
Specific Gravity, Urine: 1.029 (ref 1.005–1.030)
pH: 6 (ref 5.0–8.0)

## 2016-12-05 LAB — CBC WITH DIFFERENTIAL/PLATELET
Basophils Absolute: 0 10*3/uL (ref 0.0–0.1)
Basophils Relative: 0 %
EOS ABS: 0 10*3/uL (ref 0.0–0.7)
EOS PCT: 0 %
HCT: 41.4 % (ref 36.0–46.0)
Hemoglobin: 14.4 g/dL (ref 12.0–15.0)
LYMPHS ABS: 0.7 10*3/uL (ref 0.7–4.0)
Lymphocytes Relative: 14 %
MCH: 30 pg (ref 26.0–34.0)
MCHC: 34.8 g/dL (ref 30.0–36.0)
MCV: 86.3 fL (ref 78.0–100.0)
MONO ABS: 0.5 10*3/uL (ref 0.1–1.0)
MONOS PCT: 11 %
NEUTROS ABS: 3.4 10*3/uL (ref 1.7–7.7)
NEUTROS PCT: 75 %
PLATELETS: 157 10*3/uL (ref 150–400)
RBC: 4.8 MIL/uL (ref 3.87–5.11)
RDW: 12.5 % (ref 11.5–15.5)
WBC: 4.6 10*3/uL (ref 4.0–10.5)

## 2016-12-05 LAB — COMPREHENSIVE METABOLIC PANEL
ALK PHOS: 47 U/L (ref 38–126)
ALT: 31 U/L (ref 14–54)
ANION GAP: 11 (ref 5–15)
AST: 34 U/L (ref 15–41)
Albumin: 4.5 g/dL (ref 3.5–5.0)
BUN: 11 mg/dL (ref 6–20)
CALCIUM: 8.9 mg/dL (ref 8.9–10.3)
CHLORIDE: 100 mmol/L — AB (ref 101–111)
CO2: 25 mmol/L (ref 22–32)
CREATININE: 0.83 mg/dL (ref 0.44–1.00)
Glucose, Bld: 109 mg/dL — ABNORMAL HIGH (ref 65–99)
Potassium: 3.2 mmol/L — ABNORMAL LOW (ref 3.5–5.1)
SODIUM: 136 mmol/L (ref 135–145)
Total Bilirubin: 1.2 mg/dL (ref 0.3–1.2)
Total Protein: 7.7 g/dL (ref 6.5–8.1)

## 2016-12-05 LAB — URINALYSIS, MICROSCOPIC (REFLEX)

## 2016-12-05 LAB — LIPASE, BLOOD: LIPASE: 20 U/L (ref 11–51)

## 2016-12-05 MED ORDER — MORPHINE SULFATE (PF) 4 MG/ML IV SOLN
4.0000 mg | Freq: Once | INTRAVENOUS | Status: AC
  Administered 2016-12-05: 4 mg via INTRAVENOUS
  Filled 2016-12-05: qty 1

## 2016-12-05 MED ORDER — SODIUM CHLORIDE 0.9 % IV BOLUS (SEPSIS)
1000.0000 mL | Freq: Once | INTRAVENOUS | Status: AC
  Administered 2016-12-05: 1000 mL via INTRAVENOUS

## 2016-12-05 MED ORDER — SUCRALFATE 1 GM/10ML PO SUSP: 1.0000 g | Freq: Three times a day (TID) | ORAL | 0 refills | Status: DC

## 2016-12-05 MED ORDER — GI COCKTAIL ~~LOC~~
30.0000 mL | Freq: Once | ORAL | Status: AC
  Administered 2016-12-05: 30 mL via ORAL
  Filled 2016-12-05: qty 30

## 2016-12-05 MED ORDER — PANTOPRAZOLE SODIUM 40 MG IV SOLR
40.0000 mg | Freq: Once | INTRAVENOUS | Status: AC
  Administered 2016-12-05: 40 mg via INTRAVENOUS
  Filled 2016-12-05: qty 40

## 2016-12-05 MED ORDER — HYDROCODONE-ACETAMINOPHEN 5-325 MG PO TABS
2.0000 | ORAL_TABLET | Freq: Once | ORAL | Status: AC
Start: 2016-12-05 — End: 2016-12-05
  Administered 2016-12-05: 2 via ORAL
  Filled 2016-12-05: qty 2

## 2016-12-05 MED ORDER — RANITIDINE HCL 150 MG PO TABS: 150.0000 mg | ORAL_TABLET | Freq: Two times a day (BID) | ORAL | 0 refills | Status: DC | PRN

## 2016-12-05 MED ORDER — ONDANSETRON HCL 4 MG PO TABS: 4.0000 mg | ORAL_TABLET | Freq: Three times a day (TID) | ORAL | 0 refills | Status: DC | PRN

## 2016-12-05 MED ORDER — PANTOPRAZOLE SODIUM 20 MG PO TBEC: 20.0000 mg | DELAYED_RELEASE_TABLET | Freq: Every day | ORAL | 0 refills | Status: DC

## 2016-12-05 MED ORDER — POTASSIUM CHLORIDE CRYS ER 20 MEQ PO TBCR: 40.0000 meq | EXTENDED_RELEASE_TABLET | Freq: Two times a day (BID) | ORAL | 0 refills | Status: DC

## 2016-12-05 MED ORDER — ONDANSETRON HCL 4 MG/2ML IJ SOLN
4.0000 mg | Freq: Once | INTRAMUSCULAR | Status: AC
Start: 2016-12-05 — End: 2016-12-05
  Administered 2016-12-05: 4 mg via INTRAVENOUS
  Filled 2016-12-05: qty 2

## 2016-12-05 MED ORDER — CYCLOBENZAPRINE HCL 10 MG PO TABS: 10.0000 mg | ORAL_TABLET | Freq: Three times a day (TID) | ORAL | 0 refills | Status: DC | PRN

## 2016-12-05 NOTE — ED Notes (Signed)
Pt is drinking gatorade, tolerating it well, gave her a push pop and saltines

## 2016-12-05 NOTE — ED Notes (Signed)
Pt taking small sips of gatorade at bedside.  States stomach pains re about 3/10 and back pain is about 7/10.

## 2016-12-05 NOTE — ED Provider Notes (Signed)
[facility] Provider Note   CSN: [id number] Arrival date & time: 12/05/16  1146     History   Chief Complaint Chief Complaint  Patient presents with  . Emesis  . Cough    HPI Debbie Griffin is a 36 y.o. female.  HPI 36 year old female with past medical history of gastric ulcers who presents with vomiting blood. Patient states that her symptoms started approximately 2 days ago as cough, congestion, and general fatigue. She took a small dose of over-the-counter cough medicine yesterday. She then immediately began developing epigastric aching, gnawing, severe pain that radiated to her back. She then began vomiting large amounts of grossly bloody emesis. She reports approximately 15 episodes of emesis throughout the day yesterday. She endorses associated nausea and has been unable to eat or drink overnight. This morning, her pain persists so she presents for evaluation. Denies any chest pain or shortness of breath. Denies any syncope or loss of consciousness. She is not on blood thinners.  Past Medical History:  Diagnosis Date  . Miscarriage   . Multiple gastric ulcers   . Ovarian cyst     Patient Active Problem List   Diagnosis Date Noted  . Opiate abuse, episodic 10/31/2016  . 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 ESOPHAGOGASTRODUODENOSCOPY    . WISDOM TOOTH EXTRACTION      OB History    Gravida Para Term Preterm AB Living   2 1 1   1 1    SAB TAB Ectopic Multiple Live Births   1               Home Medications    Prior to Admission medications   Medication Sig Start Date End Date Taking? Authorizing Provider  cyclobenzaprine (FLEXERIL) 10 MG tablet Take 1 tablet (10 mg total) by mouth 3 (three) times daily as needed for muscle spasms. 12/05/16   Shaune Pollackameron Menucha Dicesare, MD  ondansetron (ZOFRAN) 4 MG tablet Take 1 tablet (4 mg total) by mouth every 8 (eight) hours as  needed for nausea or vomiting. 12/05/16   Shaune Pollackameron Kodah Maret, MD  pantoprazole (PROTONIX) 20 MG tablet Take 1 tablet (20 mg total) by mouth daily. 12/05/16 12/19/16  Shaune Pollackameron Quinci Gavidia, MD  potassium chloride SA (K-DUR,KLOR-CON) 20 MEQ tablet Take 2 tablets (40 mEq total) by mouth 2 (two) times daily. 12/05/16 12/06/16  Shaune Pollackameron Menucha Dicesare, MD  ranitidine (ZANTAC) 150 MG tablet Take 1 tablet (150 mg total) by mouth 2 (two) times daily as needed for heartburn. 12/05/16   Shaune Pollackameron Warnie Belair, MD  sucralfate (CARAFATE) 1 GM/10ML suspension Take 10 mLs (1 g total) by mouth 4 (four) times daily -  with meals and at bedtime. 12/05/16   Shaune Pollackameron Laurabelle Gorczyca, MD  traMADol (ULTRAM) 50 MG tablet Take 1 tablet (50 mg total) by mouth every 8 (eight) hours as needed. Patient not taking: Reported on 10/31/2016 04/19/16   Helane GuntherGregory Mayer, DPM  traZODone (DESYREL) 100 MG tablet Take 1 tablet (100 mg total) by mouth at bedtime. 10/31/16   Charm RingsJamison Y Lord, NP    Family History Family History  Problem Relation Age of Onset  . Other Neg Hx     Social History Social History  Substance Use Topics  . Smoking status: Current Some Day Smoker    Types: Cigarettes  . Smokeless tobacco: Never Used  . Alcohol use Yes     Comment: rarely     Allergies  Patient has no known allergies.   Review of Systems Review of Systems  Constitutional: Positive for fatigue. Negative for chills and fever.  HENT: Negative for congestion and rhinorrhea.   Eyes: Negative for visual disturbance.  Respiratory: Negative for cough, shortness of breath and wheezing.   Cardiovascular: Positive for chest pain. Negative for leg swelling.  Gastrointestinal: Positive for abdominal pain, nausea and vomiting. Negative for diarrhea.  Genitourinary: Negative for dysuria and flank pain.  Musculoskeletal: Negative for neck pain and neck stiffness.  Skin: Negative for rash and wound.  Allergic/Immunologic: Negative for immunocompromised state.  Neurological: Negative for  syncope, weakness and headaches.  All other systems reviewed and are negative.    Physical Exam Updated Vital Signs BP 111/77 (BP Location: Right Arm)   Pulse 99   Temp 98.5 F (36.9 C) (Oral)   Resp 16   Ht 5\' 7"  (1.702 m)   Wt 155 lb (70.3 kg)   LMP 12/03/2016 (Exact Date)   SpO2 100%   BMI 24.28 kg/m   Physical Exam  Constitutional: She is oriented to person, place, and time. She appears well-developed and well-nourished. No distress.  HENT:  Head: Normocephalic and atraumatic.  Eyes: Conjunctivae are normal.  Neck: Neck supple.  Cardiovascular: Normal rate, regular rhythm and normal heart sounds.  Exam reveals no friction rub.   No murmur heard. Pulmonary/Chest: Effort normal and breath sounds normal. No respiratory distress. She has no wheezes. She has no rales.  Abdominal: Soft. She exhibits no distension. There is tenderness (moderate, epigastric). There is no rebound and no guarding.  Musculoskeletal: She exhibits no edema.  Neurological: She is alert and oriented to person, place, and time. She exhibits normal muscle tone.  Skin: Skin is warm. Capillary refill takes less than 2 seconds.  Psychiatric: She has a normal mood and affect.  Nursing note and vitals reviewed.    ED Treatments / Results  Labs (all labs ordered are listed, but only abnormal results are displayed) Labs Reviewed  URINALYSIS, ROUTINE W REFLEX MICROSCOPIC - Abnormal; Notable for the following:       Result Value   Color, Urine AMBER (*)    APPearance CLOUDY (*)    Hgb urine dipstick SMALL (*)    Bilirubin Urine SMALL (*)    Ketones, ur 15 (*)    Protein, ur 100 (*)    All other components within normal limits  COMPREHENSIVE METABOLIC PANEL - Abnormal; Notable for the following:    Potassium 3.2 (*)    Chloride 100 (*)    Glucose, Bld 109 (*)    All other components within normal limits  URINALYSIS, MICROSCOPIC (REFLEX) - Abnormal; Notable for the following:    Bacteria, UA MANY (*)     Squamous Epithelial / LPF 6-30 (*)    All other components within normal limits  PREGNANCY, URINE  CBC WITH DIFFERENTIAL/PLATELET  LIPASE, BLOOD    EKG  EKG Interpretation None       Radiology Dg Abdomen Acute W/chest  Result Date: 12/05/2016 CLINICAL DATA:  Patient states she developed back pain and a nonproductive cough two days ago. States the minute she took some cough medication, she immediately started to vomit bright red blood. Vomited 15 times in 24 hours, hx of gastric ulcers, no other complaints. EXAM: DG ABDOMEN ACUTE W/ 1V CHEST COMPARISON:  09/11/2014 FINDINGS: Cardiomediastinal silhouette is normal. The lungs are clear. There is no free intraperitoneal air beneath diaphragm. Supine and erect views of the abdomen demonstrate  nonobstructive bowel gas pattern. There is moderate stool burden in nondilated loops of colon. Intrauterine device is identified in the central pelvis. Visualized osseous structures have a normal appearance. No abnormal calcifications. IMPRESSION: 1.  No evidence for acute cardiopulmonary abnormality. 2.  Moderate stool burden. Electronically Signed   By: Norva Pavlov M.D.   On: 12/05/2016 12:53    Procedures Procedures (including critical care time)  Medications Ordered in ED Medications  sodium chloride 0.9 % bolus 1,000 mL (0 mLs Intravenous Stopped 12/05/16 1429)  gi cocktail (Maalox,Lidocaine,Donnatal) (30 mLs Oral Given 12/05/16 1232)  pantoprazole (PROTONIX) injection 40 mg (40 mg Intravenous Given 12/05/16 1233)  morphine 4 MG/ML injection 4 mg (4 mg Intravenous Given 12/05/16 1424)  ondansetron (ZOFRAN) injection 4 mg (4 mg Intravenous Given 12/05/16 1424)  HYDROcodone-acetaminophen (NORCO/VICODIN) 5-325 MG per tablet 2 tablet (2 tablets Oral Given 12/05/16 1502)     Initial Impression / Assessment and Plan / ED Course  I have reviewed the triage vital signs and the nursing notes.  Pertinent labs & imaging results that were available during my  care of the patient were reviewed by me and considered in my medical decision making (see chart for details).  Clinical Course     36 yo F with PMHx of recurrent gastritis/PUD here with nausea, vomiting in setting of URI. On arrival, VSS and WNL. Abdomen is soft, minimally tender, without guarding. Lab work shows normal WBC, normal Hgb. CMP with mild hypokalemia, likely 2/2 vomiting, with normal BUN. Lipase wnl. UA c/w mild dehydration. AAS neg for acute abnormality.  Suspect pt's sx are 2/2 viral URI with possible component of superimposed gastritis 2/2 taking Goody's and OTC NSAIDs for her cold. She has a normal Hgb, stable vitals, normal BUN, with no hematemesis x 24 hours - doubt acute, significant UGIB. Will d/w [facility], pt's GI specialists. Otherwise, sx improving with IVF and GI cocktail. PPI given.  D/w GI. Given young age, well appearance, stabe vitals, stable Hgb, normal BUN, no risk factors for significant GI bleed, feel pt is safe for outpt management. D/w pt who is in agreement. She is well appearing, tolerating PO, and sx improved with GI cocktail. Will d/c home.  Final Clinical Impressions(s) / ED Diagnoses   Final diagnoses:  Acute superficial gastritis without hemorrhage  Viral upper respiratory tract infection    New Prescriptions Discharge Medication List as of 12/05/2016  3:00 PM    START taking these medications   Details  cyclobenzaprine (FLEXERIL) 10 MG tablet Take 1 tablet (10 mg total) by mouth 3 (three) times daily as needed for muscle spasms., Starting Mon 12/05/2016, Print    ondansetron (ZOFRAN) 4 MG tablet Take 1 tablet (4 mg total) by mouth every 8 (eight) hours as needed for nausea or vomiting., Starting Mon 12/05/2016, Print    pantoprazole (PROTONIX) 20 MG tablet Take 1 tablet (20 mg total) by mouth daily., Starting Mon 12/05/2016, Until Mon 12/19/2016, Print    ranitidine (ZANTAC) 150 MG tablet Take 1 tablet (150 mg total) by mouth 2 (two) times daily as needed  for heartburn., Starting Mon 12/05/2016, Print    sucralfate (CARAFATE) 1 GM/10ML suspension Take 10 mLs (1 g total) by mouth 4 (four) times daily -  with meals and at bedtime., Starting Mon 12/05/2016, Print         Shaune Pollack, MD 12/05/16 [phone]

## 2016-12-05 NOTE — ED Notes (Addendum)
Pt has tolerated all PO well.

## 2016-12-05 NOTE — ED Triage Notes (Signed)
Patient states she developed back pain and a nonproductive cough two days ago.  States the minute she took some cough medication, she immediately started to vomit bright red blood.  Vomited 15 times in 24 hours.  History of stomach ulcer.

## 2016-12-05 NOTE — Discharge Instructions (Signed)
-   Take the medications as prescribed - Return to the ER immediately if you start vomiting blood again - Call GI for an appointment this week  - DO NOT TAKE ANY ASPIRIN, GOODY'S, IBUPROFEN, ALLEVE, or other NSAID drugs

## 2017-08-04 ENCOUNTER — Encounter: Payer: Self-pay | Admitting: Family Medicine

## 2017-08-04 ENCOUNTER — Ambulatory Visit (INDEPENDENT_AMBULATORY_CARE_PROVIDER_SITE_OTHER): Payer: Self-pay | Admitting: Family Medicine

## 2017-08-04 ENCOUNTER — Ambulatory Visit (HOSPITAL_BASED_OUTPATIENT_CLINIC_OR_DEPARTMENT_OTHER)
Admission: RE | Admit: 2017-08-04 | Discharge: 2017-08-04 | Disposition: A | Discharge status: Home / Self Care | Payer: Self-pay | Source: Ambulatory Visit | Attending: Family Medicine | Admitting: Family Medicine

## 2017-08-04 VITALS — BP 145/120 | HR 147 | Ht 68.0 in | Wt 160.0 lb

## 2017-08-04 DIAGNOSIS — S8992XA Unspecified injury of left lower leg, initial encounter: Secondary | ICD-10-CM

## 2017-08-04 DIAGNOSIS — M25562 Pain in left knee: Secondary | ICD-10-CM | POA: Insufficient documentation

## 2017-08-04 DIAGNOSIS — M25462 Effusion, left knee: Secondary | ICD-10-CM | POA: Insufficient documentation

## 2017-08-04 DIAGNOSIS — S8992XD Unspecified injury of left lower leg, subsequent encounter: Secondary | ICD-10-CM | POA: Insufficient documentation

## 2017-08-04 MED ORDER — MELOXICAM 7.5 MG PO TABS: 7.5000 mg | ORAL_TABLET | Freq: Every day | ORAL | 1 refills | Status: DC

## 2017-08-04 MED ORDER — CYCLOBENZAPRINE HCL 10 MG PO TABS: 10.0000 mg | ORAL_TABLET | Freq: Three times a day (TID) | ORAL | 1 refills | Status: DC | PRN

## 2017-08-04 MED FILL — CYCLOBENZAPRINE 10 MG TAB: 10 | 20 days supply | Qty: 60 | Fill #0

## 2017-08-04 MED FILL — MELOXICAM 7.5 MG TABLET: 7.5 | 30 days supply | Qty: 30 | Fill #0

## 2017-08-04 NOTE — Assessment & Plan Note (Signed)
independently reviewed radiographs and no fractures.  She is guarding some on exam but ligamentous structures intact.  Positive patellar apprehension and with her mechanism, effusion, consistent with patellar dislocation.  Immobilizer.  Icing, meloxicam (low dose with history of ulcers), elevation.  Flexeril as needed for spasms.  Basic quad strengthening.  F/u in 2 weeks for reevaluation.

## 2017-08-04 NOTE — Patient Instructions (Signed)
You have had a patellar dislocation (kneecap). Ice the area for 15 minutes at a time 3-4 times a day. Elevate above the level of your heart when possible. Take meloxicam 7.5mg  daily with food for pain and inflammation. Flexeril as needed for spasms, to help you sleep Knee immobilizer for 2 weeks then return for follow-up. Quad sets and straight leg raises for first 2-3 weeks, 3 sets of 10 once a day. We will consider hinged knee brace after the immobilization period. We will start physical therapy when you return for follow-up. Get the cone coverage between today's visit and in 2 weeks.

## 2017-08-04 NOTE — Progress Notes (Signed)
PCP: Patient, No Pcp Per  Subjective:   HPI: Patient is a 36 y.o. female here for left knee injury.  Patient reports on 8/29 she was playing soccer. She was hit by another player across medial aspect of left knee. Caused severe pain, swelling of left knee and felt like this knee went laterally. PT on sight thought she may have injured kneecap but said her knee overall was very loose. No prior injuries to this knee. Pain level now 6/10, anterior and sharp. Taking ibuprofen.  Past Medical History:  Diagnosis Date  . Miscarriage   . Multiple gastric ulcers   . Ovarian cyst     Current Outpatient Prescriptions on File Prior to Visit  Medication Sig Dispense Refill  . pantoprazole (PROTONIX) 20 MG tablet Take 1 tablet (20 mg total) by mouth daily. 14 tablet 0  . potassium chloride SA (K-DUR,KLOR-CON) 20 MEQ tablet Take 2 tablets (40 mEq total) by mouth 2 (two) times daily. 4 tablet 0  . ranitidine (ZANTAC) 150 MG tablet Take 1 tablet (150 mg total) by mouth 2 (two) times daily as needed for heartburn. 30 tablet 0  . sucralfate (CARAFATE) 1 GM/10ML suspension Take 10 mLs (1 g total) by mouth 4 (four) times daily -  with meals and at bedtime. 420 mL 0  . traZODone (DESYREL) 100 MG tablet Take 1 tablet (100 mg total) by mouth at bedtime. 30 tablet 0   No current facility-administered medications on file prior to visit.     Past Surgical History:  Procedure Laterality Date  . ESOPHAGOGASTRODUODENOSCOPY    . WISDOM TOOTH EXTRACTION      No Known Allergies  Social History   Social History  . Marital status: Divorced    Spouse name: N/A  . Number of children: N/A  . Years of education: N/A   Occupational History  . Not on file.   Social History Main Topics  . Smoking status: Current Some Day Smoker    Types: Cigarettes  . Smokeless tobacco: Never Used  . Alcohol use Yes     Comment: rarely  . Drug use: Yes     Comment: heroin  . Sexual activity: Yes    Birth control/  protection: None, IUD   Other Topics Concern  . Not on file   Social History Narrative  . No narrative on file    Family History  Problem Relation Age of Onset  . Other Neg Hx     BP (!) 145/120   Pulse (!) 147   Ht 5\' 8"  (1.727 m)   Wt 160 lb (72.6 kg)   LMP 07/21/2017   BMI 24.33 kg/m   Review of Systems: See HPI above.     Objective:  Physical Exam:  Gen: NAD, comfortable in exam room  Left knee: Large effusion.  No other deformity, ecchymoses. TTP greatest suprapatellar pouch.  Less medial joint line.  No other tenderness. ROM limited to 0-90 degrees. Negative ant/post drawers. Negative valgus/varus testing. Negative lachmanns. Negative mcmurrays, apleys.  Positive patellar apprehension. NV intact distally.  Right knee: FROM without pain.  MSK u/s:  Patellar and quad tendons intact.  Effusion confirmed, large.  Assessment & Plan:  1. Left knee injury - independently reviewed radiographs and no fractures.  She is guarding some on exam but ligamentous structures intact.  Positive patellar apprehension and with her mechanism, effusion, consistent with patellar dislocation.  Immobilizer.  Icing, meloxicam (low dose with history of ulcers), elevation.  Flexeril as needed  for spasms.  Basic quad strengthening.  F/u in 2 weeks for reevaluation.  2. Hypertension, tachycardia - question if machine readings are accurate.  No prior history of elevated blood pressure or tachycardia.  Level of pain should not elevate readings this much.  Plan to recheck early next week if not manually rechecked in office today.

## 2017-08-15 ENCOUNTER — Ambulatory Visit: Payer: Self-pay | Admitting: Family Medicine

## 2017-08-17 ENCOUNTER — Ambulatory Visit: Payer: Self-pay | Admitting: Family Medicine

## 2017-08-22 ENCOUNTER — Encounter: Payer: Self-pay | Admitting: Family Medicine

## 2017-08-22 ENCOUNTER — Ambulatory Visit (INDEPENDENT_AMBULATORY_CARE_PROVIDER_SITE_OTHER): Payer: Self-pay | Admitting: Family Medicine

## 2017-08-22 DIAGNOSIS — S8992XD Unspecified injury of left lower leg, subsequent encounter: Secondary | ICD-10-CM

## 2017-08-22 NOTE — Patient Instructions (Signed)
You have had a patellar dislocation (kneecap). Ice the area for 15 minutes at a time 3-4 times a day. Continue meloxicam with flexeril as needed. We will switch you to a hinged knee brace today. Quad set, straight leg raises. Ok to bend the knee to 90 degrees now and do knee extensions. Let us know when the cone coverage goes through and we will start you with physical therapy. Follow up with me in 4 weeks.

## 2017-08-23 NOTE — Assessment & Plan Note (Signed)
Radiographs negative.  Exam improved compared to last visit.  Continue meloxicam, icing. Switch to hinged knee brace.  continue home exercises while awaiting cone coverage going through then will start physical therapy.  F/u in 4 weeks.

## 2017-08-23 NOTE — Progress Notes (Signed)
PCP: Patient, No Pcp Per  Subjective:   HPI: Patient is a 36 y.o. female here for left knee injury.  8/31: Patient reports on 8/29 she was playing soccer. She was hit by another player across medial aspect of left knee. Caused severe pain, swelling of left knee and felt like this knee went laterally. PT on sight thought she may have injured kneecap but said her knee overall was very loose. No prior injuries to this knee. Pain level now 6/10, anterior and sharp. Taking ibuprofen.  9/18: Patient reports her pain feels about the same. Pain level 4/10, sharp, anterior. Feels stiff. Wearing immobilizer regularly. Taking mobic and flexeril. No skin changes. Swelling has improved. No skin changes, numbness.  Past Medical History:  Diagnosis Date  . Miscarriage   . Multiple gastric ulcers   . Ovarian cyst     Current Outpatient Prescriptions on File Prior to Visit  Medication Sig Dispense Refill  . cyclobenzaprine (FLEXERIL) 10 MG tablet Take 1 tablet (10 mg total) by mouth 3 (three) times daily as needed for muscle spasms. 60 tablet 1  . meloxicam (MOBIC) 7.5 MG tablet Take 1 tablet (7.5 mg total) by mouth daily. 30 tablet 1  . pantoprazole (PROTONIX) 20 MG tablet Take 1 tablet (20 mg total) by mouth daily. 14 tablet 0  . potassium chloride SA (K-DUR,KLOR-CON) 20 MEQ tablet Take 2 tablets (40 mEq total) by mouth 2 (two) times daily. 4 tablet 0  . ranitidine (ZANTAC) 150 MG tablet Take 1 tablet (150 mg total) by mouth 2 (two) times daily as needed for heartburn. 30 tablet 0  . sucralfate (CARAFATE) 1 GM/10ML suspension Take 10 mLs (1 g total) by mouth 4 (four) times daily -  with meals and at bedtime. 420 mL 0  . traZODone (DESYREL) 100 MG tablet Take 1 tablet (100 mg total) by mouth at bedtime. 30 tablet 0   No current facility-administered medications on file prior to visit.     Past Surgical History:  Procedure Laterality Date  . ESOPHAGOGASTRODUODENOSCOPY    . WISDOM  TOOTH EXTRACTION      No Known Allergies  Social History   Social History  . Marital status: Divorced    Spouse name: N/A  . Number of children: N/A  . Years of education: N/A   Occupational History  . Not on file.   Social History Main Topics  . Smoking status: Current Some Day Smoker    Types: Cigarettes  . Smokeless tobacco: Never Used  . Alcohol use Yes     Comment: rarely  . Drug use: Yes     Comment: heroin  . Sexual activity: Yes    Birth control/ protection: None, IUD   Other Topics Concern  . Not on file   Social History Narrative  . No narrative on file    Family History  Problem Relation Age of Onset  . Other Neg Hx     BP 115/76   Pulse 85   Ht  (1.727 m)   Wt 155 lb (70.3 kg)   BMI 23.57 kg/m   Review of Systems: See HPI above.     Objective:  Physical Exam:  Gen: NAD, comfortable in exam room  Left knee: Mild effusion.  No other deformity, ecchymoses. Minimal tenderness suprapatellar pouch.  No other tenderness. ROM limited to 0-90 degrees - did not test beyond this. Negative ant/post drawers. Negative valgus/varus testing. Negative lachmanns. Negative mcmurrays, apleys.  Trace positive patellar apprehension. NV  intact distally.  Right knee: FROM without pain.  Assessment & Plan:  1. Left knee injury - Radiographs negative.  Exam improved compared to last visit.  Continue meloxicam, icing. Switch to hinged knee brace.  continue home exercises while awaiting cone coverage going through then will start physical therapy.  F/u in 4 weeks.

## 2017-09-04 ENCOUNTER — Ambulatory Visit: Payer: Self-pay

## 2017-09-16 ENCOUNTER — Emergency Department (HOSPITAL_BASED_OUTPATIENT_CLINIC_OR_DEPARTMENT_OTHER)
Admission: EM | Admit: 2017-09-16 | Discharge: 2017-09-16 | Disposition: A | Discharge status: Home / Self Care | Payer: Self-pay | Attending: Emergency Medicine | Admitting: Emergency Medicine

## 2017-09-16 ENCOUNTER — Emergency Department (HOSPITAL_BASED_OUTPATIENT_CLINIC_OR_DEPARTMENT_OTHER): Payer: Self-pay

## 2017-09-16 ENCOUNTER — Encounter (HOSPITAL_BASED_OUTPATIENT_CLINIC_OR_DEPARTMENT_OTHER): Payer: Self-pay | Admitting: *Deleted

## 2017-09-16 DIAGNOSIS — F4325 Adjustment disorder with mixed disturbance of emotions and conduct: Secondary | ICD-10-CM | POA: Insufficient documentation

## 2017-09-16 DIAGNOSIS — F1910 Other psychoactive substance abuse, uncomplicated: Secondary | ICD-10-CM | POA: Insufficient documentation

## 2017-09-16 DIAGNOSIS — Z79899 Other long term (current) drug therapy: Secondary | ICD-10-CM | POA: Insufficient documentation

## 2017-09-16 DIAGNOSIS — M5416 Radiculopathy, lumbar region: Secondary | ICD-10-CM | POA: Insufficient documentation

## 2017-09-16 DIAGNOSIS — F1290 Cannabis use, unspecified, uncomplicated: Secondary | ICD-10-CM | POA: Insufficient documentation

## 2017-09-16 DIAGNOSIS — F17210 Nicotine dependence, cigarettes, uncomplicated: Secondary | ICD-10-CM | POA: Insufficient documentation

## 2017-09-16 HISTORY — DX: Unspecified dislocation of unspecified patella, initial encounter: S83.006A

## 2017-09-16 LAB — CBC WITH DIFFERENTIAL/PLATELET
BASOS ABS: 0 10*3/uL (ref 0.0–0.1)
Basophils Relative: 0 %
EOS ABS: 0.1 10*3/uL (ref 0.0–0.7)
EOS PCT: 2 %
HCT: 35.8 % — ABNORMAL LOW (ref 36.0–46.0)
Hemoglobin: 12.7 g/dL (ref 12.0–15.0)
LYMPHS PCT: 17 %
Lymphs Abs: 1.5 10*3/uL (ref 0.7–4.0)
MCH: 31.5 pg (ref 26.0–34.0)
MCHC: 35.5 g/dL (ref 30.0–36.0)
MCV: 88.8 fL (ref 78.0–100.0)
Monocytes Absolute: 0.7 10*3/uL (ref 0.1–1.0)
Monocytes Relative: 8 %
Neutro Abs: 6.5 10*3/uL (ref 1.7–7.7)
Neutrophils Relative %: 73 %
PLATELETS: 212 10*3/uL (ref 150–400)
RBC: 4.03 MIL/uL (ref 3.87–5.11)
RDW: 11.2 % — ABNORMAL LOW (ref 11.5–15.5)
WBC: 8.8 10*3/uL (ref 4.0–10.5)

## 2017-09-16 LAB — BASIC METABOLIC PANEL
ANION GAP: 7 (ref 5–15)
BUN: 18 mg/dL (ref 6–20)
CALCIUM: 8.9 mg/dL (ref 8.9–10.3)
CHLORIDE: 105 mmol/L (ref 101–111)
CO2: 23 mmol/L (ref 22–32)
Creatinine, Ser: 0.79 mg/dL (ref 0.44–1.00)
GFR calc Af Amer: >60 mL/min (ref >60)
GFR calc non Af Amer: >60 mL/min (ref >60)
GLUCOSE: 105 mg/dL — AB (ref 65–99)
Potassium: 5 mmol/L (ref 3.5–5.1)
Sodium: 135 mmol/L (ref 135–145)

## 2017-09-16 LAB — URINALYSIS, ROUTINE W REFLEX MICROSCOPIC
Bilirubin Urine: NEGATIVE
GLUCOSE, UA: NEGATIVE mg/dL
KETONES UR: NEGATIVE mg/dL
Leukocytes, UA: NEGATIVE
Nitrite: NEGATIVE
PH: 6 (ref 5.0–8.0)
PROTEIN: NEGATIVE mg/dL
Specific Gravity, Urine: <1.005 — ABNORMAL LOW (ref 1.005–1.030)

## 2017-09-16 LAB — URINALYSIS, MICROSCOPIC (REFLEX)

## 2017-09-16 LAB — PREGNANCY, URINE: Preg Test, Ur: NEGATIVE

## 2017-09-16 MED ORDER — LORAZEPAM 2 MG/ML IJ SOLN
1.0000 mg | Freq: Once | INTRAMUSCULAR | Status: AC
  Administered 2017-09-16: 1 mg via INTRAVENOUS
  Filled 2017-09-16: qty 1

## 2017-09-16 MED ORDER — KETOROLAC TROMETHAMINE 30 MG/ML IJ SOLN
30.0000 mg | Freq: Once | INTRAMUSCULAR | Status: AC
  Administered 2017-09-16: 30 mg via INTRAVENOUS
  Filled 2017-09-16: qty 1

## 2017-09-16 MED ORDER — CYCLOBENZAPRINE HCL 10 MG PO TABS: 10.0000 mg | ORAL_TABLET | Freq: Two times a day (BID) | ORAL | 0 refills | Status: DC | PRN

## 2017-09-16 MED ORDER — GADOBENATE DIMEGLUMINE 529 MG/ML IV SOLN
15.0000 mL | Freq: Once | INTRAVENOUS | Status: AC | PRN
  Administered 2017-09-16: 14 mL via INTRAVENOUS

## 2017-09-16 MED ORDER — PREDNISONE 20 MG PO TABS: ORAL_TABLET | ORAL | 0 refills | Status: DC

## 2017-09-16 NOTE — ED Notes (Signed)
Pt ambulates to the bathroom to void again 

## 2017-09-16 NOTE — ED Notes (Signed)
Patient transported to MRI 

## 2017-09-16 NOTE — ED Triage Notes (Signed)
Pt arrives in severe bilateral hip pain.  Pt denies any trauma and states that the pain began yesterday am and while sitting for 4 hours having her hair done yesterday she had increasing bilateral hip pain with pain shooting down both legs.  Pt states that nothing makes it better, pain is worse when sitting down, even worse when laying down and she states that it is severe and making her nauseated.  Pt states that the pain feels like it is in her bones.

## 2017-09-16 NOTE — ED Provider Notes (Signed)
[facility] Provider Note   CSN: [id number] Arrival date & time: 09/16/17  0850     History   Chief Complaint Chief Complaint  Patient presents with  . Hip Pain    HPI Debbie Griffin is a 36 y.o. female.Complains of low back pain radiating to bilateral hips and knees onset yesterday pain severe not made better or worse by anything. No fever no loss of bladder or bowel control no injury no nausea or vomiting no abdominal pain no other associated symptoms. Treated with ibuprofen without relief. Last menstrual period approximately one month ago. No other associated symptoms  HPI  Past Medical History:  Diagnosis Date  . Dislocation of the knee cap    left 2018  . Miscarriage   . Multiple gastric ulcers   . Ovarian cyst     Patient Active Problem List   Diagnosis Date Noted  . Left knee injury, subsequent encounter 08/04/2017  . Opiate abuse, episodic (HCC) 10/31/2016  . 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 ESOPHAGOGASTRODUODENOSCOPY    . WISDOM TOOTH EXTRACTION      OB History    Gravida Para Term Preterm AB Living   SAB TAB Ectopic Multiple Live Births   1               Home Medications    Prior to Admission medications   Medication Sig Start Date End Date Taking? Authorizing Provider  cyclobenzaprine (FLEXERIL) 10 MG tablet Take 1 tablet (10 mg total) by mouth 3 (three) times daily as needed for muscle spasms. 08/04/17   Hudnall, Azucena Fallen, MD  meloxicam (MOBIC) 7.5 MG tablet Take 1 tablet (7.5 mg total) by mouth daily. 08/04/17   Hudnall, Azucena Fallen, MD  pantoprazole (PROTONIX) 20 MG tablet Take 1 tablet (20 mg total) by mouth daily. 12/05/16 12/19/16  Shaune Pollack, MD  potassium chloride SA (K-DUR,KLOR-CON) 20 MEQ tablet Take 2 tablets (40 mEq total) by mouth 2 (two) times daily. 12/05/16 12/06/16  Shaune Pollack, MD  ranitidine (ZANTAC) 150 MG  tablet Take 1 tablet (150 mg total) by mouth 2 (two) times daily as needed for heartburn. 12/05/16   Shaune Pollack, MD  sucralfate (CARAFATE) 1 GM/10ML suspension Take 10 mLs (1 g total) by mouth 4 (four) times daily -  with meals and at bedtime. 12/05/16   Shaune Pollack, MD  traZODone (DESYREL) 100 MG tablet Take 1 tablet (100 mg total) by mouth at bedtime. 10/31/16   Charm Rings, NP    Family History Family History  Problem Relation Age of Onset  . Other Neg Hx     Social History Social History  Substance Use Topics  . Smoking status: Current Some Day Smoker    Types: Cigarettes  . Smokeless tobacco: Never Used  . Alcohol use Yes     Comment: rarely  IV drug use, heroin last time 6-8 months ago Moderate alcohol using alcohol approximate  2 times per week. Positive marijuana use  Allergies   Patient has no known allergies.   Review of Systems Review of Systems  Constitutional: Negative.   HENT: Negative.   Respiratory: Negative.   Cardiovascular: Negative.   Gastrointestinal: Negative.   Musculoskeletal: Positive for back pain and myalgias.  Skin: Negative.   Neurological: Negative.   Psychiatric/Behavioral: Negative.   All other systems reviewed and are negative.  Physical Exam Updated Vital Signs BP 136/90 (BP Location: Right Arm)   Pulse (!) 106   Temp 98.4 F (36.9 C) (Oral)   Resp 20   Ht  (1.727 m)   Wt 72.6 kg (160 lb)   LMP 09/16/2017   SpO2 98%   BMI 24.33 kg/m   Physical Exam  Constitutional: She is oriented to person, place, and time. She appears well-developed and well-nourished. She appears distressed.  Appears uncomfortable Glasgow Coma Score 15  HENT:  Head: Normocephalic and atraumatic.  Eyes: Pupils are equal, round, and reactive to light. Conjunctivae are normal.  Neck: Neck supple. No tracheal deviation present. No thyromegaly present.  Cardiovascular: Normal rate and regular rhythm.   No murmur heard. Pulmonary/Chest:  Effort normal and breath sounds normal.  Abdominal: Soft. Bowel sounds are normal. She exhibits no distension. There is no tenderness.  Musculoskeletal: Normal range of motion. She exhibits no edema or tenderness.  Back without point tenderness along spine. All 4 extremities without redness swelling or tenderness neurovascularly intact  Neurological: She is alert and oriented to person, place, and time. Coordination normal.  Date normal DTR's symmetric bilaterally at knee jerk ankle jerk. Toes downgoing bilaterally  Skin: Skin is warm and dry. No rash noted.  Psychiatric: She has a normal mood and affect.  Nursing note and vitals reviewed.    ED Treatments / Results  Labs (all labs ordered are listed, but only abnormal results are displayed) Labs Reviewed  URINALYSIS, ROUTINE W REFLEX MICROSCOPIC  PREGNANCY, URINE  BASIC METABOLIC PANEL  CBC WITH DIFFERENTIAL/PLATELET    EKG  EKG Interpretation None      Results for orders placed or performed during the hospital encounter of 09/16/17  Urinalysis, Routine w reflex microscopic  Result Value Ref Range   Color, Urine COLORLESS (A) YELLOW   APPearance CLEAR CLEAR   Specific Gravity, Urine <1.005 (L) 1.005 - 1.030   pH 6.0 5.0 - 8.0   Glucose, UA NEGATIVE NEGATIVE mg/dL   Hgb urine dipstick TRACE (A) NEGATIVE   Bilirubin Urine NEGATIVE NEGATIVE   Ketones, ur NEGATIVE NEGATIVE mg/dL   Protein, ur NEGATIVE NEGATIVE mg/dL   Nitrite NEGATIVE NEGATIVE   Leukocytes, UA NEGATIVE NEGATIVE  Pregnancy, urine  Result Value Ref Range   Preg Test, Ur NEGATIVE NEGATIVE  Basic metabolic panel  Result Value Ref Range   Sodium 135 135 - 145 mmol/L   Potassium 5.0 3.5 - 5.1 mmol/L   Chloride 105 101 - 111 mmol/L   CO2 23 22 - 32 mmol/L   Glucose, Bld 105 (H) 65 - 99 mg/dL   BUN 18 6 - 20 mg/dL   Creatinine, Ser 6.04 0.44 - 1.00 mg/dL   Calcium 8.9 8.9 - 54.0 mg/dL   GFR calc non Af Amer >60 >60 mL/min   GFR calc Af Amer >60 >60 mL/min    Anion gap 7 5 - 15  CBC with Differential/Platelet  Result Value Ref Range   WBC 8.8 4.0 - 10.5 K/uL   RBC 4.03 3.87 - 5.11 MIL/uL   Hemoglobin 12.7 12.0 - 15.0 g/dL   HCT 98.1 (L) 19.1 - 47.8 %   MCV 88.8 78.0 - 100.0 fL   MCH 31.5 26.0 - 34.0 pg   MCHC 35.5 30.0 - 36.0 g/dL   RDW 29.5 (L) 62.1 - 30.8 %   Platelets 212 150 - 400 K/uL   Neutrophils Relative % 73 %   Neutro Abs 6.5 1.7 - 7.7 K/uL  Lymphocytes Relative 17 %   Lymphs Abs 1.5 0.7 - 4.0 K/uL   Monocytes Relative 8 %   Monocytes Absolute 0.7 0.1 - 1.0 K/uL   Eosinophils Relative 2 %   Eosinophils Absolute 0.1 0.0 - 0.7 K/uL   Basophils Relative 0 %   Basophils Absolute 0.0 0.0 - 0.1 K/uL  Urinalysis, Microscopic (reflex)  Result Value Ref Range   RBC / HPF 0-5 0 - 5 RBC/hpf   WBC, UA 0-5 0 - 5 WBC/hpf   Bacteria, UA FEW (A) NONE SEEN   Squamous Epithelial / LPF 0-5 (A) NONE SEEN   Mr Lumbar Spine W Wo Contrast  Result Date: 09/16/2017 CLINICAL DATA:  Acute onset lower back and right hip pain starting yesterday. No numbness or tingling. No leg pain. EXAM: MRI LUMBAR SPINE WITHOUT AND WITH CONTRAST TECHNIQUE: Multiplanar and multiecho pulse sequences of the lumbar spine were obtained without and with intravenous contrast. CONTRAST:  14mL MULTIHANCE GADOBENATE DIMEGLUMINE 529 MG/ML IV SOLN COMPARISON:  None. FINDINGS: Despite efforts by the technologist and patient, motion artifact is present on today's exam and could not be eliminated. This reduces exam sensitivity and specificity. Segmentation:  Standard. Alignment:  Physiologic. Vertebrae:  No fracture, evidence of discitis, or bone lesion. Conus medullaris: Extends to the L2 level and appears normal. There is enhancement of the right ventral and dorsal L3 nerve roots. Paraspinal and other soft tissues: Negative. Disc levels: T11-T12: Only seen on the sagittal images.  Negative. T12-L1:  Only seen on the sagittal images.  Negative. L1-L2:  Negative. L2-L3:  Negative.  L3-L4:  Negative. L4-L5: Mild bilateral facet arthropathy. No spinal canal or neuroforaminal stenosis. L5-S1: Mild bilateral facet arthropathy. No spinal canal or neuroforaminal stenosis. IMPRESSION: 1. Nonspecific enhancement of the right ventral and dorsal L3 nerve roots, consistent with radiculitis. No other nerve root enhancement. 2. Mild lower lumbar facet arthropathy. No spinal canal or neuroforaminal stenosis at any level. Electronically Signed   By: Obie Dredge M.D.   On: 09/16/2017 12:11   Radiology No results found.  Procedures Procedures (including critical care time)  Medications Ordered in ED Medications  ketorolac (TORADOL) 30 MG/ML injection 30 mg (not administered)     Initial Impression / Assessment and Plan / ED Course  I have reviewed the triage vital signs and the nursing notes.  Pertinent labs & imaging results that were available during my care of the patient were reviewed by me and considered in my medical decision making (see chart for details).    12:50 PM pain much improved after treatment with intravenous Toradol and Ativan. She is awake and alert. Plan prescription prednisone, Flexeril. Referral Dr. Yetta Barre neurosurgery and referral to primary care MRI scan discussed with Dr.Derry who reports to me that despite motion artifact he is confident there is no spinal infection present  Final Clinical Impressions(s) / ED Diagnoses  Diagnosis lumbar radiculopathy Final diagnoses:  None    New Prescriptions New Prescriptions   No medications on file     Doug Sou, MD 09/16/17 1300

## 2017-09-16 NOTE — Discharge Instructions (Signed)
Take the medication as prescribed. Call Dr. Yetta Barre to schedule an office visit if your pain is not well controlled or you can call the number on these instructions to get a primary care physician

## 2017-09-16 NOTE — ED Notes (Signed)
Pt pacing in and out of treatment room, moaning.

## 2017-09-19 ENCOUNTER — Ambulatory Visit (INDEPENDENT_AMBULATORY_CARE_PROVIDER_SITE_OTHER): Payer: Self-pay | Admitting: Family Medicine

## 2017-09-19 ENCOUNTER — Encounter: Payer: Self-pay | Admitting: Family Medicine

## 2017-09-19 DIAGNOSIS — S8992XD Unspecified injury of left lower leg, subsequent encounter: Secondary | ICD-10-CM

## 2017-09-19 MED ORDER — CYCLOBENZAPRINE HCL 10 MG PO TABS: 10.0000 mg | ORAL_TABLET | Freq: Two times a day (BID) | ORAL | 1 refills | Status: DC | PRN

## 2017-09-19 NOTE — Patient Instructions (Signed)
You have had a patellar dislocation (kneecap). You're doing great clinically. Ok for jogging, running as tolerated. I'd wait about 2 weeks before doing cutting activities though. Icing, meloxicam if needed. I've sent in more flexeril. Finish the prednisone you have for your back. Use knee brace when you return to sports, with cutting activities. Straight leg raises, knee extensions, straight leg raises with foot turned outwards. Add ankle weight if these are too easy. Let us know when [facility] coverage goes through and we will order physical therapy. Follow up with me in 1 month.

## 2017-09-21 NOTE — Progress Notes (Signed)
PCP: Patient, No Pcp Per  Subjective:   HPI: Patient is a 36 y.o. female here for left knee injury.  8/31: Patient reports on 8/29 she was playing soccer. She was hit by another player across medial aspect of left knee. Caused severe pain, swelling of left knee and felt like this knee went laterally. PT on sight thought she may have injured kneecap but said her knee overall was very loose. No prior injuries to this knee. Pain level now 6/10, anterior and sharp. Taking ibuprofen.  9/18: Patient reports her pain feels about the same. Pain level 4/10, sharp, anterior. Feels stiff. Wearing immobilizer regularly. Taking mobic and flexeril. No skin changes. Swelling has improved. No skin changes, numbness.  10/16: Patient reports she's doing much better. Some stiffness in left knee but pain currently 0/10. Slight swelling. Gets pain if walking a lot. No skin changes, numbness.  Past Medical History:  Diagnosis Date  . Dislocation of the knee cap    left 2018  . Miscarriage   . Multiple gastric ulcers   . Ovarian cyst     Current Outpatient Prescriptions on File Prior to Visit  Medication Sig Dispense Refill  . meloxicam (MOBIC) 7.5 MG tablet Take 1 tablet (7.5 mg total) by mouth daily. 30 tablet 1  . pantoprazole (PROTONIX) 20 MG tablet Take 1 tablet (20 mg total) by mouth daily. 14 tablet 0  . potassium chloride SA (K-DUR,KLOR-CON) 20 MEQ tablet Take 2 tablets (40 mEq total) by mouth 2 (two) times daily. 4 tablet 0  . predniSONE (DELTASONE) 20 MG tablet 2 tabs po daily x 5 days 10 tablet 0  . ranitidine (ZANTAC) 150 MG tablet Take 1 tablet (150 mg total) by mouth 2 (two) times daily as needed for heartburn. 30 tablet 0  . sucralfate (CARAFATE) 1 GM/10ML suspension Take 10 mLs (1 g total) by mouth 4 (four) times daily -  with meals and at bedtime. 420 mL 0  . traZODone (DESYREL) 100 MG tablet Take 1 tablet (100 mg total) by mouth at bedtime. 30 tablet 0   No current  facility-administered medications on file prior to visit.     Past Surgical History:  Procedure Laterality Date  . ESOPHAGOGASTRODUODENOSCOPY    . WISDOM TOOTH EXTRACTION      No Known Allergies  Social History   Social History  . Marital status: Divorced    Spouse name: N/A  . Number of children: N/A  . Years of education: N/A   Occupational History  . Not on file.   Social History Main Topics  . Smoking status: Current Some Day Smoker    Types: Cigarettes  . Smokeless tobacco: Never Used  . Alcohol use Yes     Comment: rarely  . Drug use: No     Comment: heroin (past)  . Sexual activity: Yes    Birth control/ protection: None, IUD   Other Topics Concern  . Not on file   Social History Narrative  . No narrative on file    Family History  Problem Relation Age of Onset  . Other Neg Hx     BP 133/84   Pulse 99   Ht 5\' 8"  (1.727 m)   Wt 158 lb (71.7 kg)   LMP 09/16/2017   BMI 24.02 kg/m   Review of Systems: See HPI above.     Objective:  Physical Exam:  Gen: NAD, comfortable in exam room.  Left knee: No gross deformity, ecchymoses, effusion. No TTP  currently. FROM with 5/5 strength. Negative ant/post drawers. Negative valgus/varus testing. Negative lachmanns. Negative mcmurrays, apleys, patellar apprehension. NV intact distally.  Right knee: FROM without pain.  Assessment & Plan:  1. Left knee injury - Radiographs were negative.  2/2 patellar dislocation.  Clinically improved.  Ok for jogging, running now.  Icing, meloxicam, flexeril only if needed.  Knee brace for support when playing sports.  Shown home exercises to do daily - call us when [facility] coverage goes through to start physical therapy.  F/u in 1 month.

## 2017-09-21 NOTE — Assessment & Plan Note (Signed)
Radiographs were negative.  2/2 patellar dislocation.  Clinically improved.  Ok for jogging, running now.  Icing, meloxicam, flexeril only if needed.  Knee brace for support when playing sports.  Shown home exercises to do daily - call us when [facility] coverage goes through to start physical therapy.  F/u in 1 month.

## 2017-09-25 ENCOUNTER — Ambulatory Visit: Payer: Medicaid Other

## 2017-09-27 ENCOUNTER — Telehealth: Payer: Self-pay | Admitting: Family Medicine

## 2017-09-27 NOTE — Telephone Encounter (Signed)
Neither of those conditions prevent her from being able to drive.

## 2017-09-27 NOTE — Telephone Encounter (Signed)
Patient called requesting a letter to be faxed to her lawyer.  Patient states she has court today but is unable to drive due to her knee and a pinched nerve in her back.  Requesting a letter asap

## 2017-09-27 NOTE — Telephone Encounter (Signed)
Patient informed. 

## 2017-09-28 ENCOUNTER — Encounter: Payer: Self-pay | Admitting: Family Medicine

## 2017-09-28 ENCOUNTER — Ambulatory Visit (INDEPENDENT_AMBULATORY_CARE_PROVIDER_SITE_OTHER): Payer: Self-pay | Admitting: Family Medicine

## 2017-09-28 DIAGNOSIS — S8992XD Unspecified injury of left lower leg, subsequent encounter: Secondary | ICD-10-CM

## 2017-09-28 NOTE — Patient Instructions (Signed)
Your knee exam is reassuring that you've just suffered a contusion and not reaggravated your prior injury (dislocation). Call us when the coverage goes through and we'll put in an order for therapy for your knee and your back.

## 2017-10-01 NOTE — Assessment & Plan Note (Signed)
Exam is reassuring today.  Current injury only a contusion with her prior history of patellar dislocation.  When gets [facility] Coverage will go ahead with physical therapy - to call us when this is the case.  Icing, meloxicam, flexeril if needed.

## 2017-10-01 NOTE — Progress Notes (Signed)
PCP: Patient, No Pcp Per  Subjective:   HPI: Patient is a 36 y.o. female here for left knee injury.  8/31: Patient reports on 8/29 she was playing soccer. She was hit by another player across medial aspect of left knee. Caused severe pain, swelling of left knee and felt like this knee went laterally. PT on sight thought she may have injured kneecap but said her knee overall was very loose. No prior injuries to this knee. Pain level now 6/10, anterior and sharp. Taking ibuprofen.  9/18: Patient reports her pain feels about the same. Pain level 4/10, sharp, anterior. Feels stiff. Wearing immobilizer regularly. Taking mobic and flexeril. No skin changes. Swelling has improved. No skin changes, numbness.  10/16: Patient reports she's doing much better. Some stiffness in left knee but pain currently 0/10. Slight swelling. Gets pain if walking a lot. No skin changes, numbness.  10/25: Patient reports on 10/20 she had a sharp pinch in her low back causing her to fall and hit left knee onto the ground. Pain severely worsened in her left knee though has come down some to 5/10, still sharp. Worse with walking. Pain is anterior. Some swelling with this but has improved since the 20th. Concerned she may have reinjured the knee. No skin changes, numbness.  Past Medical History:  Diagnosis Date  . Dislocation of the knee cap    left 2018  . Miscarriage   . Multiple gastric ulcers   . Ovarian cyst     Current Outpatient Prescriptions on File Prior to Visit  Medication Sig Dispense Refill  . cyclobenzaprine (FLEXERIL) 10 MG tablet Take 1 tablet (10 mg total) by mouth 2 (two) times daily as needed for muscle spasms. 60 tablet 1  . meloxicam (MOBIC) 7.5 MG tablet Take 1 tablet (7.5 mg total) by mouth daily. 30 tablet 1  . pantoprazole (PROTONIX) 20 MG tablet Take 1 tablet (20 mg total) by mouth daily. 14 tablet 0  . potassium chloride SA (K-DUR,KLOR-CON) 20 MEQ tablet Take 2  tablets (40 mEq total) by mouth 2 (two) times daily. 4 tablet 0  . predniSONE (DELTASONE) 20 MG tablet 2 tabs po daily x 5 days 10 tablet 0  . ranitidine (ZANTAC) 150 MG tablet Take 1 tablet (150 mg total) by mouth 2 (two) times daily as needed for heartburn. 30 tablet 0  . sucralfate (CARAFATE) 1 GM/10ML suspension Take 10 mLs (1 g total) by mouth 4 (four) times daily -  with meals and at bedtime. 420 mL 0  . traZODone (DESYREL) 100 MG tablet Take 1 tablet (100 mg total) by mouth at bedtime. 30 tablet 0   No current facility-administered medications on file prior to visit.     Past Surgical History:  Procedure Laterality Date  . ESOPHAGOGASTRODUODENOSCOPY    . WISDOM TOOTH EXTRACTION      No Known Allergies  Social History   Social History  . Marital status: Divorced    Spouse name: N/A  . Number of children: N/A  . Years of education: N/A   Occupational History  . Not on file.   Social History Main Topics  . Smoking status: Current Some Day Smoker    Types: Cigarettes  . Smokeless tobacco: Never Used  . Alcohol use Yes     Comment: rarely  . Drug use: No     Comment: heroin (past)  . Sexual activity: Yes    Birth control/ protection: None, IUD   Other Topics Concern  .  Not on file   Social History Narrative  . No narrative on file    Family History  Problem Relation Age of Onset  . Other Neg Hx     BP 126/88   Pulse (!) 109   Ht 5\' 8"  (1.727 m)   Wt 160 lb (72.6 kg)   LMP 09/16/2017   BMI 24.33 kg/m   Review of Systems: See HPI above.     Objective:  Physical Exam:  Gen: NAD, comfortable in exam room.  Left knee: No gross deformity, ecchymoses, swelling. Mild TTP anterior knee.  No post patellar facet, joint line, other tenderness. FROM with 5/5 strength. Negative ant/post drawers. Negative valgus/varus testing. Negative lachmanns. Negative mcmurrays, apleys, patellar apprehension. NV intact distally.  Right knee: FROM without  pain.  Assessment & Plan:  1. Left knee injury - Exam is reassuring today.  Current injury only a contusion with her prior history of patellar dislocation.  When gets [facility] Coverage will go ahead with physical therapy - to call us when this is the case.  Icing, meloxicam, flexeril if needed.

## 2017-10-20 ENCOUNTER — Encounter: Payer: Self-pay | Admitting: Family Medicine

## 2017-10-20 ENCOUNTER — Ambulatory Visit (INDEPENDENT_AMBULATORY_CARE_PROVIDER_SITE_OTHER): Payer: Self-pay | Admitting: Family Medicine

## 2017-10-20 DIAGNOSIS — S8992XD Unspecified injury of left lower leg, subsequent encounter: Secondary | ICD-10-CM

## 2017-10-20 NOTE — Patient Instructions (Signed)
Call us when the coverage goes through and we'll put in an order for therapy for your knee and your back. A shield's brace would be ideal but I'm not sure which one you currently have - you could try to order one of these online if they're different than your current knee brace. Continue quad strengthening in the meantime.

## 2017-10-24 ENCOUNTER — Encounter: Payer: Self-pay | Admitting: Family Medicine

## 2017-10-24 NOTE — Progress Notes (Signed)
PCP: Patient, No Pcp Per  Subjective:   HPI: Patient is a 36 y.o. female here for left knee injury.  8/31: Patient reports on 8/29 she was playing soccer. She was hit by another player across medial aspect of left knee. Caused severe pain, swelling of left knee and felt like this knee went laterally. PT on sight thought she may have injured kneecap but said her knee overall was very loose. No prior injuries to this knee. Pain level now 6/10, anterior and sharp. Taking ibuprofen.  9/18: Patient reports her pain feels about the same. Pain level 4/10, sharp, anterior. Feels stiff. Wearing immobilizer regularly. Taking mobic and flexeril. No skin changes. Swelling has improved. No skin changes, numbness.  10/16: Patient reports she's doing much better. Some stiffness in left knee but pain currently 0/10. Slight swelling. Gets pain if walking a lot. No skin changes, numbness.  10/25: Patient reports on 10/20 she had a sharp pinch in her low back causing her to fall and hit left knee onto the ground. Pain severely worsened in her left knee though has come down some to 5/10, still sharp. Worse with walking. Pain is anterior. Some swelling with this but has improved since the 20th. Concerned she may have reinjured the knee. No skin changes, numbness.  11/16: Patient reports she's better though knee pops at times. Pain up to 3/10 and sharp anteriorly. Has not started therapy yet - working on getting cone coverage. 3 weeks ago she played soccer and felt the kneecap shifted again. + swelling. No skin changes, numbness.  Past Medical History:  Diagnosis Date  . Dislocation of the knee cap    left 2018  . Miscarriage   . Multiple gastric ulcers   . Ovarian cyst     Current Outpatient Medications on File Prior to Visit  Medication Sig Dispense Refill  . cyclobenzaprine (FLEXERIL) 10 MG tablet Take 1 tablet (10 mg total) by mouth 2 (two) times daily as needed for muscle  spasms. 60 tablet 1  . meloxicam (MOBIC) 7.5 MG tablet Take 1 tablet (7.5 mg total) by mouth daily. 30 tablet 1  . pantoprazole (PROTONIX) 20 MG tablet Take 1 tablet (20 mg total) by mouth daily. 14 tablet 0  . potassium chloride SA (K-DUR,KLOR-CON) 20 MEQ tablet Take 2 tablets (40 mEq total) by mouth 2 (two) times daily. 4 tablet 0  . ranitidine (ZANTAC) 150 MG tablet Take 1 tablet (150 mg total) by mouth 2 (two) times daily as needed for heartburn. 30 tablet 0  . sucralfate (CARAFATE) 1 GM/10ML suspension Take 10 mLs (1 g total) by mouth 4 (four) times daily -  with meals and at bedtime. 420 mL 0  . traZODone (DESYREL) 100 MG tablet Take 1 tablet (100 mg total) by mouth at bedtime. 30 tablet 0   No current facility-administered medications on file prior to visit.     Past Surgical History:  Procedure Laterality Date  . ESOPHAGOGASTRODUODENOSCOPY    . WISDOM TOOTH EXTRACTION      No Known Allergies  Social History   Socioeconomic History  . Marital status: Divorced    Spouse name: Not on file  . Number of children: Not on file  . Years of education: Not on file  . Highest education level: Not on file  Social Needs  . Financial resource strain: Not on file  . Food insecurity - worry: Not on file  . Food insecurity - inability: Not on file  . Transportation needs -  medical: Not on file  . Transportation needs - non-medical: Not on file  Occupational History  . Not on file  Tobacco Use  . Smoking status: Current Some Day Smoker    Types: Cigarettes  . Smokeless tobacco: Never Used  Substance and Sexual Activity  . Alcohol use: Yes    Comment: rarely  . Drug use: No    Comment: heroin (past)  . Sexual activity: Yes    Birth control/protection: None, IUD  Other Topics Concern  . Not on file  Social History Narrative  . Not on file    Family History  Problem Relation Age of Onset  . Other Neg Hx     BP 136/90   Pulse (!) 104   Ht 5\' 8"  (1.727 m)   Wt 160 lb  (72.6 kg)   BMI 24.33 kg/m   Review of Systems: See HPI above.     Objective:  Physical Exam:  Gen: NAD, comfortable in exam room.  Left knee: Mild effusion.  No other gross deformity, ecchymoses. No TTP. FROM with 5/5 strength. Negative ant/post drawers. Negative valgus/varus testing. Negative lachmanns. Negative mcmurrays, apleys, patellar apprehension. NV intact distally.  Right knee: FROM without pain.  Assessment & Plan:  1. Left knee injury - Patellar instability with history of dislocation.  Will contact us when cone coverage goes through so she can start physical therapy.  Discussed consideration of MRI as well. Icing, meloxicam, flexeril only if needed.  Knee brace for support.

## 2017-10-24 NOTE — Assessment & Plan Note (Signed)
Patellar instability with history of dislocation.  Will contact us when cone coverage goes through so she can start physical therapy.  Discussed consideration of MRI as well. Icing, meloxicam, flexeril only if needed.  Knee brace for support.

## 2017-11-06 ENCOUNTER — Encounter: Payer: Self-pay | Admitting: Family Medicine

## 2017-11-06 ENCOUNTER — Ambulatory Visit (INDEPENDENT_AMBULATORY_CARE_PROVIDER_SITE_OTHER): Payer: Self-pay | Admitting: Family Medicine

## 2017-11-06 ENCOUNTER — Telehealth: Payer: Self-pay | Admitting: Family Medicine

## 2017-11-06 DIAGNOSIS — S83105D Unspecified dislocation of left knee, subsequent encounter: Secondary | ICD-10-CM

## 2017-11-06 DIAGNOSIS — S8992XD Unspecified injury of left lower leg, subsequent encounter: Secondary | ICD-10-CM

## 2017-11-06 NOTE — Telephone Encounter (Signed)
Noted patient has card but does not have letter yet.  Seen today and will complete her office visit note.

## 2017-11-06 NOTE — Patient Instructions (Signed)
Get us the letter and we will go ahead with an MRI and physical therapy. Wear shield's brace (or whichever you can get through the physical therapist) when up and walking around. Continue quad strengthening in the meantime. Let me know if you want to do meloxicam daily. Ibuprofen 600mg  three times a day with food OR aleve 2 tabs twice a day with food for pain and inflammation. Tylenol 500mg  1-2 tabs three times a day as needed for pain.

## 2017-11-06 NOTE — Telephone Encounter (Signed)
Patient called to inform that she has been approved for [employer]FA. She would like to go ahead and start physical therapy for her knee as soon as possible.  Patient scheduled an appointment for this afternoon and will bring the CAFA approval letter at this time

## 2017-11-07 ENCOUNTER — Encounter: Payer: Self-pay | Admitting: Family Medicine

## 2017-11-07 NOTE — Progress Notes (Addendum)
PCP: Patient, No Pcp Per  Subjective:   HPI: Patient is a 36 y.o. female here for left knee injury.  8/31: Patient reports on 8/29 she was playing soccer. She was hit by another player across medial aspect of left knee. Caused severe pain, swelling of left knee and felt like this knee went laterally. PT on sight thought she may have injured kneecap but said her knee overall was very loose. No prior injuries to this knee. Pain level now 6/10, anterior and sharp. Taking ibuprofen.  9/18: Patient reports her pain feels about the same. Pain level 4/10, sharp, anterior. Feels stiff. Wearing immobilizer regularly. Taking mobic and flexeril. No skin changes. Swelling has improved. No skin changes, numbness.  10/16: Patient reports she's doing much better. Some stiffness in left knee but pain currently 0/10. Slight swelling. Gets pain if walking a lot. No skin changes, numbness.  10/25: Patient reports on 10/20 she had a sharp pinch in her low back causing her to fall and hit left knee onto the ground. Pain severely worsened in her left knee though has come down some to 5/10, still sharp. Worse with walking. Pain is anterior. Some swelling with this but has improved since the 20th. Concerned she may have reinjured the knee. No skin changes, numbness.  11/16: Patient reports she's better though knee pops at times. Pain up to 3/10 and sharp anteriorly. Has not started therapy yet - working on getting cone coverage. 3 weeks ago she played soccer and felt the kneecap shifted again. + swelling. No skin changes, numbness.  12/3: Patient returns reporting she had a recurrent injury on 11/22 when walking. + swelling, sore anteriorly. Pain level 6/10. Difficulty sleeping. No skin changes.  Past Medical History:  Diagnosis Date  . Dislocation of the knee cap    left 2018  . Miscarriage   . Multiple gastric ulcers   . Ovarian cyst     Current Outpatient Medications on  File Prior to Visit  Medication Sig Dispense Refill  . cyclobenzaprine (FLEXERIL) 10 MG tablet Take 1 tablet (10 mg total) by mouth 2 (two) times daily as needed for muscle spasms. 60 tablet 1  . meloxicam (MOBIC) 7.5 MG tablet Take 1 tablet (7.5 mg total) by mouth daily. 30 tablet 1  . pantoprazole (PROTONIX) 20 MG tablet Take 1 tablet (20 mg total) by mouth daily. 14 tablet 0  . potassium chloride SA (K-DUR,KLOR-CON) 20 MEQ tablet Take 2 tablets (40 mEq total) by mouth 2 (two) times daily. 4 tablet 0  . ranitidine (ZANTAC) 150 MG tablet Take 1 tablet (150 mg total) by mouth 2 (two) times daily as needed for heartburn. 30 tablet 0  . sucralfate (CARAFATE) 1 GM/10ML suspension Take 10 mLs (1 g total) by mouth 4 (four) times daily -  with meals and at bedtime. 420 mL 0  . traZODone (DESYREL) 100 MG tablet Take 1 tablet (100 mg total) by mouth at bedtime. 30 tablet 0   No current facility-administered medications on file prior to visit.     Past Surgical History:  Procedure Laterality Date  . ESOPHAGOGASTRODUODENOSCOPY    . WISDOM TOOTH EXTRACTION      No Known Allergies  Social History   Socioeconomic History  . Marital status: Divorced    Spouse name: Not on file  . Number of children: Not on file  . Years of education: Not on file  . Highest education level: Not on file  Social Needs  . Physicist, medicalinancial resource  strain: Not on file  . Food insecurity - worry: Not on file  . Food insecurity - inability: Not on file  . Transportation needs - medical: Not on file  . Transportation needs - non-medical: Not on file  Occupational History  . Not on file  Tobacco Use  . Smoking status: Current Some Day Smoker    Types: Cigarettes  . Smokeless tobacco: Never Used  Substance and Sexual Activity  . Alcohol use: Yes    Comment: rarely  . Drug use: No    Comment: heroin (past)  . Sexual activity: Yes    Birth control/protection: None, IUD  Other Topics Concern  . Not on file  Social  History Narrative  . Not on file    Family History  Problem Relation Age of Onset  . Other Neg Hx     BP 116/80   Pulse 94   Ht 5\' 8"  (1.727 m)   Wt 160 lb (72.6 kg)   BMI 24.33 kg/m   Review of Systems: See HPI above.     Objective:  Physical Exam:  Gen: NAD, comfortable in exam room.  Left knee: Mild effusion.  No other deformity. No TTP. ROM 0 - 120 degrees, 5/5 strength. Negative ant/post drawers. Negative valgus/varus testing. Negative lachmanns. Negative mcmurrays, apleys, patellar apprehension. NV intact distally.  Assessment & Plan:  1. Left knee injury - patellar instability with recurrent dislocation/subluxations.  She was approved for cone coverage but does not have letter yet - will bring this by when she does and go ahead with MRI and physical therapy.  Icing, ibuprofen or aleve, tylenol.  Home exercises in meantime.  Shield's brace.    Addendum:  MRI reviewed and discussed with patient.  Surprisingly she has ACL and extensive medial meniscus tears.  MPFL and medial retinaculum appear intact.  Given her level of activity recommended we go ahead with referral to ortho to discuss ACL reconstruction, meniscus debridement.

## 2017-11-07 NOTE — Assessment & Plan Note (Signed)
patellar instability with recurrent dislocation/subluxations.  She was approved for cone coverage but does not have letter yet - will bring this by when she does and go ahead with MRI and physical therapy.  Icing, ibuprofen or aleve, tylenol.  Home exercises in meantime.  Shield's brace.

## 2017-11-09 ENCOUNTER — Telehealth: Payer: Self-pay | Admitting: Family Medicine

## 2017-11-09 NOTE — Telephone Encounter (Signed)
Patient called and said she has been approved for the [employer]. Would like to start physical therapy as soon as possible

## 2017-11-09 NOTE — Telephone Encounter (Signed)
Order sent to physical therapy downstairs. They will contact her to set up an appointment.

## 2017-11-09 NOTE — Addendum Note (Signed)
Addended by: Kathi SimpersWISE, Denzil Bristol F on: 11/09/2017 03:40 PM   Modules accepted: Orders

## 2017-11-20 ENCOUNTER — Encounter: Payer: Self-pay | Admitting: Physical Therapy

## 2017-11-20 ENCOUNTER — Other Ambulatory Visit: Payer: Self-pay

## 2017-11-20 ENCOUNTER — Ambulatory Visit: Payer: Medicaid Other | Attending: Family Medicine | Admitting: Physical Therapy

## 2017-11-20 DIAGNOSIS — R29898 Other symptoms and signs involving the musculoskeletal system: Secondary | ICD-10-CM | POA: Insufficient documentation

## 2017-11-20 DIAGNOSIS — M25562 Pain in left knee: Secondary | ICD-10-CM | POA: Insufficient documentation

## 2017-11-20 NOTE — Patient Instructions (Addendum)
Bridge   Lie back, legs bent. Inhale, pressing hips up. Keeping ribs in, lengthen lower back. Exhale, rolling down along spine from top. Repeat __15__ times. Do __2__ sessions per day. **place ball/towel between knees and squeeze, once bottom is lifted kick out R LE**  Straight Leg Raise: With External Leg Rotation   Lie on back with right leg straight, opposite leg bent. Rotate straight leg out and lift __8__ inches. Repeat _15___ times per set. Do __2__ sets per session.   KNEE: Quadriceps - Prone   Place strap around ankle. Bring ankle toward buttocks. Press hip into surface. Hold __30_ seconds. _3__ reps per set **place towel roll under knee to help target hip flexors as well**  Wall Squat   Feet shoulder width apart, lean against wall. Heels on floor, knees parallel, bend hips and knees to almost 90. Hold __5-10_ seconds. Lower slowly, explode on return. Repeat _15___ times. Do __2__ sessions per day. **squeeze ball between knees**  Long Arc Quad   Straighten operated leg and try to hold it __5__ seconds.  Repeat __15__ times. Do __2__ sessions a day. **squeeze ball between knees**  Knee Extension: Step-Down Forward / Sideways / Backward (Eccentric)   Stand, holding support, affected foot on step. Slowly bend affected knee for 3-5 seconds and bring other heel forward to floor. Quickly straighten affected leg. Repeat, placing foot flat to side. Repeat, touching toe behind. _15__ reps per set, _2__ sets per day .

## 2017-11-20 NOTE — Therapy (Signed)
[facility] [place] [address]  [address] [place], [place], [address] Phone: [phone]   Fax:  [phone]  Physical Therapy Evaluation  Patient Details  Name: Debbie Griffin Shepheard MRN: [id number] Date of Birth: November 08, 1981 Referring Provider: Dr. Norton BlizzardShane Hudnall   Encounter Date: 11/20/2017  PT End of Session - 11/20/17 1247    Visit Number  1    Number of Visits  12    Date for PT Re-Evaluation  01/01/18    Authorization Type  [employer] Assistance    PT Start Time  1104    PT Stop Time  1148    PT Time Calculation (min)  44 min    Activity Tolerance  Patient tolerated treatment well    Behavior During Therapy  [facility] for tasks assessed/performed       Past Medical History:  Diagnosis Date  . Dislocation of the knee cap    left 2018  . Miscarriage   . Multiple gastric ulcers   . Ovarian cyst     Past Surgical History:  Procedure Laterality Date  . ESOPHAGOGASTRODUODENOSCOPY    . WISDOM TOOTH EXTRACTION      There were no vitals filed for this visit.   Subjective Assessment - 11/20/17 1104    Subjective  Patient reports hse was playing soccer - subluxed knee cap - reports has since dislocated 2x since initial injury. Has tried to return to soccer. Currently wearing brace all the time. Unable to jump/run since ~ Thanksgiving. Does currently exercise.    Pertinent History  "pinched nerve in low back" per patient    Diagnostic tests  [facility] u/s, Xray - all negative    Patient Stated Goals  return to soccer and snowboarding    Currently in Pain?  No/denies    Pain Score  0-No pain    Pain Location  Knee    Pain Orientation  Left    Pain Descriptors / Indicators  Sore    Pain Relieving Factors  ice         [facility] PT Assessment - 11/20/17 1108      Assessment   Medical Diagnosis  L knee injury    Referring Provider  Dr. Norton BlizzardShane Hudnall    Next MD Visit  prn    Prior Therapy  no      Precautions   Precautions  None      Restrictions   Weight Bearing Restrictions  No      Balance Screen   Has the patient fallen in the past 6 months  Yes    How many times?  1    Has the patient had a decrease in activity level because of a fear of falling?   No    Is the patient reluctant to leave their home because of a fear of falling?   No      Home Environment   Living Environment  Private residence    Type of Home  House    Home Layout  Two level    Alternate Level Stairs-Number of Steps  14    Additional Comments  some pain with stair descent      Prior Function   Level of Independence  Independent    Vocation  Part time employment    Vocation Requirements  remodeling    Leisure  soccer, snowboarding      Cognition   Overall Cognitive Status  Within Functional Limits for tasks assessed  Observation/Other Assessments   Focus on Therapeutic Outcomes (FOTO)   Knee: 62 (38% limited, predicted 29% limited)      Sensation   Light Touch  Appears Intact      Coordination   Gross Motor Movements are Fluid and Coordinated  Yes      Functional Tests   Functional tests  Squat;Lunges;Hopping;Single leg stance      Squat   Comments  some varus/valgus noted      Lunges   Comments  no issue      Hopping   Comments  reduced acceptance of weight with landing on B LE      Single Leg Stance   Comments  no issue; >10 seconds      Posture/Postural Control   Posture/Postural Control  No significant limitations      ROM / Strength   AROM / PROM / Strength  AROM;Strength      AROM   Overall AROM   Within functional limits for tasks performed      Strength   Overall Strength Comments  B LE grossly 4/5 to 4+/5      Flexibility   Soft Tissue Assessment /Muscle Length  yes    Hamstrings  B - little to no tightness    Quadriceps  B - moderate tightness, as well as at hip flexors      Palpation   Patella mobility  good mobility all planes    Palpation comment  diffusely non-tender      Special Tests     Special Tests  Knee Special Tests;Laxity/Instability Tests    Laxity/Instability   Anterior drawer test;Posterior drawer test    Knee Special tests   Patellofemoral Grind Test (Clarke's Sign)      Anterior drawer test   Findings  Negative    Side  Left      Posterior drawer test   Findings  Negative    Side   Left      Patellofemoral Grind test (Clark's Sign)   Findings  Postive    Side   Left             Objective measurements completed on examination: See above findings.      [facility] Adult PT Treatment/Exercise - 11/20/17 1108      Exercises   Exercises  Knee/Hip      Knee/Hip Exercises: Stretches   Quad Stretch  Left;3 reps;30 seconds    Quad Stretch Limitations  prone with strap; knee elevated with towel      Knee/Hip Exercises: Standing   Step Down  Left;10 reps;Hand Hold: 1;Step Height: 8"    Step Down Limitations  some crepitus heard    Wall Squat  10 reps;5 seconds    Wall Squat Limitations  with ball squeeze      Knee/Hip Exercises: Seated   Long Arc Quad  Strengthening;Left;10 reps    Long Arc Quad Limitations  with ball squeeze      Knee/Hip Exercises: Supine   Bridges  10 reps    Bridges Limitations  with ball squeeze and L knee extension at top of bridge    Straight Leg Raise with External Rotation  Left;10 reps      Manual Therapy   Manual Therapy  Taping    Kinesiotex  Facilitate Muscle      Kinesiotix   Facilitate Muscle   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  PT Education - 11/20/17 1134    Education provided  Yes    Education Details  exam findings, POC, HEP    Person(s) Educated  Patient    Methods  Explanation;Demonstration;Handout    Comprehension  Verbalized understanding;Returned demonstration          PT Long Term Goals - 11/20/17 1258      PT LONG TERM GOAL #1   Title  patient to be independent with advanced HEP    Status  New    Target Date  01/01/18      PT LONG TERM GOAL  #2   Title  Patient to demonstrate eccentric step down x 10 with good control and no LOB    Status  New    Target Date  01/01/18      PT LONG TERM GOAL #3   Title  patient to demosntrate good running/jumping/landing mechanics to reduce risk for re-injury    Status  New    Target Date  01/01/18      PT LONG TERM GOAL #4   Title  patient to report ability to return to sport without feelings of instability or pain    Status  New    Target Date  01/01/18             Plan - 11/20/17 1248    Clinical Impression Statement  Toni AmendCourtney is a 36 y/o female presenting to OPPT today with primary complaints of L knee pain/instability s/p L patellar subluxation ~4 months prior during a soccer game. Patient today with good squat and lunche mechanics, however some noted varug/valgus at B knees likely demonstrating some reduced strength. All ligamentous testing demonstrating in tact structures, however some laxity noted. Pain reproduced with Grind test consistent with patellofemoral pain. HEP education today including general quad and VMO strengthening with good tolerance. Taping also applied for hopeful pain releif. Will benefit from PT to address pain, strength and return to sport.    Clinical Presentation  Stable    Clinical Decision Making  Low    Rehab Potential  Good    PT Frequency  2x / week    PT Duration  6 weeks    PT Treatment/Interventions  ADLs/Self Care Home Management;Cryotherapy;Electrical Stimulation;Iontophoresis 4mg /ml Dexamethasone;Moist Heat;Therapeutic exercise;Therapeutic activities;Functional mobility training;Stair training;Gait training;Balance training;Ultrasound;Neuromuscular re-education;Patient/family education;Manual techniques;Vasopneumatic Device;Taping;Dry needling;Passive range of motion    Consulted and Agree with Plan of Care  Patient       Patient will benefit from skilled therapeutic intervention in order to improve the following deficits and impairments:   Decreased activity tolerance, Decreased mobility, Decreased strength, Pain, Increased edema  Visit Diagnosis: Acute pain of left knee  Other symptoms and signs involving the musculoskeletal system     Problem List Patient Active Problem List   Diagnosis Date Noted  . Left knee injury, subsequent encounter 08/04/2017  . Opiate abuse, episodic (HCC) 10/31/2016  . 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

## 2017-11-23 ENCOUNTER — Encounter: Payer: Self-pay | Admitting: Physical Therapy

## 2017-11-23 ENCOUNTER — Ambulatory Visit: Payer: Medicaid Other | Admitting: Physical Therapy

## 2017-11-23 DIAGNOSIS — R29898 Other symptoms and signs involving the musculoskeletal system: Secondary | ICD-10-CM

## 2017-11-23 DIAGNOSIS — M25562 Pain in left knee: Secondary | ICD-10-CM

## 2017-11-23 NOTE — Therapy (Signed)
[facility] [place] [address]  [address] [place], [place], [address] Phone: [phone]   Fax:  [phone]  Physical Therapy Treatment  Patient Details  Name: Debbie Griffin MRN: [id number] Date of Birth: [birth year] Referring Provider: Dr. Norton BlizzardShane Hudnall   Encounter Date: 11/23/2017  PT End of Session - 11/23/17 1651    Visit Number  2    Number of Visits  12    Date for PT Re-Evaluation  01/01/18    Authorization Type  [facility]    PT Start Time  1655    PT Stop Time  1736    PT Time Calculation (min)  41 min    Activity Tolerance  Patient tolerated treatment well    Behavior During Therapy  [facility] for tasks assessed/performed       Past Medical History:  Diagnosis Date  . Dislocation of the knee cap    left 2018  . Miscarriage   . Multiple gastric ulcers   . Ovarian cyst     Past Surgical History:  Procedure Laterality Date  . ESOPHAGOGASTRODUODENOSCOPY    . WISDOM TOOTH EXTRACTION      There were no vitals filed for this visit.  Subjective Assessment - 11/23/17 1652    Subjective  feeling well today - was doing HEP - may have done too much    Pertinent History  "pinched nerve in low back" per patient    Diagnostic tests  [facility] u/s, Xray - all negative    Patient Stated Goals  return to soccer and snowboarding    Currently in Pain?  Yes    Pain Score  2     Pain Location  Knee    Pain Orientation  Left    Pain Descriptors / Indicators  Tightness                      [facility] Adult PT Treatment/Exercise - 11/23/17 1657      Knee/Hip Exercises: Aerobic   Recumbent Bike  L2 x 6 min      Knee/Hip Exercises: Machines for Strengthening   Cybex Knee Extension  35# B Con/L stationary hold/ B ecc x 15    Cybex Leg Press  35# B con/L ecc x 15; calf raises 35# x 15 B LE      Knee/Hip Exercises: Standing   Functional Squat  15 reps;3 seconds TRX    Wall Squat  15 reps;5 seconds    Wall Squat  Limitations  with orange pball against wall      Knee/Hip Exercises: Supine   Other Supine Knee/Hip Exercises  HS bridge + HS curl x 15 reps on peanut ball      Manual Therapy   Manual Therapy  Taping    Kinesiotex  Facilitate Muscle      Kinesiotix   Facilitate Muscle   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                  PT Long Term Goals - 11/23/17 1651      PT LONG TERM GOAL #1   Title  patient to be independent with advanced HEP    Status  On-going      PT LONG TERM GOAL #2   Title  Patient to demonstrate eccentric step down x 10 with good control and no LOB    Status  On-going      PT LONG TERM  GOAL #3   Title  patient to demosntrate good running/jumping/landing mechanics to reduce risk for re-injury    Status  On-going      PT LONG TERM GOAL #4   Title  patient to report ability to return to sport without feelings of instability or pain    Status  On-going            Plan - 11/23/17 1651    Clinical Impression Statement  Toni AmendCourtney doing well today with all strength of both quads and HS muscle group with no issue. Hesitant to perform some exercises due to crepitus noise at B knee, however able to perform with litte to no pain. Taping applied to B knee as patient found good pain relief with this. Will continue to progress strengthening to tolerance.     PT Treatment/Interventions  ADLs/Self Care Home Management;Cryotherapy;Electrical Stimulation;Iontophoresis 4mg /ml Dexamethasone;Moist Heat;Therapeutic exercise;Therapeutic activities;Functional mobility training;Stair training;Gait training;Balance training;Ultrasound;Neuromuscular re-education;Patient/family education;Manual techniques;Vasopneumatic Device;Taping;Dry needling;Passive range of motion    Consulted and Agree with Plan of Care  Patient       Patient will benefit from skilled therapeutic intervention in order to improve the following deficits and impairments:  Decreased  activity tolerance, Decreased mobility, Decreased strength, Pain, Increased edema  Visit Diagnosis: Acute pain of left knee  Other symptoms and signs involving the musculoskeletal system     Problem List Patient Active Problem List   Diagnosis Date Noted  . Left knee injury, subsequent encounter 08/04/2017  . Opiate abuse, episodic (HCC) 10/31/2016  . 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

## 2017-11-27 ENCOUNTER — Encounter: Payer: Self-pay | Admitting: Physical Therapy

## 2017-11-27 ENCOUNTER — Ambulatory Visit: Payer: Medicaid Other | Admitting: Physical Therapy

## 2017-11-27 DIAGNOSIS — M25562 Pain in left knee: Secondary | ICD-10-CM

## 2017-11-27 DIAGNOSIS — R29898 Other symptoms and signs involving the musculoskeletal system: Secondary | ICD-10-CM

## 2017-11-27 NOTE — Patient Instructions (Signed)

## 2017-11-27 NOTE — Therapy (Signed)
[facility] [place] [address]  [address] [place], [place], [address] Phone: [phone]   Fax:  [phone]  Physical Therapy Treatment  Patient Details  Name: Debbie Griffin MRN: [id number] Date of Birth: 02-27-1981 Referring Provider: Dr. Norton BlizzardShane Hudnall   Encounter Date: 11/27/2017  PT End of Session - 11/27/17 1511    Visit Number  3    Number of Visits  12    Date for PT Re-Evaluation  01/01/18    Authorization Type  [facility]    PT Start Time  1509    PT Stop Time  1549    PT Time Calculation (min)  40 min    Activity Tolerance  Patient tolerated treatment well    Behavior During Therapy  [facility] for tasks assessed/performed       Past Medical History:  Diagnosis Date  . Dislocation of the knee cap    left 2018  . Miscarriage   . Multiple gastric ulcers   . Ovarian cyst     Past Surgical History:  Procedure Laterality Date  . ESOPHAGOGASTRODUODENOSCOPY    . WISDOM TOOTH EXTRACTION      There were no vitals filed for this visit.  Subjective Assessment - 11/27/17 1510    Subjective  doing well today - feels like her knee moved on Thursday    Diagnostic tests  [facility] u/s, Xray - all negative    Patient Stated Goals  return to soccer and snowboarding    Currently in Pain?  No/denies    Pain Score  0-No pain                      [facility] Adult PT Treatment/Exercise - 11/27/17 1512      Knee/Hip Exercises: Stretches   Other Knee/Hip Stretches  foam roll to quads - 3 way - 1 min each      Knee/Hip Exercises: Aerobic   Elliptical  L4 x 6 min      Knee/Hip Exercises: Machines for Strengthening   Cybex Knee Extension  35# B con/L ecc x 15    Cybex Knee Flexion  25# B con/L ecc x 15    Cybex Leg Press  45# B con/L ecc x 15      Knee/Hip Exercises: Standing   Functional Squat  15 reps BOSU (down)      Knee/Hip Exercises: Supine   Single Leg Bridge  Strengthening;Left;2 sets;15 reps L foot on foam  roller      Knee/Hip Exercises: Prone   Other Prone Exercises  prone hip extension - target foot floor to extension - 2# x 15      Modalities   Modalities  Iontophoresis      Iontophoresis   Type of Iontophoresis  Dexamethasone    Location  L anterior knee    Dose  1.590mL    Time  80 mA; 4-6 hours                  PT Long Term Goals - 11/23/17 1651      PT LONG TERM GOAL #1   Title  patient to be independent with advanced HEP    Status  On-going      PT LONG TERM GOAL #2   Title  Patient to demonstrate eccentric step down x 10 with good control and no LOB    Status  On-going      PT LONG TERM GOAL #3  Title  patient to demosntrate good running/jumping/landing mechanics to reduce risk for re-injury    Status  On-going      PT LONG TERM GOAL #4   Title  patient to report ability to return to sport without feelings of instability or pain    Status  On-going            Plan - 11/27/17 1512    Clinical Impression Statement  patient doing well - does report 1 instance of knee giving way this weekend, however, reports it was much less painful than in previous episodes. Doing well today with all strengthening progress. Did apply ionto patch to L anterior knee as patient with subjective reports of slight pain at end of session. Will continue to progress towards goals.     PT Treatment/Interventions  ADLs/Self Care Home Management;Cryotherapy;Electrical Stimulation;Iontophoresis 4mg /ml Dexamethasone;Moist Heat;Therapeutic exercise;Therapeutic activities;Functional mobility training;Stair training;Gait training;Balance training;Ultrasound;Neuromuscular re-education;Patient/family education;Manual techniques;Vasopneumatic Device;Taping;Dry needling;Passive range of motion    Consulted and Agree with Plan of Care  Patient       Patient will benefit from skilled therapeutic intervention in order to improve the following deficits and impairments:  Decreased activity  tolerance, Decreased mobility, Decreased strength, Pain, Increased edema  Visit Diagnosis: Acute pain of left knee  Other symptoms and signs involving the musculoskeletal system     Problem List Patient Active Problem List   Diagnosis Date Noted  . Left knee injury, subsequent encounter 08/04/2017  . Opiate abuse, episodic (HCC) 10/31/2016  . 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

## 2017-11-30 ENCOUNTER — Encounter: Payer: Self-pay | Admitting: Physical Therapy

## 2017-11-30 ENCOUNTER — Ambulatory Visit: Payer: Medicaid Other | Admitting: Physical Therapy

## 2017-11-30 DIAGNOSIS — R29898 Other symptoms and signs involving the musculoskeletal system: Secondary | ICD-10-CM

## 2017-11-30 DIAGNOSIS — M25562 Pain in left knee: Secondary | ICD-10-CM

## 2017-11-30 NOTE — Therapy (Signed)
[facility] [place] [address]  [address] [place], [place], [address] Phone: [phone]   Fax:  [phone]  Physical Therapy Treatment  Patient Details  Name: Debbie Griffin MRN: [id number] Date of Birth: 06-01-1981 Referring Provider: Dr. Norton BlizzardShane Hudnall   Encounter Date: 11/30/2017  PT End of Session - 11/30/17 1108    Visit Number  4    Number of Visits  12    Date for PT Re-Evaluation  01/01/18    Authorization Type  [facility]    PT Start Time  1107    PT Stop Time  1148    PT Time Calculation (min)  41 min    Activity Tolerance  Patient tolerated treatment well    Behavior During Therapy  [facility] for tasks assessed/performed       Past Medical History:  Diagnosis Date  . Dislocation of the knee cap    left 2018  . Miscarriage   . Multiple gastric ulcers   . Ovarian cyst     Past Surgical History:  Procedure Laterality Date  . ESOPHAGOGASTRODUODENOSCOPY    . WISDOM TOOTH EXTRACTION      There were no vitals filed for this visit.  Subjective Assessment - 11/30/17 1108    Subjective  may go play some pick up soccer today    Pertinent History  "pinched nerve in low back" per patient    Diagnostic tests  [facility] u/s, Xray - all negative    Patient Stated Goals  return to soccer and snowboarding    Currently in Pain?  No/denies    Pain Score  0-No pain                      [facility] Adult PT Treatment/Exercise - 11/30/17 1110      Knee/Hip Exercises: Stretches   Other Knee/Hip Stretches  foam roll - low back + piriformis B     Other Knee/Hip Stretches  foam roll to quads - 3 way - 1 min each      Knee/Hip Exercises: Aerobic   Elliptical  L4 x 6 min      Knee/Hip Exercises: Machines for Strengthening   Cybex Knee Flexion  25# B con/L ecc x 15      Knee/Hip Exercises: Plyometrics   Bilateral Jumping  -- fwd/bwd; side to side x 25 each    Other Plyometric Exercises  single leg drop (8") to lateral  hop x 15 each side - hesitant to accept weight onto L LE    Other Plyometric Exercises  bounding x 15 reps      Knee/Hip Exercises: Standing   Forward Lunges  15 reps alternating on BOSU (up)    Functional Squat  15 reps TRX    Other Standing Knee Exercises  B side stepping - 40 feet - green tband    Other Standing Knee Exercises  fwd/bwd monster walks - 2 x 30 feet - green tband      Manual Therapy   Manual Therapy  Taping    Kinesiotex  Facilitate Muscle      Kinesiotix   Facilitate Muscle   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                  PT Long Term Goals - 11/23/17 1651      PT LONG TERM GOAL #1   Title  patient to be independent with advanced HEP  Status  On-going      PT LONG TERM GOAL #2   Title  Patient to demonstrate eccentric step down x 10 with good control and no LOB    Status  On-going      PT LONG TERM GOAL #3   Title  patient to demosntrate good running/jumping/landing mechanics to reduce risk for re-injury    Status  On-going      PT LONG TERM GOAL #4   Title  patient to report ability to return to sport without feelings of instability or pain    Status  On-going            Plan - 11/30/17 1109    Clinical Impression Statement  Debbie Griffin doing well today - states she may go play pick up ball today with PT educating her on ned to play to tolerance to prevent pain with good understanding. Doing well with all strengthening and plyometric activities today, however very hesistant to push off and accept weight onto L LE. Will continue to progress towards goals.     PT Treatment/Interventions  ADLs/Self Care Home Management;Cryotherapy;Electrical Stimulation;Iontophoresis 4mg /ml Dexamethasone;Moist Heat;Therapeutic exercise;Therapeutic activities;Functional mobility training;Stair training;Gait training;Balance training;Ultrasound;Neuromuscular re-education;Patient/family education;Manual techniques;Vasopneumatic  Device;Taping;Dry needling;Passive range of motion    Consulted and Agree with Plan of Care  Patient       Patient will benefit from skilled therapeutic intervention in order to improve the following deficits and impairments:  Decreased activity tolerance, Decreased mobility, Decreased strength, Pain, Increased edema  Visit Diagnosis: Acute pain of left knee  Other symptoms and signs involving the musculoskeletal system     Problem List Patient Active Problem List   Diagnosis Date Noted  . Left knee injury, subsequent encounter 08/04/2017  . Opiate abuse, episodic (HCC) 10/31/2016  . 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  [address] [place], [place], [address] Phone: [phone]   Fax:  [phone]  Name: Debbie Griffin MRN: [id number] Date of Birth: 1980/12/21

## 2017-12-04 ENCOUNTER — Ambulatory Visit: Payer: Medicaid Other

## 2017-12-07 ENCOUNTER — Ambulatory Visit: Payer: Self-pay | Attending: Family Medicine

## 2017-12-07 ENCOUNTER — Telehealth: Payer: Self-pay | Admitting: Family Medicine

## 2017-12-07 DIAGNOSIS — M25562 Pain in left knee: Secondary | ICD-10-CM | POA: Insufficient documentation

## 2017-12-07 DIAGNOSIS — R29898 Other symptoms and signs involving the musculoskeletal system: Secondary | ICD-10-CM | POA: Insufficient documentation

## 2017-12-07 NOTE — Therapy (Signed)
[facility] [address]  [address] [place], [place], [address] Phone: [phone]   Fax:  [phone]  Physical Therapy Treatment  Patient Details  Name: Debbie Griffin MRN: [id number] Date of Birth: 1981/07/23 Referring Provider: Dr. Norton Blizzard   Encounter Date: 12/07/2017  PT End of Session - 12/07/17 1105    Visit Number  5    Number of Visits  12    Date for PT Re-Evaluation  01/01/18    Authorization Type  [facility]    PT Start Time  1100    PT Stop Time  1148    PT Time Calculation (min)  48 min    Activity Tolerance  Patient tolerated treatment well    Behavior During Therapy  [facility] for tasks assessed/performed       Past Medical History:  Diagnosis Date  . Dislocation of the knee cap    left 2018  . Miscarriage   . Multiple gastric ulcers   . Ovarian cyst     Past Surgical History:  Procedure Laterality Date  . ESOPHAGOGASTRODUODENOSCOPY    . WISDOM TOOTH EXTRACTION      There were no vitals filed for this visit.  Subjective Assessment - 12/07/17 1103    Subjective  Pt. reporting two incidents of L knee cap sublux on Monday while walking.  Played "pick up socker on Christmas Eve".  Pt. reporting she will be going to [place] and will return on Tuesday (1.8).      Pertinent History  "pinched nerve in low back" per patient    Diagnostic tests  [facility] u/s, Xray - all negative    Patient Stated Goals  return to soccer and snowboarding    Currently in Pain?  No/denies    Pain Score  0-No pain L anterior knee pain up to 6/10 pain with jogging yesterday    Multiple Pain Sites  No                      [facility] Adult PT Treatment/Exercise - 12/07/17 1120      Knee/Hip Exercises: Aerobic   Elliptical  L4 x 6 min      Knee/Hip Exercises: Machines for Strengthening   Cybex Knee Flexion  25# B con/L ecc x 20      Knee/Hip Exercises: Standing   Step Down  Left;10 reps;Step Height: 4"    Step  Down Limitations  apprehension    Wall Squat  5 seconds;10 reps    Wall Squat Limitations  With alternating LE kickout LAQ    SLS  L SLS pallof press with red TB x 15 reps       Knee/Hip Exercises: Supine   Single Leg Bridge  Right;Left;10 reps With alternating LE kickout into SLR x 2     Straight Leg Raise with External Rotation  Left;10 reps    Straight Leg Raise with External Rotation Limitations  2    Other Supine Knee/Hip Exercises  HS bridge + HS curl x 20 reps on peanut ball      Knee/Hip Exercises: Sidelying   Hip ADduction  Right;Left;Strengthening;10 reps    Hip ADduction Limitations  2#       Manual Therapy   Manual Therapy  Taping      Kinesiotix   Facilitate Muscle   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  PT Long Term Goals - 11/23/17 1651      PT LONG TERM GOAL #1   Title  patient to be independent with advanced HEP    Status  On-going      PT LONG TERM GOAL #2   Title  Patient to demonstrate eccentric step down x 10 with good control and no LOB    Status  On-going      PT LONG TERM GOAL #3   Title  patient to demosntrate good running/jumping/landing mechanics to reduce risk for re-injury    Status  On-going      PT LONG TERM GOAL #4   Title  patient to report ability to return to sport without feelings of instability or pain    Status  On-going            Plan - 12/07/17 1106    Clinical Impression Statement  Seen to start treatment with increased L knee swelling and report of "subluxed knee cap" twice on Monday.  Played "pick-up soccer" for a few hours on Christmas Eve and reports feeling fine with this.  Pt. reporting, "my friend is telling me I need an MRI".  Pt. verbalizing intentions to contact MD regarding ordering MRI.  Zaynab with good tolerance for strengthening activities today however verbalizing apprehension with eccentric step-downs and with some hesitance with resisted SLR.  Will continue  to progress toward goals in coming visits.      PT Treatment/Interventions  ADLs/Self Care Home Management;Cryotherapy;Electrical Stimulation;Iontophoresis 4mg /ml Dexamethasone;Moist Heat;Therapeutic exercise;Therapeutic activities;Functional mobility training;Stair training;Gait training;Balance training;Ultrasound;Neuromuscular re-education;Patient/family education;Manual techniques;Vasopneumatic Device;Taping;Dry needling;Passive range of motion    Consulted and Agree with Plan of Care  Patient       Patient will benefit from skilled therapeutic intervention in order to improve the following deficits and impairments:  Decreased activity tolerance, Decreased mobility, Decreased strength, Pain, Increased edema  Visit Diagnosis: Acute pain of left knee  Other symptoms and signs involving the musculoskeletal system     Problem List Patient Active Problem List   Diagnosis Date Noted  . Left knee injury, subsequent encounter 08/04/2017  . Opiate abuse, episodic (HCC) 10/31/2016  . 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  [address] [place], [place], [address] Phone: [phone]   Fax:  [phone]  Name: Debbie Griffin MRN: [id number] Date of Birth: 1981-02-16

## 2017-12-07 NOTE — Telephone Encounter (Signed)
Patient wants to know if an MRI can be set up for her left knee. States it continues to dislocate

## 2017-12-07 NOTE — Telephone Encounter (Signed)
Ok to go ahead with MRI as she has [facility] Coverage now.  Thanks!

## 2017-12-07 NOTE — Addendum Note (Signed)
Addended by: Kathi SimpersWISE, Niki Payment F on: 12/07/2017 04:58 PM   Modules accepted: Orders

## 2017-12-08 NOTE — Telephone Encounter (Signed)
Order placed for MRI

## 2017-12-13 ENCOUNTER — Ambulatory Visit: Payer: Self-pay

## 2017-12-13 DIAGNOSIS — R29898 Other symptoms and signs involving the musculoskeletal system: Secondary | ICD-10-CM

## 2017-12-13 DIAGNOSIS — M25562 Pain in left knee: Secondary | ICD-10-CM

## 2017-12-13 NOTE — Therapy (Signed)
[facility] [address]  [address] [place], [place], [address] Phone: [phone]   Fax:  [phone]  Physical Therapy Treatment  Patient Details  Name: Debbie Griffin MRN: [id number] Date of Birth: [birth year] Referring Provider: Dr. Norton BlizzardShane Hudnall   Encounter Date: 12/13/2017  PT End of Session - 12/13/17 1102    Visit Number  6    Number of Visits  12    Date for PT Re-Evaluation  01/01/18    Authorization Type  [facility]    PT Start Time  1101    PT Stop Time  1151    PT Time Calculation (min)  50 min    Activity Tolerance  Patient tolerated treatment well    Behavior During Therapy  [facility] for tasks assessed/performed       Past Medical History:  Diagnosis Date  . Dislocation of the knee cap    left 2018  . Miscarriage   . Multiple gastric ulcers   . Ovarian cyst     Past Surgical History:  Procedure Laterality Date  . ESOPHAGOGASTRODUODENOSCOPY    . WISDOM TOOTH EXTRACTION      There were no vitals filed for this visit.  Subjective Assessment - 12/13/17 1104    Subjective  Reports no recent knee sublux. Called MD and scheduled MRI for Saturday (1.12.19).      Diagnostic tests  MSK u/s, Xray - all negative    Patient Stated Goals  return to soccer and snowboarding    Currently in Pain?  No/denies    Pain Score  0-No pain    Multiple Pain Sites  No                      [facility] Adult PT Treatment/Exercise - 12/13/17 1106      Knee/Hip Exercises: Stretches   Other Knee/Hip Stretches  foam roll to quads - 3 way - 1 min each      Knee/Hip Exercises: Aerobic   Elliptical  L4 x 6 min      Knee/Hip Exercises: Standing   Terminal Knee Extension  Left;15 reps    Theraband Level (Terminal Knee Extension)  Level 4 (Blue)    Step Down  Left;2 sets;10 reps;Hand Hold: 1;Step Height: 4";Step Height: 6"    Step Down Limitations  second set with 6" step; pain free  less apprehension today      Functional Squat  15 reps    Functional Squat Limitations  + heel raise     SLS  L SLS pallof press with red TB x 15 reps each direction     Other Standing Knee Exercises  B side stepping 2 x 30 ft  - green tband      Knee/Hip Exercises: Seated   Long Arc Quad  Strengthening;Left;10 reps    Long Arc Quad Limitations  2#; with ball squeeze 3" hold     Stool Scoot - Round Trips  2 x 50 ft       Knee/Hip Exercises: Supine   Bridges  15 reps    Bridges Limitations  Bridge with alternating straight leg kickout at top x 1 each leg     Straight Leg Raise with External Rotation  Left;15 reps    Straight Leg Raise with External Rotation Limitations  2      Knee/Hip Exercises: Sidelying   Hip ADduction  Right;Left;Strengthening;15 reps    Hip ADduction Limitations  2#  Knee/Hip Exercises: Prone   Other Prone Exercises  prone plank elbows and toes 5 x 10"              PT Education - 12/13/17 1209    Education provided  Yes    Education Details  standing TKE with blue looped TB issued to pt., prone quad foam rolling, side stepping with green looped TB at ankles with green TB issued to pt.      Person(s) Educated  Patient    Methods  Explanation;Demonstration;Verbal cues;Handout    Comprehension  Verbalized understanding;Returned demonstration;Verbal cues required;Need further instruction          PT Long Term Goals - 11/23/17 1651      PT LONG TERM GOAL #1   Title  patient to be independent with advanced HEP    Status  On-going      PT LONG TERM GOAL #2   Title  Patient to demonstrate eccentric step down x 10 with good control and no LOB    Status  On-going      PT LONG TERM GOAL #3   Title  patient to demosntrate good running/jumping/landing mechanics to reduce risk for re-injury    Status  On-going      PT LONG TERM GOAL #4   Title  patient to report ability to return to sport without feelings of instability or pain    Status  On-going            Plan -  12/13/17 1103    Clinical Impression Statement  Pt. reporting she had some L knee soreness while on vacation to [facility] however has not had sublux since 12.24.18.  Scheduled MRI for Saturday (1.12.19).  Has been having some L knee pain with HEP step-down thus encouraged to use lower step at home and able to demo 6" step-down without knee pain today in treatment.  Tolerated all LE strengthening and quad/VMO strengthening activities well in treatment today.  HEP updated today to include additional LE strengthening activities and foam rolling for lateral quad as pt. has foam roller at home.  Will continue to progress toward goals in coming visits.      PT Treatment/Interventions  ADLs/Self Care Home Management;Cryotherapy;Electrical Stimulation;Iontophoresis 4mg /ml Dexamethasone;Moist Heat;Therapeutic exercise;Therapeutic activities;Functional mobility training;Stair training;Gait training;Balance training;Ultrasound;Neuromuscular re-education;Patient/family education;Manual techniques;Vasopneumatic Device;Taping;Dry needling;Passive range of motion    Consulted and Agree with Plan of Care  Patient       Patient will benefit from skilled therapeutic intervention in order to improve the following deficits and impairments:  Decreased activity tolerance, Decreased mobility, Decreased strength, Pain, Increased edema  Visit Diagnosis: Acute pain of left knee  Other symptoms and signs involving the musculoskeletal system     Problem List Patient Active Problem List   Diagnosis Date Noted  . Left knee injury, subsequent encounter 08/04/2017  . Opiate abuse, episodic (HCC) 10/31/2016  . 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  [address] [place], [place], [address] Phone: [phone]   Fax:  [phone]  Name: Debbie Griffin MRN:  [id number] Date of Birth: 29-Dec-1980

## 2017-12-15 ENCOUNTER — Ambulatory Visit: Payer: Self-pay

## 2017-12-15 DIAGNOSIS — R29898 Other symptoms and signs involving the musculoskeletal system: Secondary | ICD-10-CM

## 2017-12-15 DIAGNOSIS — M25562 Pain in left knee: Secondary | ICD-10-CM

## 2017-12-15 NOTE — Therapy (Addendum)
[facility] [place] [facility] [address]  [place], [place], [address] Phone: [phone]   Fax:  [phone]  Physical Therapy Treatment  Patient Details  Name: Debbie Griffin MRN: [id number] Date of Birth: 1981/06/04 Referring Provider: Dr. Karlton Lemon   Encounter Date: 12/15/2017  PT End of Session - 12/15/17 1102    Visit Number  7    Number of Visits  12    Date for PT Re-Evaluation  01/01/18    Authorization Type  [facility]    PT Start Time  1100    PT Stop Time  1142    PT Time Calculation (min)  42 min    Activity Tolerance  Patient tolerated treatment well    Behavior During Therapy  [facility] for tasks assessed/performed       Past Medical History:  Diagnosis Date  . Dislocation of the knee cap    left 2018  . Miscarriage   . Multiple gastric ulcers   . Ovarian cyst     Past Surgical History:  Procedure Laterality Date  . ESOPHAGOGASTRODUODENOSCOPY    . WISDOM TOOTH EXTRACTION      There were no vitals filed for this visit.  Subjective Assessment - 12/15/17 1102    Subjective  MRI scheduled for tomorrow 1.12.19.      Pertinent History  "pinched nerve in low back" per patient    Diagnostic tests  MSK u/s, Xray - all negative    Patient Stated Goals  return to soccer and snowboarding    Currently in Pain?  No/denies    Pain Score  0-No pain    Multiple Pain Sites  No                      [facility] Adult PT Treatment/Exercise - 12/15/17 1108      Knee/Hip Exercises: Stretches   Quad Stretch  Left;1 rep;60 seconds    Other Knee/Hip Stretches  Foam roll to L lateral quad x 1 min; good relief reported following this       Knee/Hip Exercises: Aerobic   Elliptical  L4 x 6 min      Knee/Hip Exercises: Machines for Strengthening   Cybex Leg Press  45# B LE's x 20 reps; L only 35# x 15 reps       Knee/Hip Exercises: Plyometrics   Bilateral Jumping  20 reps    Bilateral Jumping Limitations   forward/back, side<>side; small hops       Knee/Hip Exercises: Standing   Forward Lunges  Right;Left;10 reps    Forward Lunges Limitations  with cross-over toe-touch to front foot     Step Down  Left;10 reps;Hand Hold: 1;Step Height: 8";1 set    Step Down Limitations  visible quad instability however tolerated well     Walking with Sports Cord  All directions x 100 ft       Knee/Hip Exercises: Seated   Stool Scoot - Round Trips  2 x 75 ft  fatuge reported following this       Knee/Hip Exercises: Prone   Other Prone Exercises  prone plank elbows and toes 5 x 15"                   PT Long Term Goals - 11/23/17 1651      PT LONG TERM GOAL #1   Title  patient to be independent with advanced HEP    Status  On-going  PT LONG TERM GOAL #2   Title  Patient to demonstrate eccentric step down x 10 with good control and no LOB    Status  On-going      PT LONG TERM GOAL #3   Title  patient to demosntrate good running/jumping/landing mechanics to reduce risk for re-injury    Status  On-going      PT LONG TERM GOAL #4   Title  patient to report ability to return to sport without feelings of instability or pain    Status  On-going            Plan - 12/15/17 1102    Clinical Impression Statement  Debbie Griffin doing well today.  Had some mild abdominal soreness following last visit, which subsided.  Pt. tolerated advancement of eccentric step-down to 8" step today however with some visible quad instability with this.  Was pain free throughout therex today and able to return to mild plyometrics for first time since recent knee sublux without issue.  MRI scheduled for tomorrow (1.12.19).  Will f/u on results of MRI and continue progressing toward goals per pt. tolerance in coming visits.      PT Treatment/Interventions  ADLs/Self Care Home Management;Cryotherapy;Electrical Stimulation;Iontophoresis '4mg'$ /ml Dexamethasone;Moist Heat;Therapeutic exercise;Therapeutic  activities;Functional mobility training;Stair training;Gait training;Balance training;Ultrasound;Neuromuscular re-education;Patient/family education;Manual techniques;Vasopneumatic Device;Taping;Dry needling;Passive range of motion    Consulted and Agree with Plan of Care  Patient       Patient will benefit from skilled therapeutic intervention in order to improve the following deficits and impairments:  Decreased activity tolerance, Decreased mobility, Decreased strength, Pain, Increased edema  Visit Diagnosis: Acute pain of left knee  Other symptoms and signs involving the musculoskeletal system     Problem List Patient Active Problem List   Diagnosis Date Noted  . Left knee injury, subsequent encounter 08/04/2017  . Opiate abuse, episodic ([place]) 10/31/2016  . 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  Patient goals were not met. Patient is being discharged due to                                                     ?????MRI revealing ACL tear; surgical repair by Dr. Marlou Sa.    Lanney Gins, PT, DPT 02/13/18 9:32 AM   [facility] [address]  [place], [place], [address] Phone: [phone]   Fax:  [phone]  Name: Debbie Griffin MRN: [id number] Date of Birth: 04-17-1981

## 2017-12-16 ENCOUNTER — Ambulatory Visit (HOSPITAL_BASED_OUTPATIENT_CLINIC_OR_DEPARTMENT_OTHER)
Admission: RE | Admit: 2017-12-16 | Discharge: 2017-12-16 | Disposition: A | Discharge status: Home / Self Care | Payer: Medicaid Other | Source: Ambulatory Visit | Attending: Family Medicine | Admitting: Family Medicine

## 2017-12-16 DIAGNOSIS — S83232A Complex tear of medial meniscus, current injury, left knee, initial encounter: Secondary | ICD-10-CM | POA: Insufficient documentation

## 2017-12-16 DIAGNOSIS — S83422A Sprain of lateral collateral ligament of left knee, initial encounter: Secondary | ICD-10-CM | POA: Insufficient documentation

## 2017-12-16 DIAGNOSIS — M25462 Effusion, left knee: Secondary | ICD-10-CM | POA: Insufficient documentation

## 2017-12-16 DIAGNOSIS — M65862 Other synovitis and tenosynovitis, left lower leg: Secondary | ICD-10-CM | POA: Insufficient documentation

## 2017-12-16 DIAGNOSIS — S83105D Unspecified dislocation of left knee, subsequent encounter: Secondary | ICD-10-CM | POA: Insufficient documentation

## 2017-12-18 ENCOUNTER — Encounter: Payer: Self-pay | Admitting: Physical Therapy

## 2017-12-21 ENCOUNTER — Ambulatory Visit: Payer: Self-pay

## 2017-12-25 ENCOUNTER — Ambulatory Visit (INDEPENDENT_AMBULATORY_CARE_PROVIDER_SITE_OTHER): Payer: Self-pay | Admitting: Orthopaedic Surgery

## 2017-12-27 ENCOUNTER — Ambulatory Visit (INDEPENDENT_AMBULATORY_CARE_PROVIDER_SITE_OTHER): Payer: Self-pay | Admitting: Orthopedic Surgery

## 2017-12-27 ENCOUNTER — Encounter (INDEPENDENT_AMBULATORY_CARE_PROVIDER_SITE_OTHER): Payer: Self-pay | Admitting: Orthopedic Surgery

## 2017-12-27 DIAGNOSIS — S83512A Sprain of anterior cruciate ligament of left knee, initial encounter: Secondary | ICD-10-CM

## 2017-12-27 NOTE — Addendum Note (Signed)
Addended by: Kathi SimpersWISE, Thia Olesen F on: 12/27/2017 02:55 PM   Modules accepted: Orders

## 2017-12-30 ENCOUNTER — Encounter (INDEPENDENT_AMBULATORY_CARE_PROVIDER_SITE_OTHER): Payer: Self-pay | Admitting: Orthopedic Surgery

## 2017-12-30 NOTE — Progress Notes (Signed)
Office Visit Note   Patient: Debbie Griffin           Date of Birth: 09/16/1981           MRN: [id number] Visit Date: 12/27/2017 Requested by: No referring provider defined for this encounter. PCP: Patient, No Pcp Per  Subjective: Chief Complaint  Patient presents with  . Left Knee - Pain, Injury    HPI: Debbie Griffin is a patient with left knee pain.  She has had chronic knee problem for the last 10 years.  She play soccer on 4 teams.  She describes episodes of symptomatic instability on several occasions within the past few months.  She has had a course of physical therapy.  She is tried to play soccer throughout the period of her knee problems but she is having more difficulty.  She does have a brace for the left knee.  This is a patellar stabilizing brace.  MRI scan is reviewed.  She does have complex medial meniscal tear ACL tear and intact medial patellofemoral ligament.              ROS: All systems reviewed are negative as they relate to the chief complaint within the history of present illness.  Patient denies  fevers or chills.   Assessment & Plan: Visit Diagnoses:  1. Rupture of anterior cruciate ligament of left knee, initial encounter     Plan: Pression is left knee pain with ACL tear and medial meniscal tear.  Optimal plan at this time includes ACL reconstruction with medial meniscal repair versus debridement.  Reviewed the axial cuts of the MRI scan with court knee.  No real patterns consistent with patellar subluxation.  NPFL appears intact.  There is some stretching of the ligament It does have femoral and patellar attachments intact however.  There is no family history or personal history of DVT or pulmonary embolism.  Risk and benefits are discussed including but not limited to infection knee stiffness as well as potential need for more surgery.  All questions answered  Follow-Up Instructions: No Follow-up on file.   Orders:  No orders of the defined types were placed in  this encounter.  No orders of the defined types were placed in this encounter.     Procedures: No procedures performed   Clinical Data: No additional findings.  Objective: Vital Signs: There were no vitals taken for this visit.  Physical Exam:   Constitutional: Patient appears well-developed HEENT:  Head: Normocephalic Eyes:EOM are normal Neck: Normal range of motion Cardiovascular: Normal rate Pulmonary/chest: Effort normal Neurologic: Patient is alert Skin: Skin is warm Psychiatric: Patient has normal mood and affect    Ortho Exam: Orthopedic exam demonstrates full active and passive range of motion of the left knee.  She does have a little bit more lateral patellar motion on the left knee compared to the right.  No apprehension with patellar mobilization laterally on the left-hand side.  ACL is out but there is no posterior lateral rotatory instability.  Medial joint line tenderness is present.  PCL is intact  Specialty Comments:  No specialty comments available.  Imaging: No results found.   PMFS History: Patient Active Problem List   Diagnosis Date Noted  . Left knee injury, subsequent encounter 08/04/2017  . Opiate abuse, episodic (HCC) 10/31/2016  . 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 Dislocation of the knee cap    left 2018  .  Miscarriage   . Multiple gastric ulcers   . Ovarian cyst     Family History  Problem Relation Age of Onset  . Other Neg Hx     Past Surgical History:  Procedure Laterality Date  . ESOPHAGOGASTRODUODENOSCOPY    . WISDOM TOOTH EXTRACTION     Social History   Occupational History  . Not on file  Tobacco Use  . Smoking status: Current Some Day Smoker    Types: Cigarettes  . Smokeless tobacco: Never Used  Substance and Sexual Activity  . Alcohol use: Yes    Comment: rarely  . Drug use: No    Comment: heroin (past)  . Sexual  activity: Yes    Birth control/protection: None, IUD

## 2018-01-05 ENCOUNTER — Other Ambulatory Visit (INDEPENDENT_AMBULATORY_CARE_PROVIDER_SITE_OTHER): Payer: Self-pay | Admitting: Orthopedic Surgery

## 2018-01-05 DIAGNOSIS — S83512A Sprain of anterior cruciate ligament of left knee, initial encounter: Secondary | ICD-10-CM

## 2018-01-08 ENCOUNTER — Telehealth (INDEPENDENT_AMBULATORY_CARE_PROVIDER_SITE_OTHER): Payer: Self-pay | Admitting: Orthopedic Surgery

## 2018-01-08 NOTE — Telephone Encounter (Signed)
Ok for note 

## 2018-01-08 NOTE — Telephone Encounter (Signed)
Done and faxed to [phone] per patients request.

## 2018-01-08 NOTE — Telephone Encounter (Signed)
Patient called advised she is suppose to go to court Monday. Patient asked if Dr August Saucerean will write a note stating she is having surgery on Friday and will not be able to go to court. Patient advised the note is for her attorney. Patient asked if the note can be emailed to her. The number to contact patient is [phone]

## 2018-01-09 NOTE — Pre-Procedure Instructions (Signed)
Debbie Griffin  01/09/2018    Your procedure is scheduled on Friday, January 12, 2018 at 7:30 AM.   [facility] Admitting Office at 5:30 AM.   Call this number if you have problems the morning of surgery: [phone]   Questions prior to day of surgery, please call [phone] between 8 & 4 PM.   Remember:  Do not eat food or drink liquids after midnight Thursday, 01/11/18.  Do not smoke 24 hours prior to surgery.    Do not wear jewelry, make-up or nail polish.  Do not wear lotions, powders, perfumes or deodorant.  Do not shave 48 hours prior to surgery.   Do not bring valuables to the hospital.  [facility] not responsible for any belongings or valuables.  Contacts, dentures or bridgework may not be worn into surgery.  Leave your suitcase in the car.  After surgery it may be brought to your room.  For patients admitted to the hospital, discharge time will be determined by your treatment team.  Patients discharged the day of surgery will not be allowed to drive home.   [facility] - Preparing for Surgery  Before surgery, you can play an important role.  Because skin is not sterile, your skin needs to be as free of germs as possible.  You can reduce the number of germs on you skin by washing with CHG (chlorahexidine gluconate) soap before surgery.  CHG is an antiseptic cleaner which kills germs and bonds with the skin to continue killing germs even after washing.  Please DO NOT use if you have an allergy to CHG or antibacterial soaps.  If your skin becomes reddened/irritated stop using the CHG and inform your nurse when you arrive at Short Stay.  Do not shave (including legs and underarms) for at least 48 hours prior to the first CHG shower.  You may shave your face.  Please follow these instructions carefully:   1.  Shower with CHG Soap the night before surgery and the                    morning of Surgery.  2.  If you choose to wash your  hair, wash your hair first as usual with your       normal shampoo.  3.  After you shampoo, rinse your hair and body thoroughly to remove the shampoo.  4.  Use CHG as you would any other liquid soap.  You can apply chg directly       to the skin and wash gently with scrungie or a clean washcloth.  5.  Apply the CHG Soap to your body ONLY FROM THE NECK DOWN.        Do not use on open wounds or open sores.  Avoid contact with your eyes, ears, mouth and genitals (private parts).  Wash genitals (private parts) with your normal soap.  6.  Wash thoroughly, paying special attention to the area where your surgery        will be performed.  7.  Thoroughly rinse your body with warm water from the neck down.  8.  DO NOT shower/wash with your normal soap after using and rinsing off       the CHG Soap.  9.  Pat yourself dry with a clean towel.            10.  Wear clean pajamas.  11.  Place clean sheets on your bed the night of your first shower and do not        sleep with pets.  Day of Surgery  Shower as above. Do not apply any lotions/deodorants the morning of surgery.  Please wear clean clothes to the hospital.   Please read over the fact sheets that you were given.

## 2018-01-10 ENCOUNTER — Other Ambulatory Visit (HOSPITAL_COMMUNITY): Payer: Self-pay | Admitting: *Deleted

## 2018-01-10 ENCOUNTER — Encounter (HOSPITAL_COMMUNITY)
Admission: RE | Admit: 2018-01-10 | Discharge: 2018-01-10 | Disposition: A | Discharge status: Home / Self Care | Payer: Medicaid Other | Source: Ambulatory Visit | Attending: Orthopedic Surgery | Admitting: Orthopedic Surgery

## 2018-01-10 ENCOUNTER — Encounter (HOSPITAL_COMMUNITY): Payer: Self-pay

## 2018-01-10 ENCOUNTER — Telehealth (INDEPENDENT_AMBULATORY_CARE_PROVIDER_SITE_OTHER): Payer: Self-pay | Admitting: Orthopedic Surgery

## 2018-01-10 ENCOUNTER — Other Ambulatory Visit: Payer: Self-pay

## 2018-01-10 DIAGNOSIS — S83512A Sprain of anterior cruciate ligament of left knee, initial encounter: Secondary | ICD-10-CM | POA: Insufficient documentation

## 2018-01-10 DIAGNOSIS — Z01818 Encounter for other preprocedural examination: Secondary | ICD-10-CM | POA: Insufficient documentation

## 2018-01-10 DIAGNOSIS — X58XXXA Exposure to other specified factors, initial encounter: Secondary | ICD-10-CM | POA: Insufficient documentation

## 2018-01-10 HISTORY — DX: Anxiety disorder, unspecified: F41.9

## 2018-01-10 LAB — CBC
HEMATOCRIT: 39.7 % (ref 36.0–46.0)
HEMOGLOBIN: 13.9 g/dL (ref 12.0–15.0)
MCH: 31.2 pg (ref 26.0–34.0)
MCHC: 35 g/dL (ref 30.0–36.0)
MCV: 89.2 fL (ref 78.0–100.0)
Platelets: 231 10*3/uL (ref 150–400)
RBC: 4.45 MIL/uL (ref 3.87–5.11)
RDW: 12.2 % (ref 11.5–15.5)
WBC: 6.3 10*3/uL (ref 4.0–10.5)

## 2018-01-10 NOTE — Telephone Encounter (Signed)
IC advised could pick up at front desk tomorrow.

## 2018-01-10 NOTE — Telephone Encounter (Signed)
Y done

## 2018-01-10 NOTE — Telephone Encounter (Signed)
Patient called asked if Dr. August Saucerean can prescribe all the medication she will need prior to surgery on Friday so that she can pick up and have already at home. The number to contact patient is [phone]

## 2018-01-10 NOTE — Progress Notes (Signed)
Pt denies cardiac history or diabetes.  

## 2018-01-10 NOTE — Telephone Encounter (Signed)
Please advise thanks.

## 2018-01-11 NOTE — H&P (Signed)
Debbie Griffin is an 37 y.o. female.   Chief Complaint: Left knee instability HPI: Debbie Griffin is a patient who is very active and play soccer.  She injured her knee last year.  She has had several episodes of symptomatic instability in the left knee since that time.  MRI scan shows medial meniscal tear along with ACL tear.  She presents now for operative management after explanation of risks and benefits.  No family history or personal history of DVT or pulmonary embolism.  Past Medical History:  Diagnosis Date  . Anxiety   . Dislocation of the knee cap    left 2018  . Miscarriage   . Multiple gastric ulcers   . Ovarian cyst     Past Surgical History:  Procedure Laterality Date  . ESOPHAGOGASTRODUODENOSCOPY    . WISDOM TOOTH EXTRACTION      Family History  Problem Relation Age of Onset  . Thyroid disease Father   . Other Neg Hx    Social History:  reports that she has been smoking cigarettes.  she has never used smokeless tobacco. She reports that she drinks alcohol. She reports that she does not use drugs.  Allergies: No Known Allergies  No medications prior to admission.    Results for orders placed or performed during the hospital encounter of 01/10/18 (from the past 48 hour(s))  CBC     Status: None   Collection Time: 01/10/18  8:42 AM  Result Value Ref Range   WBC 6.3 4.0 - 10.5 K/uL   RBC 4.45 3.87 - 5.11 MIL/uL   Hemoglobin 13.9 12.0 - 15.0 g/dL   HCT 96.039.7 45.436.0 - 09.846.0 %   MCV 89.2 78.0 - 100.0 fL   MCH 31.2 26.0 - 34.0 pg   MCHC 35.0 30.0 - 36.0 g/dL   RDW 11.912.2 14.711.5 - 82.915.5 %   Platelets 231 150 - 400 K/uL    Comment: Performed at [facility] : [facility], [address] [place], [place] [address]   No results found.  Review of Systems  Musculoskeletal: Positive for joint pain.  All other systems reviewed and are negative.   Last menstrual period 01/04/2018. Physical Exam  Constitutional: She appears well-developed.  HENT:  Head: Normocephalic.  Eyes: Pupils  are equal, round, and reactive to light.  Neck: Normal range of motion.  Cardiovascular: Normal rate.  Respiratory: Effort normal.  Neurological: She is alert.  Skin: Skin is warm.  Psychiatric: She has a normal mood and affect.  Examination of the left knee demonstrates good range of motion with full extension.  There is medial joint line tenderness.  Extensor mechanism is intact.  Patient has pretty reasonable patella mobility with negative apprehension.  Good endpoint on each side left and right after about 1/2-2 cm of lateral motion.  ACL is out on the left.  There is no posterolateral rotatory instability.  Ankle dorsiflexion plantarflexion is intact.  Pedal pulses are palpable.  Assessment/Plan Impression is left knee ACL tear and medial meniscal tear.  Plan is ACL reconstruction using hamstring autograft.  Also medial meniscal resection versus repair versus some combination of both.  The meniscal tear does look complex.  Patient understands the risks and benefits of surgery.  All questions answered.  Burnard BuntingG Scott Dean, MD 01/11/2018, 7:55 PM

## 2018-01-11 NOTE — Anesthesia Preprocedure Evaluation (Addendum)
Anesthesia Evaluation  Patient identified by MRN, date of birth, ID band Patient awake    Reviewed: Allergy & Precautions, H&P , NPO status , Patient's Chart, lab work & pertinent test results  Airway Mallampati: I  TM Distance: >3 FB Neck ROM: Full    Dental no notable dental hx. (+) Teeth Intact, Dental Advisory Given   Pulmonary Current Smoker,    Pulmonary exam normal breath sounds clear to auscultation       Cardiovascular Exercise Tolerance: Good negative cardio ROS   Rhythm:Regular Rate:Normal     Neuro/Psych Anxiety negative neurological ROS  negative psych ROS   GI/Hepatic negative GI ROS, Neg liver ROS,   Endo/Other  negative endocrine ROS  Renal/GU negative Renal ROS  negative genitourinary   Musculoskeletal   Abdominal   Peds  Hematology negative hematology ROS (+)   Anesthesia Other Findings   Reproductive/Obstetrics negative OB ROS                            Anesthesia Physical Anesthesia Plan  ASA: II  Anesthesia Plan: General   Post-op Pain Management:  Regional for Post-op pain   Induction: Intravenous  PONV Risk Score and Plan: 3 and Ondansetron, Dexamethasone and Midazolam  Airway Management Planned: LMA and Oral ETT  Additional Equipment:   Intra-op Plan:   Post-operative Plan: Extubation in OR  Informed Consent: I have reviewed the patients History and Physical, chart, labs and discussed the procedure including the risks, benefits and alternatives for the proposed anesthesia with the patient or authorized representative who has indicated his/her understanding and acceptance.   Dental advisory given  Plan Discussed with: CRNA  Anesthesia Plan Comments:         Anesthesia Quick Evaluation

## 2018-01-12 ENCOUNTER — Other Ambulatory Visit (INDEPENDENT_AMBULATORY_CARE_PROVIDER_SITE_OTHER): Payer: Self-pay | Admitting: Family

## 2018-01-12 ENCOUNTER — Ambulatory Visit (HOSPITAL_COMMUNITY)
Admission: RE | Admit: 2018-01-12 | Discharge: 2018-01-12 | Disposition: A | Discharge status: Home / Self Care | Payer: Self-pay | Source: Ambulatory Visit | Attending: Orthopedic Surgery | Admitting: Orthopedic Surgery

## 2018-01-12 ENCOUNTER — Other Ambulatory Visit: Payer: Self-pay

## 2018-01-12 ENCOUNTER — Ambulatory Visit (HOSPITAL_COMMUNITY): Payer: Self-pay | Admitting: Anesthesiology

## 2018-01-12 ENCOUNTER — Telehealth (INDEPENDENT_AMBULATORY_CARE_PROVIDER_SITE_OTHER): Payer: Self-pay | Admitting: Orthopedic Surgery

## 2018-01-12 ENCOUNTER — Telehealth (INDEPENDENT_AMBULATORY_CARE_PROVIDER_SITE_OTHER): Payer: Self-pay

## 2018-01-12 ENCOUNTER — Encounter (HOSPITAL_COMMUNITY)
Admission: RE | Disposition: A | Discharge status: Home / Self Care | Payer: Self-pay | Source: Ambulatory Visit | Attending: Orthopedic Surgery

## 2018-01-12 ENCOUNTER — Encounter (INDEPENDENT_AMBULATORY_CARE_PROVIDER_SITE_OTHER): Payer: Self-pay | Admitting: Orthopedic Surgery

## 2018-01-12 ENCOUNTER — Encounter (HOSPITAL_COMMUNITY): Payer: Self-pay | Admitting: Surgery

## 2018-01-12 DIAGNOSIS — S83512A Sprain of anterior cruciate ligament of left knee, initial encounter: Secondary | ICD-10-CM | POA: Insufficient documentation

## 2018-01-12 DIAGNOSIS — S83242A Other tear of medial meniscus, current injury, left knee, initial encounter: Secondary | ICD-10-CM | POA: Insufficient documentation

## 2018-01-12 DIAGNOSIS — F17210 Nicotine dependence, cigarettes, uncomplicated: Secondary | ICD-10-CM | POA: Insufficient documentation

## 2018-01-12 DIAGNOSIS — Y9366 Activity, soccer: Secondary | ICD-10-CM | POA: Insufficient documentation

## 2018-01-12 DIAGNOSIS — Y92322 Soccer field as the place of occurrence of the external cause: Secondary | ICD-10-CM | POA: Insufficient documentation

## 2018-01-12 DIAGNOSIS — F419 Anxiety disorder, unspecified: Secondary | ICD-10-CM | POA: Insufficient documentation

## 2018-01-12 HISTORY — PX: ANTERIOR CRUCIATE LIGAMENT REPAIR: SHX115

## 2018-01-12 LAB — POCT PREGNANCY, URINE: PREG TEST UR: NEGATIVE

## 2018-01-12 SURGERY — RECONSTRUCTION, KNEE, ACL
Anesthesia: General | Site: Knee | Laterality: Left

## 2018-01-12 MED ORDER — BUPIVACAINE HCL (PF) 0.25 % IJ SOLN
INTRAMUSCULAR | Status: DC | PRN
  Administered 2018-01-12: 30 mL

## 2018-01-12 MED ORDER — HYDROMORPHONE HCL 1 MG/ML IJ SOLN: 0.5000 mg | Freq: Once | INTRAMUSCULAR | Status: DC

## 2018-01-12 MED ORDER — DEXAMETHASONE SODIUM PHOSPHATE 10 MG/ML IJ SOLN
INTRAMUSCULAR | Status: DC | PRN
  Administered 2018-01-12: 10 mg via INTRAVENOUS

## 2018-01-12 MED ORDER — OXYCODONE HCL 5 MG PO TABS
ORAL_TABLET | ORAL | Status: AC
  Filled 2018-01-12: qty 1

## 2018-01-12 MED ORDER — PROPOFOL 10 MG/ML IV BOLUS
INTRAVENOUS | Status: AC
  Filled 2018-01-12: qty 20

## 2018-01-12 MED ORDER — SODIUM CHLORIDE 0.9 % IR SOLN
Status: DC | PRN
  Administered 2018-01-12: 21000 mL

## 2018-01-12 MED ORDER — HYDROMORPHONE HCL 1 MG/ML IJ SOLN
0.2500 mg | INTRAMUSCULAR | Status: DC | PRN
  Administered 2018-01-12 (×5): 0.5 mg via INTRAVENOUS

## 2018-01-12 MED ORDER — KETOROLAC TROMETHAMINE 30 MG/ML IJ SOLN
INTRAMUSCULAR | Status: AC
  Filled 2018-01-12: qty 1

## 2018-01-12 MED ORDER — BUPIVACAINE HCL (PF) 0.25 % IJ SOLN
INTRAMUSCULAR | Status: AC
  Filled 2018-01-12: qty 30

## 2018-01-12 MED ORDER — MORPHINE SULFATE (PF) 4 MG/ML IV SOLN
INTRAVENOUS | Status: AC
  Filled 2018-01-12: qty 2

## 2018-01-12 MED ORDER — TRAMADOL HCL 50 MG PO TABS: 50.0000 mg | ORAL_TABLET | Freq: Four times a day (QID) | ORAL | 0 refills | Status: DC | PRN

## 2018-01-12 MED ORDER — CLONIDINE HCL (ANALGESIA) 100 MCG/ML EP SOLN
EPIDURAL | Status: DC | PRN
  Administered 2018-01-12: 1 mL

## 2018-01-12 MED ORDER — ONDANSETRON HCL 4 MG/2ML IJ SOLN
INTRAMUSCULAR | Status: DC | PRN
  Administered 2018-01-12: 4 mg via INTRAVENOUS

## 2018-01-12 MED ORDER — HYDROMORPHONE HCL 1 MG/ML IJ SOLN
INTRAMUSCULAR | Status: AC
  Filled 2018-01-12: qty 1

## 2018-01-12 MED ORDER — ROPIVACAINE HCL 7.5 MG/ML IJ SOLN
INTRAMUSCULAR | Status: DC | PRN
  Administered 2018-01-12: 20 mL via PERINEURAL

## 2018-01-12 MED ORDER — ACETAMINOPHEN 10 MG/ML IV SOLN
INTRAVENOUS | Status: AC
  Filled 2018-01-12: qty 100

## 2018-01-12 MED ORDER — ACETAMINOPHEN 500 MG PO TABS
ORAL_TABLET | ORAL | Status: AC
  Administered 2018-01-12: 1000 mg via ORAL
  Filled 2018-01-12: qty 2

## 2018-01-12 MED ORDER — ACETAMINOPHEN 500 MG PO TABS
1000.0000 mg | ORAL_TABLET | Freq: Once | ORAL | Status: AC
  Administered 2018-01-12: 1000 mg via ORAL
  Filled 2018-01-12: qty 2

## 2018-01-12 MED ORDER — GABAPENTIN 300 MG PO CAPS
ORAL_CAPSULE | ORAL | Status: AC
  Administered 2018-01-12: 600 mg via ORAL
  Filled 2018-01-12: qty 2

## 2018-01-12 MED ORDER — MIDAZOLAM HCL 5 MG/5ML IJ SOLN
INTRAMUSCULAR | Status: DC | PRN
  Administered 2018-01-12: 2 mg via INTRAVENOUS

## 2018-01-12 MED ORDER — LIDOCAINE 2% (20 MG/ML) 5 ML SYRINGE
INTRAMUSCULAR | Status: DC | PRN
  Administered 2018-01-12: 70 mg via INTRAVENOUS

## 2018-01-12 MED ORDER — CEFAZOLIN SODIUM-DEXTROSE 2-4 GM/100ML-% IV SOLN
2.0000 g | INTRAVENOUS | Status: AC
  Administered 2018-01-12: 2 g via INTRAVENOUS

## 2018-01-12 MED ORDER — 0.9 % SODIUM CHLORIDE (POUR BTL) OPTIME
TOPICAL | Status: DC | PRN
  Administered 2018-01-12: 1000 mL

## 2018-01-12 MED ORDER — LACTATED RINGERS IV SOLN
INTRAVENOUS | Status: DC | PRN
  Administered 2018-01-12: 07:00:00 via INTRAVENOUS

## 2018-01-12 MED ORDER — CHLORHEXIDINE GLUCONATE 4 % EX LIQD: 60.0000 mL | Freq: Once | CUTANEOUS | Status: DC

## 2018-01-12 MED ORDER — PROPOFOL 10 MG/ML IV BOLUS
INTRAVENOUS | Status: DC | PRN
  Administered 2018-01-12: 140 mg via INTRAVENOUS

## 2018-01-12 MED ORDER — TRAMADOL HCL 50 MG PO TABS: 50.0000 mg | ORAL_TABLET | Freq: Two times a day (BID) | ORAL | 0 refills | Status: DC

## 2018-01-12 MED ORDER — MIDAZOLAM HCL 2 MG/2ML IJ SOLN
INTRAMUSCULAR | Status: AC
  Filled 2018-01-12: qty 2

## 2018-01-12 MED ORDER — MORPHINE SULFATE (PF) 4 MG/ML IV SOLN
INTRAVENOUS | Status: DC | PRN
  Administered 2018-01-12: 8 mg

## 2018-01-12 MED ORDER — CYCLOBENZAPRINE HCL 10 MG PO TABS: 10.0000 mg | ORAL_TABLET | Freq: Three times a day (TID) | ORAL | 0 refills | Status: DC | PRN

## 2018-01-12 MED ORDER — KETOROLAC TROMETHAMINE 30 MG/ML IJ SOLN
INTRAMUSCULAR | Status: DC | PRN
  Administered 2018-01-12: 30 mg via INTRAVENOUS

## 2018-01-12 MED ORDER — FENTANYL CITRATE (PF) 250 MCG/5ML IJ SOLN
INTRAMUSCULAR | Status: AC
  Filled 2018-01-12: qty 5

## 2018-01-12 MED ORDER — ONDANSETRON HCL 4 MG/2ML IJ SOLN
INTRAMUSCULAR | Status: AC
  Filled 2018-01-12: qty 2

## 2018-01-12 MED ORDER — EPINEPHRINE PF 1 MG/ML IJ SOLN
INTRAMUSCULAR | Status: AC
  Filled 2018-01-12: qty 4

## 2018-01-12 MED ORDER — BUPIVACAINE HCL (PF) 0.5 % IJ SOLN
INTRAMUSCULAR | Status: AC
  Filled 2018-01-12: qty 30

## 2018-01-12 MED ORDER — GABAPENTIN 300 MG PO CAPS
600.0000 mg | ORAL_CAPSULE | Freq: Once | ORAL | Status: AC
  Administered 2018-01-12: 600 mg via ORAL
  Filled 2018-01-12: qty 2

## 2018-01-12 MED ORDER — CLONIDINE HCL (ANALGESIA) 100 MCG/ML EP SOLN
150.0000 ug | EPIDURAL | Status: DC
  Filled 2018-01-12 (×2): qty 1.5

## 2018-01-12 MED ORDER — KETAMINE HCL 10 MG/ML IJ SOLN
INTRAMUSCULAR | Status: DC | PRN
  Administered 2018-01-12: 10 mg via INTRAVENOUS
  Administered 2018-01-12: 30 mg via INTRAVENOUS
  Administered 2018-01-12: 10 mg via INTRAVENOUS
  Administered 2018-01-12: 20 mg via INTRAVENOUS

## 2018-01-12 MED ORDER — KETAMINE HCL-SODIUM CHLORIDE 100-0.9 MG/10ML-% IV SOSY
PREFILLED_SYRINGE | INTRAVENOUS | Status: AC
  Filled 2018-01-12: qty 10

## 2018-01-12 MED ORDER — CEFAZOLIN SODIUM-DEXTROSE 2-4 GM/100ML-% IV SOLN
INTRAVENOUS | Status: AC
  Filled 2018-01-12: qty 100

## 2018-01-12 MED ORDER — EPINEPHRINE PF 1 MG/ML IJ SOLN
INTRAMUSCULAR | Status: DC | PRN
  Administered 2018-01-12: 4 mL

## 2018-01-12 MED ORDER — DEXAMETHASONE SODIUM PHOSPHATE 10 MG/ML IJ SOLN
INTRAMUSCULAR | Status: AC
  Filled 2018-01-12: qty 1

## 2018-01-12 MED ORDER — BUPIVACAINE-EPINEPHRINE (PF) 0.5% -1:200000 IJ SOLN
INTRAMUSCULAR | Status: AC
  Filled 2018-01-12: qty 30

## 2018-01-12 MED ORDER — BUPIVACAINE-EPINEPHRINE (PF) 0.5% -1:200000 IJ SOLN
INTRAMUSCULAR | Status: DC | PRN
  Administered 2018-01-12: 10 mL

## 2018-01-12 MED ORDER — MIDAZOLAM HCL 2 MG/2ML IJ SOLN
1.0000 mg | Freq: Once | INTRAMUSCULAR | Status: AC
  Administered 2018-01-12: 1 mg via INTRAVENOUS

## 2018-01-12 MED ORDER — OXYCODONE HCL 5 MG PO TABS
5.0000 mg | ORAL_TABLET | Freq: Once | ORAL | Status: AC
  Administered 2018-01-12: 5 mg via ORAL

## 2018-01-12 MED ORDER — HYDROMORPHONE HCL 1 MG/ML IJ SOLN
INTRAMUSCULAR | Status: AC
  Administered 2018-01-12: 0.5 mg
  Filled 2018-01-12: qty 1

## 2018-01-12 SURGICAL SUPPLY — 87 items
ANCHOR BUTTON TIGHTROPE ACL RT (Orthopedic Implant) ×3 IMPLANT
BANDAGE ESMARK 6X9 LF (GAUZE/BANDAGES/DRESSINGS) ×1 IMPLANT
BLADE CUTTER GATOR 3.5 (BLADE) ×3 IMPLANT
BLADE GREAT WHITE 4.2 (BLADE) ×2 IMPLANT
BLADE GREAT WHITE 4.2MM (BLADE) ×1
BLADE SURG 10 STRL SS (BLADE) ×3 IMPLANT
BLADE SURG 15 STRL LF DISP TIS (BLADE) ×2 IMPLANT
BLADE SURG 15 STRL SS (BLADE) ×6
BNDG CMPR 9X6 STRL LF SNTH (GAUZE/BANDAGES/DRESSINGS) ×1
BNDG CMPR MED 10X6 ELC LF (GAUZE/BANDAGES/DRESSINGS) ×1
BNDG CMPR MED 15X6 ELC VLCR LF (GAUZE/BANDAGES/DRESSINGS)
BNDG ELASTIC 6X10 VLCR STRL LF (GAUZE/BANDAGES/DRESSINGS) ×2 IMPLANT
BNDG ELASTIC 6X15 VLCR STRL LF (GAUZE/BANDAGES/DRESSINGS) ×1 IMPLANT
BNDG ESMARK 6X9 LF (GAUZE/BANDAGES/DRESSINGS) ×3
BUR OVAL 6.0 (BURR) ×3 IMPLANT
CLOSURE WOUND 1/2 X4 (GAUZE/BANDAGES/DRESSINGS) ×1
COVER SURGICAL LIGHT HANDLE (MISCELLANEOUS) ×3 IMPLANT
CUFF TOURNIQUET SINGLE 34IN LL (TOURNIQUET CUFF) ×2 IMPLANT
CUFF TOURNIQUET SINGLE 44IN (TOURNIQUET CUFF) IMPLANT
CUTTER FLIP II 9.5MM (INSTRUMENTS) ×2 IMPLANT
DECANTER SPIKE VIAL GLASS SM (MISCELLANEOUS) ×1 IMPLANT
DRAPE ARTHROSCOPY W/POUCH 114 (DRAPES) ×3 IMPLANT
DRAPE INCISE IOBAN 66X45 STRL (DRAPES) ×5 IMPLANT
DRAPE U-SHAPE 47X51 STRL (DRAPES) ×3 IMPLANT
DRSG PAD ABDOMINAL 8X10 ST (GAUZE/BANDAGES/DRESSINGS) ×3 IMPLANT
DRSG TEGADERM 4X4.75 (GAUZE/BANDAGES/DRESSINGS) ×8 IMPLANT
ELECT REM PT RETURN 9FT ADLT (ELECTROSURGICAL) ×3
ELECTRODE REM PT RTRN 9FT ADLT (ELECTROSURGICAL) ×1 IMPLANT
FILTER STRAW FLUID ASPIR (MISCELLANEOUS) ×2 IMPLANT
GAUZE SPONGE 4X4 12PLY STRL (GAUZE/BANDAGES/DRESSINGS) ×4 IMPLANT
GAUZE XEROFORM 1X8 LF (GAUZE/BANDAGES/DRESSINGS) ×3 IMPLANT
GEL DBM ALLOFUSE 1CC INJECT (Bone Implant) ×4 IMPLANT
GLOVE BIOGEL PI IND STRL 6.5 (GLOVE) IMPLANT
GLOVE BIOGEL PI IND STRL 8 (GLOVE) ×1 IMPLANT
GLOVE BIOGEL PI INDICATOR 6.5 (GLOVE) ×2
GLOVE BIOGEL PI INDICATOR 8 (GLOVE) ×2
GLOVE SURG ORTHO 8.0 STRL STRW (GLOVE) ×5 IMPLANT
GLOVE SURG SS PI 6.5 STRL IVOR (GLOVE) ×4 IMPLANT
GOWN STRL REUS W/ TWL LRG LVL3 (GOWN DISPOSABLE) ×3 IMPLANT
GOWN STRL REUS W/ TWL XL LVL3 (GOWN DISPOSABLE) ×1 IMPLANT
GOWN STRL REUS W/TWL LRG LVL3 (GOWN DISPOSABLE) ×6
GOWN STRL REUS W/TWL XL LVL3 (GOWN DISPOSABLE) ×3
IMMOBILIZER KNEE 22 (SOFTGOODS) ×2 IMPLANT
IV NS IRRIG 3000ML ARTHROMATIC (IV SOLUTION) ×14 IMPLANT
KIT BASIN OR (CUSTOM PROCEDURE TRAY) ×3 IMPLANT
KIT BIOCARTILAGE DEL W/SYRINGE (KITS) ×1 IMPLANT
KIT GRAFT DELIVERY BIOEXPRESS (KITS) ×2 IMPLANT
KIT ROOM TURNOVER OR (KITS) ×3 IMPLANT
MANIFOLD NEPTUNE II (INSTRUMENTS) ×3 IMPLANT
NDL 18GX1X1/2 (RX/OR ONLY) (NEEDLE) ×1 IMPLANT
NEEDLE 18GX1X1/2 (RX/OR ONLY) (NEEDLE) ×6 IMPLANT
NS IRRIG 1000ML POUR BTL (IV SOLUTION) ×3 IMPLANT
PACK ARTHROSCOPY DSU (CUSTOM PROCEDURE TRAY) ×3 IMPLANT
PAD ARMBOARD 7.5X6 YLW CONV (MISCELLANEOUS) ×6 IMPLANT
PAD CAST 4YDX4 CTTN HI CHSV (CAST SUPPLIES) ×1 IMPLANT
PADDING CAST COTTON 4X4 STRL (CAST SUPPLIES) ×3
PADDING CAST COTTON 6X4 STRL (CAST SUPPLIES) ×3 IMPLANT
PENCIL BUTTON HOLSTER BLD 10FT (ELECTRODE) ×2 IMPLANT
PK GRAFTLINK AUTO IMPLANT SYST (Anchor) ×3 IMPLANT
SET ARTHROSCOPY TUBING (MISCELLANEOUS) ×3
SET ARTHROSCOPY TUBING LN (MISCELLANEOUS) ×1 IMPLANT
SPONGE LAP 4X18 X RAY DECT (DISPOSABLE) ×6 IMPLANT
STRIP CLOSURE SKIN 1/2X4 (GAUZE/BANDAGES/DRESSINGS) ×1 IMPLANT
SUCTION FRAZIER HANDLE 10FR (MISCELLANEOUS) ×2
SUCTION TUBE FRAZIER 10FR DISP (MISCELLANEOUS) ×1 IMPLANT
SUT 2 FIBERLOOP 20 STRT BLUE (SUTURE) ×3
SUT ETHILON 3 0 PS 1 (SUTURE) ×5 IMPLANT
SUT MENISCAL KIT (KITS) IMPLANT
SUT MNCRL AB 3-0 PS2 18 (SUTURE) ×2 IMPLANT
SUT VIC AB 0 CT1 27 (SUTURE) ×6
SUT VIC AB 0 CT1 27XBRD ANBCTR (SUTURE) ×1 IMPLANT
SUT VIC AB 2-0 CT1 27 (SUTURE) ×9
SUT VIC AB 2-0 CT1 TAPERPNT 27 (SUTURE) ×1 IMPLANT
SUT VICRYL 0 UR6 27IN ABS (SUTURE) ×4 IMPLANT
SUTURE 2 FIBERLOOP 20 STRT BLU (SUTURE) ×1 IMPLANT
SYR 30ML LL (SYRINGE) ×3 IMPLANT
SYR 3ML LL SCALE MARK (SYRINGE) ×2 IMPLANT
SYR 5ML LL (SYRINGE) ×2 IMPLANT
SYR BULB IRRIGATION 50ML (SYRINGE) ×3 IMPLANT
SYR TB 1ML LUER SLIP (SYRINGE) ×1 IMPLANT
SYSTEM GRAFT IMPLANT AUTOGRAFT (Anchor) ×1 IMPLANT
TOWEL OR 17X24 6PK STRL BLUE (TOWEL DISPOSABLE) ×1 IMPLANT
TOWEL OR 17X26 10 PK STRL BLUE (TOWEL DISPOSABLE) ×6 IMPLANT
UNDERPAD 30X30 (UNDERPADS AND DIAPERS) ×1 IMPLANT
WAND STAR VAC 90 (SURGICAL WAND) ×2 IMPLANT
WATER STERILE IRR 1000ML POUR (IV SOLUTION) ×5 IMPLANT
WRAP KNEE MAXI GEL POST OP (GAUZE/BANDAGES/DRESSINGS) ×3 IMPLANT

## 2018-01-12 NOTE — Anesthesia Procedure Notes (Addendum)
Procedure Name: LMA Insertion Date/Time: 01/12/2018 7:38 AM Performed by: Shireen QuanButler, Bartley Vuolo R, CRNA Pre-anesthesia Checklist: Patient identified, Emergency Drugs available, Suction available and Patient being monitored Patient Re-evaluated:Patient Re-evaluated prior to induction Oxygen Delivery Method: Circle System Utilized Preoxygenation: Pre-oxygenation with 100% oxygen Induction Type: IV induction Ventilation: Mask ventilation without difficulty LMA: LMA inserted LMA Size: 4.0 Number of attempts: 2 (Patient coughing with 1st attempt, unable to ventilate despite multiple attempts at repositioning, LMA out, easy mask ventilation, 2nd attempt very smooth, with good ventilation) Placement Confirmation: positive ETCO2 Tube secured with: Tape Dental Injury: Teeth and Oropharynx as per pre-operative assessment

## 2018-01-12 NOTE — Telephone Encounter (Signed)
See below, can you advise on ultram and a muscle relaxer?

## 2018-01-12 NOTE — Telephone Encounter (Signed)
Per Dr August Saucerean called rx for ultram into patients pharmacy. She was given rx for oxy post op but patient states she wanted something not quite as strong.

## 2018-01-12 NOTE — Progress Notes (Signed)
Dr Sampson GoonFitzgerald phoned to make aware pt still In 10/10 pain after 2 of dilaudid

## 2018-01-12 NOTE — Telephone Encounter (Signed)
Patients mother also came in requesting RX for muscle relaxer be sent in to the pharmacy.

## 2018-01-12 NOTE — Telephone Encounter (Signed)
[facility] [place] [facility] [place] [facility] [facility], [facility], patient is aware. [facility]

## 2018-01-12 NOTE — Transfer of Care (Signed)
Immediate Anesthesia Transfer of Care Note  Patient: Debbie Griffin  Procedure(s) Performed: LEFT KNEE ANTERIOR CRUCIATE LIGAMENT (ACL) RECONSTRUCTION, PARTIAL MEDIAL MENISCECTOMY (Left Knee)  Patient Location: PACU  Anesthesia Type:GA combined with regional for post-op pain  Level of Consciousness: awake, oriented and patient cooperative  Airway & Oxygen Therapy: Patient Spontanous Breathing and Patient connected to nasal cannula oxygen  Post-op Assessment: Report given to RN, Post -op Vital signs reviewed and stable and Patient moving all extremities  Post vital signs: Reviewed and stable  Last Vitals:  Vitals:   01/12/18 0640 01/12/18 1022  BP:    Pulse:    Resp:    Temp: 36.9 C (P) 36.9 C  SpO2:      Last Pain:  Vitals:   01/12/18 0639  TempSrc: Oral  PainSc:       Patients Stated Pain Goal: 4 (01/12/18 0558)  Complications: No apparent anesthesia complications

## 2018-01-12 NOTE — Progress Notes (Signed)
Orthopedic Tech Progress Note Patient Details:  Debbie Griffin October 22, 1981 [id number]  Ortho Devices Type of Ortho Device: Crutches Ortho Device/Splint Interventions: Application   Post Interventions Patient Tolerated: Well Instructions Provided: Care of device   Nikki DomCrawford, Benz Vandenberghe 01/12/2018, 12:23 PM Viewed order from doctor's order list

## 2018-01-12 NOTE — Interval H&P Note (Signed)
History and Physical Interval Note:  01/12/2018 7:15 AM  Debbie Griffin  has presented today for surgery, with the diagnosis of left knee anterior cruciate ligament and medial meniscal tear  The various methods of treatment have been discussed with the patient and family. After consideration of risks, benefits and other options for treatment, the patient has consented to  Procedure(s): LEFT KNEE ANTERIOR CRUCIATE LIGAMENT (ACL) RECONSTRUCTION, PARTIAL MEDIAL MENISCECTOMY (Left) as a surgical intervention .  The patient's history has been reviewed, patient examined, no change in status, stable for surgery.  I have reviewed the patient's chart and labs.  Questions were answered to the patient's satisfaction.     Burnard BuntingG Scott Meadow [place]

## 2018-01-12 NOTE — Brief Op Note (Signed)
01/12/2018  10:19 AM  PATIENT:  Debbie Griffin  37 y.o. female  PRE-OPERATIVE DIAGNOSIS:  left knee anterior cruciate ligament and medial meniscal tear  POST-OPERATIVE DIAGNOSIS:  left knee anterior cruciate ligament and medial meniscal tear  PROCEDURE:  Procedure(s): LEFT KNEE ANTERIOR CRUCIATE LIGAMENT (ACL) RECONSTRUCTION, PARTIAL MEDIAL MENISCECTOMY  SURGEON:  Surgeon(s): Cammy Copaean, Imojean Yoshino Scott, MD  ASSISTANT: April Green RNFA  ANESTHESIA:   general  EBL: 6 ml    Total I/O In: 800 [I.V.:800] Out: 100 [Blood:100]  BLOOD ADMINISTERED: none  DRAINS: none   LOCAL MEDICATIONS USED:  Marcaine mso4 clonidine   SPECIMEN:  No Specimen  COUNTS:  YES  TOURNIQUET:  * Missing tourniquet times found for documented tourniquets in log: [id number] *  DICTATION: .Other Dictation: Dictation Number [id number]  PLAN OF CARE: Discharge to home after PACU  PATIENT DISPOSITION:  PACU - hemodynamically stable

## 2018-01-12 NOTE — Anesthesia Procedure Notes (Signed)
Anesthesia Regional Block: Adductor canal block   Pre-Anesthetic Checklist: ,, timeout performed, Correct Patient, Correct Site, Correct Laterality, Correct Procedure, Correct Position, site marked, Risks and benefits discussed, pre-op evaluation,  At surgeon's request and post-op pain management  Laterality: Left  Prep: Maximum Sterile Barrier Precautions used, chloraprep       Needles:  Injection technique: Single-shot  Needle Type: Echogenic Stimulator Needle     Needle Length: 9cm  Needle Gauge: 21     Additional Needles:   Procedures:,,,, ultrasound used (permanent image in chart),,,,  Narrative:  Start time: 01/12/2018 6:58 AM End time: 01/12/2018 7:08 AM Injection made incrementally with aspirations every 5 mL.  Performed by: Personally  Anesthesiologist: Gaynelle AduFitzgerald, Elliona Doddridge, MD  Additional Notes: 2% Lidocaine skin wheel.

## 2018-01-12 NOTE — Anesthesia Postprocedure Evaluation (Signed)
Anesthesia Post Note  Patient: Debbie Griffin  Procedure(s) Performed: LEFT KNEE ANTERIOR CRUCIATE LIGAMENT (ACL) RECONSTRUCTION, PARTIAL MEDIAL MENISCECTOMY (Left Knee)     Patient location during evaluation: PACU Anesthesia Type: General Level of consciousness: awake and alert Pain management: pain level controlled Vital Signs Assessment: post-procedure vital signs reviewed and stable Respiratory status: spontaneous breathing, nonlabored ventilation and respiratory function stable Cardiovascular status: blood pressure returned to baseline and stable Postop Assessment: no apparent nausea or vomiting Anesthetic complications: no    Last Vitals:  Vitals:   01/12/18 1200 01/12/18 1230  BP: 106/70 124/81  Pulse: 69 76  Resp: 11 14  Temp:    SpO2: 97% 98%    Last Pain:  Vitals:   01/12/18 1230  TempSrc:   PainSc: 5                  Elizabella Nolet,W. EDMOND

## 2018-01-13 NOTE — Op Note (Signed)
NAMBurna Sis:  Wittke, Shaneil              ACCOUNT NO.:  [id number]  MEDICAL RECORD NO.:  [id number]  LOCATION:                                 FACILITY:  PHYSICIAN:  Debbie BuntingG. Scott Mark Hassey, M.D.    DATE OF BIRTH:  Apr 17, 1981  DATE OF PROCEDURE: DATE OF DISCHARGE:  01/12/2018                              OPERATIVE REPORT   PREOPERATIVE DIAGNOSES:  Left knee anterior cruciate ligament tear and medial meniscal tear.  POSTOPERATIVE DIAGNOSES:  Left knee anterior cruciate ligament tear and medial meniscal tear.  PROCEDURES PERFORMED:  Left knee ACL reconstruction using hamstring autograft, semitendinosus and gracilis for 9.5-mm graft, and partial medial meniscectomy.  SURGEON:  Debbie BuntingG. Scott Jaecion Dempster, MD.  ASSISTANT:  April Chilton SiGreen, RNFA.  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  She presents now for operative management after the explanation of risks and benefits.  OPERATIVE FINDINGS:  Examination Under Anesthesia:  On range of motion, the patient had about 3 degrees of hyperextension and full flexion on the left-hand side with good stability to varus and valgus stresses at 0 and 30 degrees.  Patella was stable to lateral translation bilaterally, moving about 1.5 cm to 2 cm with good endpoint.  I could not dislocate either patella with the knee flexed to 20 degrees.  The patient did have ACL deficiency on the left with positive Lachman and a positive pivot shift.  There was no posterolateral rotatory instability noted on the left knee.  Diagnostic Arthroscopy: 1. 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 2. No loose bodies in the medial and lateral gutter. 3. Intact posterior cruciate ligament and torn anterior cruciate     ligament. 4. Tear of the medial meniscus involving about 60% anterior-posterior     width of the  meniscus over the posterior one-third circumference. 5. Intact lateral compartment, articular cartilage, and meniscus.  PROCEDURE IN DETAIL:  The patient was brought to the operating room, where general anesthetic was induced.  Preoperative antibiotics were administered and time-out was called.  Left leg was examined under anesthesia, pre-scrubbed with alcohol and Betadine, allowed to air dry, prepped with DuraPrep solution, and draped in a sterile manner.  Collier Flowersoban was used to cover the operative field.  Tourniquet was not utilized. The portals were anesthetized 5 mL each of Marcaine with epinephrine.  At this time, an incision was made over the pes bursa tendons.  The semitendinosus was harvested, with care being taken to avoid injury to the saphenous nerve.  The semitendinosus was prepared on the back table to a size of 7.5 mm.  Due to the deficiency of the posterior horn medial meniscus and her high activity level, it was elected to have the gracilis to the tendon.  This gave a 9.5-mm graft, which was prepared on the back table using dual EndoButton technique.  At this time, concurrent with the graft preparation, diagnostic arthroscopy was performed through anterior-inferior lateral and the anterior medial portal.  Diagnostic arthroscopy demonstrated some wear on the undersurface of the  patella, but not the trochlea.  There was a tear on the posterior horn of medial meniscus, which was debrided back to a stable rim using a combination of basket, punch, and shaver.  All in all, about 60% of that meniscal volume was involved posteriorly.  The ACL was torn.  Notchplasty and debridement of ACL stump were performed. The lateral compartment was intact.  At this time, a 9.5-mm FlipCutter was utilized to place the tunnel in the 3 o'clock position on that lateral wall.  The FlipCutter also was utilized to place the tibial tunnel at the native ACL footprint.  Graft was passed and secured on  the tibial side under direct visualization after the Endobutton was passed. It was then secured on the tibial side also using EndoButton technique in full extension.  This gave excellent graft fixation.  It should be noted that the graft material StimuBlast bone graft was placed into the femoral and tibial tunnels for added fixation.  A nice snug fit was achieved.  At this time, the knee was taken through a range of motion and was found to have excellent stability.  A thorough irrigation was performed on all incisions as well as in the knee joint.  Portals and harvest sites were anesthetized with a combination of Marcaine, morphine, and clonidine.  The portal sites were closed using 2-0 Vicryl and 3-0 nylon.  The harvest site was closed using 0 Vicryl suture, 2-0 Vicryl suture, and 3-0 Monocryl.  Waterproof dressings were applied.  The patient tolerated the procedure well without immediate complications.  The bulky Ace wrap and knee immobilizer were placed.     Debbie Bunting, M.D.   ______________________________ Reece Agar. Dorene Grebe, M.D.    GSD/MEDQ  D:  01/12/2018  T:  01/13/2018  Job:  [id number]

## 2018-01-13 NOTE — Op Note (Deleted)
  The note originally documented on this encounter has been moved the the encounter in which it belongs.  

## 2018-01-15 ENCOUNTER — Telehealth (INDEPENDENT_AMBULATORY_CARE_PROVIDER_SITE_OTHER): Payer: Self-pay | Admitting: Orthopedic Surgery

## 2018-01-15 ENCOUNTER — Encounter (HOSPITAL_COMMUNITY): Payer: Self-pay | Admitting: Orthopedic Surgery

## 2018-01-15 NOTE — Telephone Encounter (Signed)
Patient left a voicemail and said she just received a call. Please call her back # [phone]

## 2018-01-15 NOTE — Telephone Encounter (Signed)
Patient's mother Wess Botts(Lisa Pine) called advised due to a 6 year struggle with Opioids she took patients pain medicine and destroyed it. Patient only have the muscle relaxer and antiinflammatory medicine to take. The number [facility] contact [facility] is [phone]

## 2018-01-16 NOTE — Telephone Encounter (Signed)
Wanted to make sure that you were aware of this.

## 2018-01-16 NOTE — Telephone Encounter (Signed)
Patient stated that she had a miss call from our office yesterday.  She also stated that she had no discharge instructions as to when to come back or when she can take the main bandages off, etc.  CB#[phone].  Thank you.

## 2018-01-16 NOTE — Telephone Encounter (Signed)
IC s/w patient. Advised needed to leave waterproof dressing intact until first post op visit. Ok to shower but not submerge leg in water. Advised to keep elevated and ice. She will call with any further questions. Post op appt scheduled for her for 10 days post op on 01/22/18. Patient verbalized understanding.

## 2018-01-16 NOTE — Telephone Encounter (Signed)
Got it no opiods thx

## 2018-01-22 ENCOUNTER — Ambulatory Visit (INDEPENDENT_AMBULATORY_CARE_PROVIDER_SITE_OTHER): Payer: Self-pay | Admitting: Orthopedic Surgery

## 2018-01-22 ENCOUNTER — Encounter (INDEPENDENT_AMBULATORY_CARE_PROVIDER_SITE_OTHER): Payer: Self-pay | Admitting: Orthopedic Surgery

## 2018-01-22 DIAGNOSIS — Z9889 Other specified postprocedural states: Secondary | ICD-10-CM

## 2018-01-24 ENCOUNTER — Encounter (INDEPENDENT_AMBULATORY_CARE_PROVIDER_SITE_OTHER): Payer: Self-pay | Admitting: Orthopedic Surgery

## 2018-01-24 NOTE — Progress Notes (Signed)
   Post-Op Visit Note   Patient: Debbie Griffin           Date of Birth: May 23, 1981           MRN: [id number] Visit Date: 01/22/2018 PCP: Patient, No Pcp Per   Assessment & Plan:  Chief Complaint:  Chief Complaint  Patient presents with  . Left Knee - Routine Post Op   Visit Diagnoses:  1. S/P left knee surgery     Plan: Debbie Griffin is a patient who is now 2 weeks out left knee ACL reconstruction with partial posterior medial meniscectomy.  She is up to 65 degrees on the CPM machine.  Taking Ultram for pain.  On examination incision is intact.  Graft is stable.  She needs to work on full extension because she has about a 15 degree flexion contracture.  No calf tenderness present.  She is on aspirin.  Plan is weightbearing as tolerated working on full extension heel to toe.  Continue with CPM use but start physical therapy to work on full extension.  Follow-Up Instructions: Return in about 11 days (around 02/02/2018).   Orders:  Orders Placed This Encounter  Procedures  . Ambulatory referral to Physical Therapy   No orders of the defined types were placed in this encounter.   Imaging: No results found.  PMFS History: Patient Active Problem List   Diagnosis Date Noted  . Left knee injury, subsequent encounter 08/04/2017  . Opiate abuse, episodic (HCC) 10/31/2016  . 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 Anxiety   . Dislocation of the knee cap    left 2018  . Miscarriage   . Multiple gastric ulcers   . Ovarian cyst     Family History  Problem Relation Age of Onset  . Thyroid disease Father   . Other Neg Hx     Past Surgical History:  Procedure Laterality Date  . ANTERIOR CRUCIATE LIGAMENT REPAIR Left 01/12/2018   Procedure: LEFT KNEE ANTERIOR CRUCIATE LIGAMENT (ACL) RECONSTRUCTION, PARTIAL MEDIAL MENISCECTOMY;  Surgeon: Cammy Copaean, Gregory Scott, MD;  Location: [facility] OR;  Service: Orthopedics;   Laterality: Left;  . ESOPHAGOGASTRODUODENOSCOPY    . WISDOM TOOTH EXTRACTION     Social History   Occupational History  . Not on file  Tobacco Use  . Smoking status: Current Some Day Smoker    Types: Cigarettes  . Smokeless tobacco: Never Used  Substance and Sexual Activity  . Alcohol use: Yes    Comment: rarely  . Drug use: No    Comment: heroin (past)  . Sexual activity: Yes    Birth control/protection: None, IUD

## 2018-01-31 ENCOUNTER — Ambulatory Visit (INDEPENDENT_AMBULATORY_CARE_PROVIDER_SITE_OTHER): Payer: Self-pay | Admitting: Orthopedic Surgery

## 2018-01-31 ENCOUNTER — Encounter (INDEPENDENT_AMBULATORY_CARE_PROVIDER_SITE_OTHER): Payer: Self-pay | Admitting: Orthopedic Surgery

## 2018-01-31 DIAGNOSIS — G8929 Other chronic pain: Secondary | ICD-10-CM

## 2018-01-31 DIAGNOSIS — M25561 Pain in right knee: Secondary | ICD-10-CM

## 2018-01-31 DIAGNOSIS — Z9889 Other specified postprocedural states: Secondary | ICD-10-CM

## 2018-01-31 NOTE — Addendum Note (Signed)
Addended byPrescott Parma: Cypher Paule on: 01/31/2018 02:31 PM   Modules accepted: Orders

## 2018-01-31 NOTE — Progress Notes (Addendum)
   Post-Op Visit Note   Patient: Debbie Griffin           Date of Birth: 12/09/1980           MRN: [id number] Visit Date: 01/31/2018 PCP: Patient, No Pcp Per   Assessment & Plan:  Chief Complaint:  Chief Complaint  Patient presents with  . Left Knee - Routine Post Op   Visit Diagnoses:  1. S/P left knee surgery     Plan: Toni AmendCourtney is a patient is now 3 weeks out left knee ACL reconstruction partial medial meniscectomy.  She is been doing recently well.  Fully weightbearing.  On exam she has about 5 degree flexion contracture which is an improvement.  She is bending the 90.  Graft is stable.  No calf tenderness.  Plan is to start physical therapy this week.  Continue with CPM machine is much as possible until the machine is discontinued.  I will see her back in 4 weeks.  Examination of the right knee demonstrates ACL laxity but with good range of motion.  There is no posterior lateral rotatory instability.  Collateral ligaments are stable to stress at 0 and 30 degrees.  This goes along with her history of instability in the right knee dating back 10 years ago.  PCL is intact.  Plan is MRI right knee to evaluate instability with possible further surgical intervention to follow.  Follow-Up Instructions: Return in about 4 weeks (around 02/28/2018).   Orders:  No orders of the defined types were placed in this encounter.  No orders of the defined types were placed in this encounter.   Imaging: No results found.  PMFS History: Patient Active Problem List   Diagnosis Date Noted  . Left knee injury, subsequent encounter 08/04/2017  . Opiate abuse, episodic (HCC) 10/31/2016  . 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 Anxiety   . Dislocation of the knee cap    left 2018  . Miscarriage   . Multiple gastric ulcers   . Ovarian cyst     Family History  Problem Relation Age of Onset  . Thyroid disease  Father   . Other Neg Hx     Past Surgical History:  Procedure Laterality Date  . ANTERIOR CRUCIATE LIGAMENT REPAIR Left 01/12/2018   Procedure: LEFT KNEE ANTERIOR CRUCIATE LIGAMENT (ACL) RECONSTRUCTION, PARTIAL MEDIAL MENISCECTOMY;  Surgeon: Cammy Copaean, Axcel Horsch Scott, MD;  Location: [facility] OR;  Service: Orthopedics;  Laterality: Left;  . ESOPHAGOGASTRODUODENOSCOPY    . WISDOM TOOTH EXTRACTION     Social History   Occupational History  . Not on file  Tobacco Use  . Smoking status: Current Some Day Smoker    Types: Cigarettes  . Smokeless tobacco: Never Used  Substance and Sexual Activity  . Alcohol use: Yes    Comment: rarely  . Drug use: No    Comment: heroin (past)  . Sexual activity: Yes    Birth control/protection: None, IUD

## 2018-01-31 NOTE — Brief Op Note (Signed)
01/12/2018  9:57 AM  PATIENT:  Burna Sisourtney Kazmi  37 y.o. female  PRE-OPERATIVE DIAGNOSIS:  left knee anterior cruciate ligament and medial meniscal tear  POST-OPERATIVE DIAGNOSIS:  left knee anterior cruciate ligament and medial meniscal tear  PROCEDURE:  Procedure(s): LEFT KNEE ANTERIOR CRUCIATE LIGAMENT (ACL) RECONSTRUCTION, PARTIAL MEDIAL MENISCECTOMY  SURGEON:  Surgeon(s): August Saucerean, Corrie MckusickGregory Scott, MD  ASSISTANT: green rnfa  ANESTHESIA:   general  EBL: 25 ml    No intake/output data recorded.  BLOOD ADMINISTERED: none  DRAINS: none   LOCAL MEDICATIONS USED:  none  SPECIMEN:  No Specimen  COUNTS:  YES  TOURNIQUET:  * Missing tourniquet times found for documented tourniquets in log: [id number] *  DICTATION: .Other Dictation: Dictation Number [id number]  PLAN OF CARE: Discharge to home after PACU  PATIENT DISPOSITION:  PACU - hemodynamically stable

## 2018-02-01 ENCOUNTER — Ambulatory Visit: Payer: Medicaid Other | Attending: Orthopedic Surgery | Admitting: Physical Therapy

## 2018-02-01 NOTE — Op Note (Signed)
NAME:  Debbie Griffin, Debbie Griffin                   ACCOUNT NO.:  MEDICAL RECORD NO.:  [id number]  LOCATION:                                 FACILITY:  PHYSICIAN:  Burnard Bunting, M.D.    DATE OF BIRTH:  29-Aug-1981  DATE OF PROCEDURE: DATE OF DISCHARGE:                              OPERATIVE REPORT   PREOPERATIVE DIAGNOSIS:  Left knee anterior cruciate ligament tear and medial meniscal tear.  POSTOPERATIVE DIAGNOSIS:  Left knee anterior cruciate ligament tear and medial meniscal tear.  PROCEDURE:  Left knee ACL reconstruction using hamstring autograft, dual Endobutton fixation, and partial medial meniscectomy.  SURGEON:  Burnard Bunting, M.D.  ASSIST:  April Chilton Si, RNFA.  INDICATIONS:  [name] is a 37 year old patient with left knee pain, who presents for operative management after explanation of risks and benefits.  MRI scan shows ACL tear and medial meniscal tear.  She is very active.  MRI scan is consistent with ACL tear and medial meniscal tear and the patient is having symptomatic instability.  PROCEDURE IN DETAIL:  The patient was brought to the operating room, where general endotracheal anesthesia was induced.  Preoperative antibiotics administered.  Time-out was called.  Left leg was examined under anesthesia, found to have positive Lachman with no posterolateral rotatory instability.  Varus and valgus stress demonstrates no collateral ligament instability at 0 and 30 degrees.  PCL was intact. Following examination under anesthesia, leg was prescrubbed with alcohol and Betadine, allowed to air dry, prepped with DuraPrep solution and draped in a sterile manner.  Tourniquet was not utilized.  Incision was made over the pes bursa tendons.  Semitendinosus was harvested using tendon graft harvester.  The care was taken to avoid injury to the saphenous nerve.  Prepared on the back table using dual EndoButton technique.  Initial graft preparation demonstrated a graft, which was 7.5  mm.  Gracilis was harvested and added, which made the graft size 9 mm.  Concurrent with graft preparation, anterior inferolateral and anterior inferomedial portals were established.  Diagnostic arthroscopy demonstrated ACL tear and medial meniscal tear.  The medial meniscal tear was debrided back to a stable rim.  The lateral meniscus was intact.  The joint surfaces were intact.  Following diagnostic arthroscopy and inspection, notchplasty was performed.  Medial meniscus was resected back to a stable rim, which required about 50% resection of that volume of the meniscus.  The femoral tunnel was drilled in the 3 o'clock position, 9 mm.  Tibial tunnel was drilled in the posterior aspect of the native ACL footprint.  Graft was passed and secured under direct visualization of the Endobutton on the femoral side.  Also on the tibial side, it was secured with the knee in extension.  Good graft stability was achieved.  Good isometry was achieved.  At this time, thorough irrigation was performed in the knee joint.  ACL graft looked very good.  Thorough irrigation was performed and the portals were closed using 2-0 Vicryl and 3-0 nylon, followed by graft harvesting site closed by 0 Vicryl, 2-0 Vicryl, and a 3-0 Monocryl.  Impervious dressing was placed along with bulky dressing and knee immobilizer.  The patient  tolerated the procedure well without immediate complication.  Transferred to the recovery room in stable condition.     Burnard BuntingG. Scott Jason Hauge, M.D.     GSD/MEDQ  D:  01/31/2018  T:  01/31/2018  Job:  [id number]

## 2018-02-15 ENCOUNTER — Telehealth (INDEPENDENT_AMBULATORY_CARE_PROVIDER_SITE_OTHER): Payer: Self-pay | Admitting: Orthopedic Surgery

## 2018-02-15 DIAGNOSIS — Z9889 Other specified postprocedural states: Secondary | ICD-10-CM

## 2018-02-15 NOTE — Telephone Encounter (Signed)
Called and s/w patient. She was unable to go to [place] P.T. As referred due to her insurance. She requested order for P[facility] on [address]. I submitted referral.

## 2018-02-15 NOTE — Telephone Encounter (Signed)
Patient requesting a physical therapy referral ASAP. She needs a call back # [phone]

## 2018-02-19 ENCOUNTER — Telehealth (INDEPENDENT_AMBULATORY_CARE_PROVIDER_SITE_OTHER): Payer: Self-pay | Admitting: Orthopedic Surgery

## 2018-02-19 DIAGNOSIS — M25561 Pain in right knee: Secondary | ICD-10-CM

## 2018-02-19 NOTE — Telephone Encounter (Signed)
IC advised order submitted for this.

## 2018-02-19 NOTE — Telephone Encounter (Signed)
Patient called to get referral for PT for in network in [facility].   She also inquired about an MRI.  \Please call patient to advise.

## 2018-02-19 NOTE — Telephone Encounter (Signed)
Ok for other knee mri to r o acl pls clal thx

## 2018-02-19 NOTE — Telephone Encounter (Signed)
Patient requesting for MRI to be ordered of the opposite knee. She states she is certain she has torn her ACL in the right knee as well. Ok to order? She said she had previously discussed this with you.

## 2018-02-22 ENCOUNTER — Ambulatory Visit: Payer: Self-pay | Attending: Family Medicine | Admitting: Physical Therapy

## 2018-02-22 ENCOUNTER — Other Ambulatory Visit: Payer: Self-pay

## 2018-02-22 ENCOUNTER — Encounter: Payer: Self-pay | Admitting: Physical Therapy

## 2018-02-22 DIAGNOSIS — M25562 Pain in left knee: Secondary | ICD-10-CM | POA: Insufficient documentation

## 2018-02-22 DIAGNOSIS — M6281 Muscle weakness (generalized): Secondary | ICD-10-CM | POA: Insufficient documentation

## 2018-02-22 DIAGNOSIS — R29898 Other symptoms and signs involving the musculoskeletal system: Secondary | ICD-10-CM | POA: Insufficient documentation

## 2018-02-22 DIAGNOSIS — G8929 Other chronic pain: Secondary | ICD-10-CM | POA: Insufficient documentation

## 2018-02-22 NOTE — Therapy (Signed)
[facility] [address] [place], [place], [address] Phone: [phone]   Fax:  [phone]  Physical Therapy Evaluation  Patient Details  Name: Debbie Griffin MRN: [id number] Date of Birth: 1981/02/16 Referring Provider: Cammy Copa, MD   Encounter Date: 02/22/2018  PT End of Session - 02/22/18 1410    Visit Number  1    Number of Visits  13    Date for PT Re-Evaluation  04/05/18    Authorization Type  CAFA    PT Start Time  1331    PT Stop Time  1410    PT Time Calculation (min)  39 min    Activity Tolerance  Patient tolerated treatment well    Behavior During Therapy  [facility] for tasks assessed/performed       Past Medical History:  Diagnosis Date  . Anxiety   . Dislocation of the knee cap    left 2018  . Miscarriage   . Multiple gastric ulcers   . Ovarian cyst     Past Surgical History:  Procedure Laterality Date  . ANTERIOR CRUCIATE LIGAMENT REPAIR Left 01/12/2018   Procedure: LEFT KNEE ANTERIOR CRUCIATE LIGAMENT (ACL) RECONSTRUCTION, PARTIAL MEDIAL MENISCECTOMY;  Surgeon: Cammy Copa, MD;  Location: [facility] OR;  Service: Orthopedics;  Laterality: Left;  . ESOPHAGOGASTRODUODENOSCOPY    . WISDOM TOOTH EXTRACTION      There were no vitals filed for this visit.   Subjective Assessment - 02/22/18 1327    Subjective  pt is a 37 y.o F s/p L ACL reconstruction on 01/12/2018 due to an injury that may have occurred over 10 years ago but pt is unsure as to the MOI. things are going pretty good, I am surprised how was I am walking. I am having issues with going down stairs. I haven't been used the brace. plans to get more imaging for the R knee due to potential ACL involvement as well.     How long can you sit comfortably?  45 -60 min    How long can you stand comfortably?  unlimited    How long can you walk comfortably?  unlimited    Diagnostic tests  MRI    Patient Stated Goals  return to soccer and all activities     Currently in Pain?  Yes    Pain Score  3  at worst 7/10    Pain Orientation  Left;Anterior    Pain Descriptors / Indicators  Sharp    Pain Type  Surgical pain    Pain Onset  More than a month ago    Pain Frequency  Intermittent    Aggravating Factors   weather, descending stairs, laying bend    Pain Relieving Factors  ice, CBD cream, ibuprofen         [facility] PT Assessment - 02/22/18 1325      Assessment   Medical Diagnosis  S/P L ACL reconstruction    Referring Provider  Cammy Copa, MD    Onset Date/Surgical Date  01/12/18    Hand Dominance  Right    Next MD Visit  -- couple weeks    Prior Therapy  Yes      Precautions   Precautions  None      Restrictions   Weight Bearing Restrictions  No      Balance Screen   Has the patient fallen in the past 6 months  No fell due to dislocation of the patella  How many times?  3    Has the patient had a decrease in activity level because of a fear of falling?   No    Is the patient reluctant to leave their home because of a fear of falling?   No      Home Environment   Living Environment  Private residence    Living Arrangements  Children;Spouse/significant other;Parent    Available Help at Discharge  Family;Available PRN/intermittently    Type of Home  House    Home Access  Stairs to enter    Entrance Stairs-Number of Steps  3    Entrance Stairs-Rails  Can reach both    Home Layout  Two level    Alternate Level Stairs-Number of Steps  14    Alternate Level Stairs-Rails  Right ascending      Prior Function   Level of Independence  Independent    Vocation  Full time Administrator, sportsemployment Logistics specialist    Vocation Requirements  sitting      Cognition   Overall Cognitive Status  Within Functional Limits for tasks assessed      Observation/Other Assessments   Focus on Therapeutic Outcomes (FOTO)   54% limited predicted 35% lmited      Coordination   Gross Motor Movements are Fluid and Coordinated  Yes      ROM /  Strength   AROM / PROM / Strength  AROM;PROM;Strength      AROM   AROM Assessment Site  Knee    Right/Left Knee  Right;Left    Right Knee Extension  -- +4 (hyperextends    Right Knee Flexion  132    Left Knee Extension  7    Left Knee Flexion  124      PROM   PROM Assessment Site  Knee    Right/Left Knee  Right;Left      Strength   Strength Assessment Site  Knee    Right/Left Knee  Right;Left    Right Knee Flexion  5/5    Right Knee Extension  5/5    Left Knee Flexion  4/5    Left Knee Extension  4/5      Palpation   Patella mobility  hypermobility of the R patella and L is WFL    Palpation comment  tenderness inferior to the L patella,              Objective measurements completed on examination: See above findings.      [facility] Adult PT Treatment/Exercise - 02/22/18 1410      Knee/Hip Exercises: Standing   Heel Raises  1 set;Both;20 reps      Knee/Hip Exercises: Supine   Bridges  1 set;Both;Strengthening;10 reps    Straight Leg Raises  Strengthening;Left;2 sets;10 reps with sustained quad set      Knee/Hip Exercises: Sidelying   Hip ABduction  2 sets;10 reps;Left;Strengthening             PT Education - 02/22/18 1405    Education provided  Yes    Education Details  evaluation findings, POC, goals. HEP with proper form/ rationale.     Person(s) Educated  Patient    Methods  Explanation;Verbal cues;Handout;Demonstration    Comprehension  Verbalized understanding;Verbal cues required;Returned demonstration       PT Short Term Goals - 02/22/18 1415      PT SHORT TERM GOAL #1   Title  pt to be i with inital HEP     Time  3    Period  Weeks    Status  New    Target Date  03/15/18        PT Long Term Goals - 02/22/18 1503      PT LONG TERM GOAL #1   Title  pt to increase knee extension to </= 3 degrees to promote functional mobility required for efficient gait pattern    Time  6    Period  Weeks    Status  New    Target Date  04/05/18       PT LONG TERM GOAL #2   Title  pt to increase LLE strength to >/= 4+/5 in all planes to promote knee stability with walking/ standing and dynamic activities    Time  6    Period  Weeks    Status  New    Target Date  04/05/18      PT LONG TERM GOAL #3   Title  pt to be able to perfrom dynamic and plyometric activities with </= 1/10 pain for pt's goal of returning to soccer and snowboarding    Time  6    Period  Weeks    Status  New    Target Date  04/05/18      PT LONG TERM GOAL #4   Title  increase FOTO score to </= 35% limited to demo improvement in function    Time  6    Period  Weeks    Status  New    Target Date  04/05/18      PT LONG TERM GOAL #5   Title  pt to be I with all HEP given as of last visit to maintain and progress function    Time  6    Period  Weeks    Status  New    Target Date  04/05/18             Plan - 02/22/18 1411    Clinical Impression Statement  pt is a 37 y.o s/p L ACL reconstruction on 01/12/2018. She exhibits functional knee ROM in the L knee with mild limitation with extension compared bil. limited strength in the LLE as expected following surgery. She would benefit from physical therapy to improve knee strength/ ROM, improve dynmic activity and return pt to PLOF by addressing the deficits listed.     Clinical Presentation  Stable    Clinical Decision Making  Low    PT Frequency  2x / week    PT Duration  6 weeks    PT Treatment/Interventions  ADLs/Self Care Home Management;Cryotherapy;Electrical Stimulation;Iontophoresis 4mg /ml Dexamethasone;Moist Heat;Therapeutic exercise;Therapeutic activities;Functional mobility training;Stair training;Gait training;Balance training;Ultrasound;Neuromuscular re-education;Patient/family education;Manual techniques;Vasopneumatic Device;Taping;Dry needling;Passive range of motion    PT Next Visit Plan  review/ update HEP, stretching hamstring/ quad, LLE strengthening, (protocol 3/21 pt is 5 weeks post op)     PT Home Exercise Plan  SLR with quad set, sidelying hip abduction, heel raise, bridge    Consulted and Agree with Plan of Care  Patient       Patient will benefit from skilled therapeutic intervention in order to improve the following deficits and impairments:  Decreased activity tolerance, Decreased mobility, Decreased strength, Pain, Increased edema, Decreased endurance  Visit Diagnosis: Chronic pain of left knee  Muscle weakness (generalized)     Problem List Patient Active Problem List   Diagnosis Date Noted  . Left knee injury, subsequent encounter 08/04/2017  . Opiate abuse, episodic (HCC) 10/31/2016  .  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

## 2018-02-24 ENCOUNTER — Ambulatory Visit
Admission: RE | Admit: 2018-02-24 | Discharge: 2018-02-24 | Disposition: A | Discharge status: Home / Self Care | Payer: Medicaid Other | Source: Ambulatory Visit | Attending: Orthopedic Surgery | Admitting: Orthopedic Surgery

## 2018-02-24 DIAGNOSIS — G8929 Other chronic pain: Secondary | ICD-10-CM

## 2018-02-24 DIAGNOSIS — M25561 Pain in right knee: Principal | ICD-10-CM

## 2018-02-27 ENCOUNTER — Encounter: Payer: Self-pay | Admitting: Physical Therapy

## 2018-02-27 ENCOUNTER — Ambulatory Visit: Payer: Self-pay | Admitting: Physical Therapy

## 2018-02-27 DIAGNOSIS — M25562 Pain in left knee: Secondary | ICD-10-CM

## 2018-02-27 DIAGNOSIS — M6281 Muscle weakness (generalized): Secondary | ICD-10-CM

## 2018-02-27 DIAGNOSIS — G8929 Other chronic pain: Secondary | ICD-10-CM

## 2018-02-27 DIAGNOSIS — R29898 Other symptoms and signs involving the musculoskeletal system: Secondary | ICD-10-CM

## 2018-02-27 NOTE — Therapy (Signed)
[facility] [address] [place], [place], [address] Phone: [phone]   Fax:  [phone]  Physical Therapy Treatment  Patient Details  Name: Debbie Griffin MRN: [id number] Date of Birth: 1981-01-13 Referring Provider: Cammy Copaean, Gregory Scott, MD   Encounter Date: 02/27/2018  PT End of Session - 02/27/18 1746    Visit Number  2    Number of Visits  13    Date for PT Re-Evaluation  04/05/18    PT Start Time  1639    PT Stop Time  1735    PT Time Calculation (min)  56 min    Activity Tolerance  Patient tolerated treatment well    Behavior During Therapy  [facility] for tasks assessed/performed       Past Medical History:  Diagnosis Date  . Anxiety   . Dislocation of the knee cap    left 2018  . Miscarriage   . Multiple gastric ulcers   . Ovarian cyst     Past Surgical History:  Procedure Laterality Date  . ANTERIOR CRUCIATE LIGAMENT REPAIR Left 01/12/2018   Procedure: LEFT KNEE ANTERIOR CRUCIATE LIGAMENT (ACL) RECONSTRUCTION, PARTIAL MEDIAL MENISCECTOMY;  Surgeon: Cammy Copaean, Gregory Scott, MD;  Location: [facility] OR;  Service: Orthopedics;  Laterality: Left;  . ESOPHAGOGASTRODUODENOSCOPY    . WISDOM TOOTH EXTRACTION      There were no vitals filed for this visit.  Subjective Assessment - 02/27/18 1639    Subjective  Pain never goes below a 3/10.    Currently in Pain?  Yes    Pain Score  3     Pain Location  Knee    Pain Descriptors / Indicators  Tightness;Sore;Sharp sharp under knee cap    Pain Type  Surgical pain    Pain Frequency  Constant    Aggravating Factors   pressing knee against something    Pain Relieving Factors  ice elevation over the counter,  moving around after sitting    Multiple Pain Sites  -- right 4-5/10 knee,  back, neck hurt a lot sleeping difficult          [facility] PT Assessment - 02/27/18 0001      AROM   Left Knee Flexion  125            No data recorded       [facility] Adult PT  Treatment/Exercise - 02/27/18 0001      Knee/Hip Exercises: Standing   Wall Squat  10 reps    Wall Squat Limitations  cued for small motions,  cued for heel press    SLS  SLS ,  30 + seconds     Other Standing Knee Exercises  tip toe walking cued for small steps    Other Standing Knee Exercises  weight shifting      Knee/Hip Exercises: Supine   Quad Sets  10 reps    Heel Slides  10 reps    Straight Leg Raises  Strengthening 10 X 2 sets  with quad set 1 set with 2 LBS      Knee/Hip Exercises: Prone   Hamstring Curl  10 reps feels good      Vasopneumatic   Number Minutes Vasopneumatic   15 minutes    Vasopnuematic Location   Knee    Vasopneumatic Pressure  Low    Vasopneumatic Temperature   34      Manual Therapy   Manual therapy comments  soft tissue work scar antrior knee,  scar tissue  partially softened.              PT Education - 02/27/18 1746    Education provided  Yes    Education Details  exercise form,      Person(s) Educated  Patient    Methods  Explanation;Demonstration    Comprehension  Verbalized understanding;Returned demonstration       PT Short Term Goals - 02/22/18 1415      PT SHORT TERM GOAL #1   Title  pt to be i with inital HEP     Time  3    Period  Weeks    Status  New    Target Date  03/15/18        PT Long Term Goals - 02/22/18 1503      PT LONG TERM GOAL #1   Title  pt to increase knee extension to </= 3 degrees to promote functional mobility required for efficient gait pattern    Time  6    Period  Weeks    Status  New    Target Date  04/05/18      PT LONG TERM GOAL #2   Title  pt to increase LLE strength to >/= 4+/5 in all planes to promote knee stability with walking/ standing and dynamic activities    Time  6    Period  Weeks    Status  New    Target Date  04/05/18      PT LONG TERM GOAL #3   Title  pt to be able to perfrom dynamic and plyometric activities with </= 1/10 pain for pt's goal of returning to soccer and  snowboarding    Time  6    Period  Weeks    Status  New    Target Date  04/05/18      PT LONG TERM GOAL #4   Title  increase FOTO score to </= 35% limited to demo improvement in function    Time  6    Period  Weeks    Status  New    Target Date  04/05/18      PT LONG TERM GOAL #5   Title  pt to be I with all HEP given as of last visit to maintain and progress function    Time  6    Period  Weeks    Status  New    Target Date  04/05/18            Plan - 02/27/18 1747    Clinical Impression Statement  Patient is 5 weeks post op this week,  She was able to procress to standing exercises without increasing her pain.   She was able to SLS 30 + seconds .  Care taken to follow Protocol.    PT Next Visit Plan  review/ update HEP, stretching hamstring/ quad, LLE strengthening, (protocol 3/21 pt is 5 weeks post op)    PT Home Exercise Plan  SLR with quad set, sidelying hip abduction, heel raise, bridge    Consulted and Agree with Plan of Care  Patient       Patient will benefit from skilled therapeutic intervention in order to improve the following deficits and impairments:     Visit Diagnosis: Chronic pain of left knee  Muscle weakness (generalized)  Acute pain of left knee  Other symptoms and signs involving the musculoskeletal system     Problem List Patient Active Problem List   Diagnosis Date Noted  .  Left knee injury, subsequent encounter 08/04/2017  . Opiate abuse, episodic (HCC) 10/31/2016  . 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 [place], [place], [address] Phone: [phone]   Fax:  [phone]  Name: Debbie Griffin MRN: [id number] Date of Birth: 02/24/1981

## 2018-02-28 ENCOUNTER — Encounter (INDEPENDENT_AMBULATORY_CARE_PROVIDER_SITE_OTHER): Payer: Self-pay | Admitting: Orthopedic Surgery

## 2018-02-28 ENCOUNTER — Ambulatory Visit (INDEPENDENT_AMBULATORY_CARE_PROVIDER_SITE_OTHER): Payer: Self-pay | Admitting: Orthopedic Surgery

## 2018-02-28 DIAGNOSIS — Z9889 Other specified postprocedural states: Secondary | ICD-10-CM

## 2018-02-28 NOTE — Progress Notes (Signed)
   Post-Op Visit Note   Patient: Debbie Griffin           Date of Birth: 02-Feb-1981           MRN: [id number] Visit Date: 02/28/2018 PCP: Patient, No Pcp Per   Assessment & Plan:  Chief Complaint:  Chief Complaint  Patient presents with  . Left Knee - Routine Post Op  . Right Knee - Follow-up   Visit Diagnoses:  1. S/P ACL reconstruction     Plan: Toni AmendCourtney is a 37 year old patient with 6 weeks out left knee ACL reconstruction.  She also is here to review right knee MRI scan.  The right knee MRI scan showed intact ACL and menisci.  A little bit of patella also.  She is doing well with her left knee.  On exam she has a little bit of crepitus with range of motion but is not painful.  Slight effusion.  She has excellent range of motion and very good graft stability.  Plan is to continue with physical therapy for another 6 weeks.  I will see her back in 6 weeks for clinical recheck.  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  Follow-Up Instructions: Return in about 6 weeks (around 04/11/2018).   Orders:  No orders of the defined types were placed in this encounter.  No orders of the defined types were placed in this encounter.   Imaging: No results found.  PMFS History: Patient Active Problem List   Diagnosis Date Noted  . Left knee injury, subsequent encounter 08/04/2017  . Opiate abuse, episodic (HCC) 10/31/2016  . 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 Anxiety   . Dislocation of the knee cap    left 2018  . Miscarriage   . Multiple gastric ulcers   . Ovarian cyst     Family History  Problem Relation Age of Onset  . Thyroid disease Father   . Other Neg Hx     Past Surgical History:  Procedure Laterality Date  . ANTERIOR CRUCIATE LIGAMENT REPAIR Left 01/12/2018   Procedure: LEFT KNEE ANTERIOR CRUCIATE LIGAMENT (ACL) RECONSTRUCTION, PARTIAL MEDIAL MENISCECTOMY;  Surgeon: Cammy Copaean, Kassie Keng Scott,  MD;  Location: [facility] OR;  Service: Orthopedics;  Laterality: Left;  . ESOPHAGOGASTRODUODENOSCOPY    . WISDOM TOOTH EXTRACTION     Social History   Occupational History  . Not on file  Tobacco Use  . Smoking status: Current Some Day Smoker    Types: Cigarettes  . Smokeless tobacco: Never Used  Substance and Sexual Activity  . Alcohol use: Yes    Comment: rarely  . Drug use: No    Comment: heroin (past)  . Sexual activity: Yes    Birth control/protection: None, IUD

## 2018-03-02 ENCOUNTER — Emergency Department (HOSPITAL_BASED_OUTPATIENT_CLINIC_OR_DEPARTMENT_OTHER): Payer: No Typology Code available for payment source

## 2018-03-02 ENCOUNTER — Other Ambulatory Visit: Payer: Self-pay

## 2018-03-02 ENCOUNTER — Emergency Department (HOSPITAL_BASED_OUTPATIENT_CLINIC_OR_DEPARTMENT_OTHER)
Admission: EM | Admit: 2018-03-02 | Discharge: 2018-03-02 | Disposition: A | Discharge status: Home / Self Care | Payer: No Typology Code available for payment source | Attending: Emergency Medicine | Admitting: Emergency Medicine

## 2018-03-02 ENCOUNTER — Encounter (HOSPITAL_BASED_OUTPATIENT_CLINIC_OR_DEPARTMENT_OTHER): Payer: Self-pay | Admitting: *Deleted

## 2018-03-02 DIAGNOSIS — F17210 Nicotine dependence, cigarettes, uncomplicated: Secondary | ICD-10-CM | POA: Insufficient documentation

## 2018-03-02 DIAGNOSIS — L040 Acute lymphadenitis of face, head and neck: Secondary | ICD-10-CM | POA: Insufficient documentation

## 2018-03-02 HISTORY — DX: Opioid abuse, uncomplicated: F11.10

## 2018-03-02 HISTORY — DX: Adjustment disorder with mixed disturbance of emotions and conduct: F43.25

## 2018-03-02 LAB — CBC WITH DIFFERENTIAL/PLATELET
BASOS ABS: 0 10*3/uL (ref 0.0–0.1)
Basophils Relative: 0 %
Eosinophils Absolute: 0.4 10*3/uL (ref 0.0–0.7)
Eosinophils Relative: 5 %
HEMATOCRIT: 36.3 % (ref 36.0–46.0)
Hemoglobin: 12.8 g/dL (ref 12.0–15.0)
LYMPHS PCT: 36 %
Lymphs Abs: 3 10*3/uL (ref 0.7–4.0)
MCH: 30.8 pg (ref 26.0–34.0)
MCHC: 35.3 g/dL (ref 30.0–36.0)
MCV: 87.5 fL (ref 78.0–100.0)
MONO ABS: 0.7 10*3/uL (ref 0.1–1.0)
Monocytes Relative: 8 %
NEUTROS ABS: 4.1 10*3/uL (ref 1.7–7.7)
Neutrophils Relative %: 51 %
Platelets: 270 10*3/uL (ref 150–400)
RBC: 4.15 MIL/uL (ref 3.87–5.11)
RDW: 11.3 % — AB (ref 11.5–15.5)
WBC: 8.2 10*3/uL (ref 4.0–10.5)

## 2018-03-02 LAB — BASIC METABOLIC PANEL
Anion gap: 9 (ref 5–15)
BUN: 14 mg/dL (ref 6–20)
CO2: 24 mmol/L (ref 22–32)
Calcium: 9.2 mg/dL (ref 8.9–10.3)
Chloride: 103 mmol/L (ref 101–111)
Creatinine, Ser: 0.9 mg/dL (ref 0.44–1.00)
GFR calc Af Amer: >60 mL/min (ref >60)
GLUCOSE: 98 mg/dL (ref 65–99)
POTASSIUM: 3.9 mmol/L (ref 3.5–5.1)
Sodium: 136 mmol/L (ref 135–145)

## 2018-03-02 LAB — PREGNANCY, URINE: Preg Test, Ur: NEGATIVE

## 2018-03-02 MED ORDER — AMOXICILLIN-POT CLAVULANATE 875-125 MG PO TABS: 1.0000 | ORAL_TABLET | Freq: Two times a day (BID) | ORAL | 0 refills | Status: DC

## 2018-03-02 MED ORDER — AMOXICILLIN-POT CLAVULANATE 875-125 MG PO TABS
1.0000 | ORAL_TABLET | Freq: Once | ORAL | Status: AC
  Administered 2018-03-02: 1 via ORAL
  Filled 2018-03-02: qty 1

## 2018-03-02 MED ORDER — IBUPROFEN 800 MG PO TABS
ORAL_TABLET | ORAL | Status: AC
  Filled 2018-03-02: qty 1

## 2018-03-02 MED ORDER — IBUPROFEN 800 MG PO TABS
800.0000 mg | ORAL_TABLET | Freq: Once | ORAL | Status: AC
  Administered 2018-03-02: 800 mg via ORAL

## 2018-03-02 MED ORDER — IOPAMIDOL (ISOVUE-300) INJECTION 61%
100.0000 mL | Freq: Once | INTRAVENOUS | Status: AC | PRN
  Administered 2018-03-02: 100 mL via INTRAVENOUS

## 2018-03-02 MED ORDER — DIAZEPAM 5 MG PO TABS: 5.0000 mg | ORAL_TABLET | Freq: Two times a day (BID) | ORAL | 0 refills | Status: DC

## 2018-03-02 MED ORDER — HYDROCODONE-ACETAMINOPHEN 5-325 MG PO TABS: 2.0000 | ORAL_TABLET | ORAL | 0 refills | Status: DC | PRN

## 2018-03-02 NOTE — ED Provider Notes (Signed)
[facility] EMERGENCY DEPARTMENT Provider Note   CSN: [id number] Arrival date & time: 03/02/18  1806     History   Chief Complaint Chief Complaint  Patient presents with  . Torticollis    HPI Debbie Griffin is a 37 y.o. female.  37 year old female complaining of few days of left-sided posterior ear pain with associated pain in her neck and headache.  She states there was a bump there and when she had asked to doctor take a look at it he wondered if it might of been an insect bite.  Since then the pains progressed.  She denies any fever.  She is never had this before.  Not associated with any ear problems.  No difficulty swallowing or speaking.  The history is provided by the patient.  Neck Injury  This is a new problem. The current episode started more than 2 days ago. The problem occurs constantly. The problem has not changed since onset.Associated symptoms include headaches. Pertinent negatives include no chest pain, no abdominal pain and no shortness of breath. The symptoms are aggravated by twisting and bending. Nothing relieves the symptoms. She has tried nothing for the symptoms. The treatment provided no relief.    Past Medical History:  Diagnosis Date  . Anxiety   . Dislocation of the knee cap    left 2018  . Miscarriage   . Multiple gastric ulcers   . Ovarian cyst     Patient Active Problem List   Diagnosis Date Noted  . Left knee injury, subsequent encounter 08/04/2017  . Opiate abuse, episodic (HCC) 10/31/2016  . 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 ANTERIOR CRUCIATE LIGAMENT REPAIR Left 01/12/2018   Procedure: LEFT KNEE ANTERIOR CRUCIATE LIGAMENT (ACL) RECONSTRUCTION, PARTIAL MEDIAL MENISCECTOMY;  Surgeon: Cammy Copa, MD;  Location: [facility] OR;  Service: Orthopedics;  Laterality: Left;  . ESOPHAGOGASTRODUODENOSCOPY    . WISDOM TOOTH EXTRACTION        OB History    Gravida  2   Para  1   Term  1   Preterm      AB  1   Living  1     SAB  1   TAB      Ectopic      Multiple      Live Births               Home Medications    Prior to Admission medications   Medication Sig Start Date End Date Taking? Authorizing Provider  ibuprofen (ADVIL,MOTRIN) 200 MG tablet Take 400-800 mg by mouth every 8 (eight) hours as needed (for pain.).   Yes [provider]  cyclobenzaprine (FLEXERIL) 10 MG tablet Take 1 tablet (10 mg total) by mouth 3 (three) times daily as needed for muscle spasms. 01/12/18   Adonis Huguenin, NP  traMADol (ULTRAM) 50 MG tablet Take 1 tablet (50 mg total) by mouth every 6 (six) hours as needed. 01/12/18   Adonis Huguenin, NP    Family History Family History  Problem Relation Age of Onset  . Thyroid disease Father   . Other Neg Hx     Social History Social History   Tobacco Use  . Smoking status: Current Some Day Smoker    Types: Cigarettes  . Smokeless tobacco: Never Used  Substance Use Topics  . Alcohol use: Yes    Comment: rarely  . Drug  use: No    Comment: heroin (past)     Allergies   Patient has no known allergies.   Review of Systems Review of Systems  Constitutional: Negative for fever.  HENT: Negative for sore throat.   Respiratory: Negative for shortness of breath.   Cardiovascular: Negative for chest pain.  Gastrointestinal: Negative for abdominal pain.  Genitourinary: Negative for dysuria.  Skin: Negative for rash.  Neurological: Positive for headaches.     Physical Exam Updated Vital Signs BP 119/84 (BP Location: Left Arm)   Temp 98.7 F (37.1 C) (Oral)   Resp 18   Ht 5\' 8"  (1.727 m)   Wt 72.6 kg (160 lb)   LMP 02/12/2018 (Approximate)   SpO2 100%   BMI 24.33 kg/m   Physical Exam  Constitutional: She appears well-developed and well-nourished. No distress.  HENT:  Head: Normocephalic and atraumatic.  Mouth/Throat: Oropharynx is clear and  moist.  Eyes: Conjunctivae are normal.  Neck: Neck supple.    Cardiovascular: Normal rate, regular rhythm and intact distal pulses.  No murmur heard. Pulmonary/Chest: Effort normal and breath sounds normal. No stridor. No respiratory distress. She has no wheezes.  Abdominal: Soft. There is no tenderness.  Musculoskeletal: Normal range of motion. She exhibits no edema or tenderness.  Neurological: She is alert. GCS eye subscore is 4. GCS verbal subscore is 5. GCS motor subscore is 6.  Skin: Skin is warm and dry.  Psychiatric: She has a normal mood and affect.  Nursing note and vitals reviewed.    ED Treatments / Results  Labs (all labs ordered are listed, but only abnormal results are displayed) Labs Reviewed  CBC WITH DIFFERENTIAL/PLATELET - Abnormal; Notable for the following components:      Result Value   RDW 11.3 (*)    All other components within normal limits  BASIC METABOLIC PANEL  PREGNANCY, URINE    EKG None  Radiology Ct Soft Tissue Neck W Contrast  Result Date: 03/02/2018 CLINICAL DATA:  Initial evaluation for acute swelling and pain overlying the left mastoid region. EXAM: CT NECK WITH CONTRAST TECHNIQUE: Multidetector CT imaging of the neck was performed using the standard protocol following the bolus administration of intravenous contrast. CONTRAST:  100mL ISOVUE-300 IOPAMIDOL (ISOVUE-300) INJECTION 61% COMPARISON:  None. FINDINGS: Pharynx and larynx: Oral cavity within normal limits without mass lesion or loculated fluid collection. Palatine tonsils symmetric and normal. Parapharyngeal fat preserved. Nasopharynx within normal limits. Retropharyngeal soft tissues normal. Epiglottis within normal limits. Vallecula largely clear. Minimal layering secretions noted within the hypopharynx. Hypopharynx and supraglottic larynx otherwise within normal limits. True cords apposed and not well evaluated. Subglottic airway clear. Salivary glands: Salivary glands including the  parotid and submandibular glands are within normal limits. Few subcentimeter intraparotid lymph nodes noted on the left. Thyroid: Thyroid normal. Lymph nodes: There is hazy soft tissue stranding with swelling within the left postauricular region, overlying the left mastoid prominence. There is an underlying left postauricular/level 5 lymph node measuring up to 11 mm (series 3, image 38). No demonstrates hazy/ill-defined margins with hypodensity consistent with necrosis. Few additional mildly prominent shotty subcentimeter nodes present within this region. Finding is indeterminate, but suspected to be either infectious or inflammatory in nature given the rapid onset of symptoms. No other pathologically enlarged lymph nodes identified within the neck. Vascular: Normal intravascular enhancement seen throughout the neck. Limited intracranial: Unremarkable. Visualized orbits: Globes and orbital soft tissues within normal limits. Mastoids and visualized paranasal sinuses: Mild scattered mucosal thickening within the ethmoidal  air cells and maxillary sinuses. Few scattered small superimposed retention cyst noted. Paranasal sinuses are otherwise clear. Mastoid air cells and middle ear cavities are clear. Skeleton: No acute osseous abnormality. No worrisome lytic or blastic osseous lesions. Moderate degenerate spondylolysis noted at C5-6 and C6-7. Upper chest: Visualized upper chest within normal limits. Partially visualized lungs are clear. Other: None. IMPRESSION: 1. Mildly enlarged 11 mm necrotic left postauricular/posterior cervical lymph node with associated localized inflammatory changes and swelling within the left postauricular region. Findings are nonspecific, but suspected to be either infectious or inflammatory in nature given the rapidity of onset. Clinical follow-up to resolution is recommended, as underlying malignancy as always is not entirely excluded. 2. No other acute abnormality identified within the neck.  Electronically Signed   By: Rise Mu M.D.   On: 03/02/2018 23:20    Procedures Procedures (including critical care time)  Medications Ordered in ED Medications - No data to display   Initial Impression / Assessment and Plan / ED Course  I have reviewed the triage vital signs and the nursing notes.  Pertinent labs & imaging results that were available during my care of the patient were reviewed by me and considered in my medical decision making (see chart for details).      Final Clinical Impressions(s) / ED Diagnoses   Final diagnoses:  Acute cervical adenitis    ED Discharge Orders        Ordered    amoxicillin-clavulanate (AUGMENTIN) 875-125 MG tablet  Every 12 hours     03/02/18 2329    diazepam (VALIUM) 5 MG tablet  2 times daily     03/02/18 2329    HYDROcodone-acetaminophen (NORCO/VICODIN) 5-325 MG tablet  Every 4 hours PRN     03/02/18 2329       Terrilee Files, MD 03/03/18 1739

## 2018-03-02 NOTE — Discharge Instructions (Addendum)
Your evaluated in the emergency department for left-sided neck pain.  Your CAT scan showed that you had an inflamed lymph node.  We are starting you some antibiotics.  You will need to finish these and continue to use warm compress to the area.  We giving her a short course of some pain medicine and some spasm medicine.  These medications may be sedating so please do not drink or drive when using.  You should follow-up with your regular doctor and return if any worsening symptoms.  If the if the pain and swelling do not resolve you should have this rechecked.

## 2018-03-02 NOTE — ED Triage Notes (Signed)
Pt has a knot on her neck, orthopedic surgeon told her that it is a possible insect bite, if it gets worse to come to ER. Pt stated that it is worse and she cant turn her neck from side to side. States that it is throbbing.

## 2018-03-06 ENCOUNTER — Encounter: Payer: Self-pay | Admitting: Physical Therapy

## 2018-03-06 ENCOUNTER — Ambulatory Visit: Payer: Self-pay | Attending: Family Medicine | Admitting: Physical Therapy

## 2018-03-06 DIAGNOSIS — M25562 Pain in left knee: Secondary | ICD-10-CM | POA: Insufficient documentation

## 2018-03-06 DIAGNOSIS — G8929 Other chronic pain: Secondary | ICD-10-CM | POA: Insufficient documentation

## 2018-03-06 DIAGNOSIS — R29898 Other symptoms and signs involving the musculoskeletal system: Secondary | ICD-10-CM | POA: Insufficient documentation

## 2018-03-06 DIAGNOSIS — M6281 Muscle weakness (generalized): Secondary | ICD-10-CM | POA: Insufficient documentation

## 2018-03-06 NOTE — Therapy (Signed)
[facility] [address] [place], [place], [address] Phone: [phone]   Fax:  [phone]  Physical Therapy Treatment  Patient Details  Name: Burna SisCourtney Lazaro MRN: [id number] Date of Birth: [birth year] Referring Provider: Cammy Copaean, Gregory Scott, MD   Encounter Date: 03/06/2018  PT End of Session - 03/06/18 0744    Visit Number  3    Number of Visits  13    Date for PT Re-Evaluation  04/05/18    PT Start Time  0740 pt arrived 10 min late    PT Stop Time  0803    PT Time Calculation (min)  23 min    Activity Tolerance  Patient tolerated treatment well    Behavior During Therapy  [facility] for tasks assessed/performed       Past Medical History:  Diagnosis Date  . Adjustment disorder with mixed disturbance of emotions and conduct   . Anxiety   . Dislocation of the knee cap    left 2018  . Miscarriage   . Multiple gastric ulcers   . Opiate abuse, continuous (HCC)   . Ovarian cyst     Past Surgical History:  Procedure Laterality Date  . ANTERIOR CRUCIATE LIGAMENT REPAIR Left 01/12/2018   Procedure: LEFT KNEE ANTERIOR CRUCIATE LIGAMENT (ACL) RECONSTRUCTION, PARTIAL MEDIAL MENISCECTOMY;  Surgeon: Cammy Copaean, Gregory Scott, MD;  Location: [facility] OR;  Service: Orthopedics;  Laterality: Left;  . ESOPHAGOGASTRODUODENOSCOPY    . WISDOM TOOTH EXTRACTION      There were no vitals filed for this visit.  Subjective Assessment - 03/06/18 0741    Subjective  " I wasn't feeling to good last week and my neck was really sore. My knee has been doing pretty good"     Currently in Pain?  Yes    Pain Score  2     Pain Orientation  Left    Aggravating Factors   weather related    Pain Relieving Factors  ice, elevation,                        [facility] Adult PT Treatment/Exercise - 03/06/18 0743      Knee/Hip Exercises: Stretches   Active Hamstring Stretch  1 rep;Left;30 seconds    Quad Stretch  Left;1 rep;30 seconds    Gastroc Stretch  30  seconds;1 rep;Left slant       Knee/Hip Exercises: Aerobic   Recumbent Bike  L3 x 5 min      Knee/Hip Exercises: Standing   Other Standing Knee Exercises  wall squat with sit 1 x 10 holding 10 sec in squat position    Other Standing Knee Exercises  lateral band walks with x technique 4 x 10 ft with green band      Knee/Hip Exercises: Seated   Stool Scoot - Round Trips  4 x 50 ft                PT Short Term Goals - 02/22/18 1415      PT SHORT TERM GOAL #1   Title  pt to be i with inital HEP     Time  3    Period  Weeks    Status  New    Target Date  03/15/18        PT Long Term Goals - 02/22/18 1503      PT LONG TERM GOAL #1   Title  pt to increase knee extension to </= 3 degrees to promote  functional mobility required for efficient gait pattern    Time  6    Period  Weeks    Status  New    Target Date  04/05/18      PT LONG TERM GOAL #2   Title  pt to increase LLE strength to >/= 4+/5 in all planes to promote knee stability with walking/ standing and dynamic activities    Time  6    Period  Weeks    Status  New    Target Date  04/05/18      PT LONG TERM GOAL #3   Title  pt to be able to perfrom dynamic and plyometric activities with </= 1/10 pain for pt's goal of returning to soccer and snowboarding    Time  6    Period  Weeks    Status  New    Target Date  04/05/18      PT LONG TERM GOAL #4   Title  increase FOTO score to </= 35% limited to demo improvement in function    Time  6    Period  Weeks    Status  New    Target Date  04/05/18      PT LONG TERM GOAL #5   Title  pt to be I with all HEP given as of last visit to maintain and progress function    Time  6    Period  Weeks    Status  New    Target Date  04/05/18            Plan - 03/06/18 0753    Clinical Impression Statement  pt arrived 10 min late today, shortened session due to shortened session and pt running late. continued working on stretching the hamstrings and quads  continued hip / knee strengthening. she reported soreness in the knee with stool scoots. continued with week 7 protocol. end of session she reported soreness but no increase in pain.     PT Next Visit Plan  review/ update HEP, stretching hamstring/ quad, LLE strengthening, (protocol 7 weeks post op)    PT Home Exercise Plan  SLR with quad set, sidelying hip abduction, heel raise, bridge    Consulted and Agree with Plan of Care  Patient       Patient will benefit from skilled therapeutic intervention in order to improve the following deficits and impairments:  Decreased activity tolerance, Decreased mobility, Decreased strength, Pain, Increased edema, Decreased endurance  Visit Diagnosis: Chronic pain of left knee  Acute pain of left knee  Muscle weakness (generalized)     Problem List Patient Active Problem List   Diagnosis Date Noted  . Left knee injury, subsequent encounter 08/04/2017  . Opiate abuse, episodic (HCC) 10/31/2016  . 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 [place], [place], [address] Phone: [phone]   Fax:  [phone]  Name: Lauree Yurick MRN: [id number] Date of Birth: 1981-08-17

## 2018-03-08 ENCOUNTER — Ambulatory Visit: Payer: Self-pay | Admitting: Physical Therapy

## 2018-03-13 ENCOUNTER — Ambulatory Visit: Payer: Self-pay | Admitting: Physical Therapy

## 2018-03-15 ENCOUNTER — Ambulatory Visit: Payer: Self-pay | Admitting: Physical Therapy

## 2018-03-20 ENCOUNTER — Encounter: Payer: Self-pay | Admitting: Physical Therapy

## 2018-03-20 ENCOUNTER — Ambulatory Visit: Payer: Self-pay | Admitting: Physical Therapy

## 2018-03-20 DIAGNOSIS — M6281 Muscle weakness (generalized): Secondary | ICD-10-CM

## 2018-03-20 DIAGNOSIS — M25562 Pain in left knee: Secondary | ICD-10-CM

## 2018-03-20 DIAGNOSIS — G8929 Other chronic pain: Secondary | ICD-10-CM

## 2018-03-20 DIAGNOSIS — R29898 Other symptoms and signs involving the musculoskeletal system: Secondary | ICD-10-CM

## 2018-03-20 NOTE — Therapy (Signed)
[facility] [address] [place], [place], [address] Phone: [phone]   Fax:  [phone]  Physical Therapy Treatment  Patient Details  Name: Debbie Griffin MRN: [id number] Date of Birth: 1981-11-14 Referring Provider: Cammy Copa, MD   Encounter Date: 03/20/2018  PT End of Session - 03/20/18 1644    Visit Number  4    Number of Visits  13    Date for PT Re-Evaluation  04/05/18    Authorization Type  CAFA    PT Start Time  0440 10 min late    PT Stop Time  0515    PT Time Calculation (min)  35 min       Past Medical History:  Diagnosis Date  . Adjustment disorder with mixed disturbance of emotions and conduct   . Anxiety   . Dislocation of the knee cap    left 2018  . Miscarriage   . Multiple gastric ulcers   . Opiate abuse, continuous (HCC)   . Ovarian cyst     Past Surgical History:  Procedure Laterality Date  . ANTERIOR CRUCIATE LIGAMENT REPAIR Left 01/12/2018   Procedure: LEFT KNEE ANTERIOR CRUCIATE LIGAMENT (ACL) RECONSTRUCTION, PARTIAL MEDIAL MENISCECTOMY;  Surgeon: Cammy Copa, MD;  Location: [facility] OR;  Service: Orthopedics;  Laterality: Left;  . ESOPHAGOGASTRODUODENOSCOPY    . WISDOM TOOTH EXTRACTION      There were no vitals filed for this visit.  Subjective Assessment - 03/20/18 1644    Subjective  Doing well, 2/10 inside/posterior knee                        [facility] Adult PT Treatment/Exercise - 03/20/18 0001      Knee/Hip Exercises: Aerobic   Elliptical  L1 Ramp 1 x 5 min      Knee/Hip Exercises: Machines for Strengthening   Cybex Leg Press  Left single leg 1 plate       Knee/Hip Exercises: Standing   Lateral Step Up  10 reps;Step Height: 6"    Forward Step Up  Hand Hold: 1;Step Height: 6"    Wall Squat  --    SLS  SLS with red band 3 way hip opposite LE     Rebounder  15 tosses red ball in SLS     Other Standing Knee Exercises  wall squat with sit 1 x 10 holding  10 sec in squat position    Other Standing Knee Exercises  doorway lunges       Knee/Hip Exercises: Seated   Stool Scoot - Round Trips  --               PT Short Term Goals - 02/22/18 1415      PT SHORT TERM GOAL #1   Title  pt to be i with inital HEP     Time  3    Period  Weeks    Status  New    Target Date  03/15/18        PT Long Term Goals - 02/22/18 1503      PT LONG TERM GOAL #1   Title  pt to increase knee extension to </= 3 degrees to promote functional mobility required for efficient gait pattern    Time  6    Period  Weeks    Status  New    Target Date  04/05/18      PT LONG TERM GOAL #2   Title  pt to increase LLE strength to >/= 4+/5 in all planes to promote knee stability with walking/ standing and dynamic activities    Time  6    Period  Weeks    Status  New    Target Date  04/05/18      PT LONG TERM GOAL #3   Title  pt to be able to perfrom dynamic and plyometric activities with </= 1/10 pain for pt's goal of returning to soccer and snowboarding    Time  6    Period  Weeks    Status  New    Target Date  04/05/18      PT LONG TERM GOAL #4   Title  increase FOTO score to </= 35% limited to demo improvement in function    Time  6    Period  Weeks    Status  New    Target Date  04/05/18      PT LONG TERM GOAL #5   Title  pt to be I with all HEP given as of last visit to maintain and progress function    Time  6    Period  Weeks    Status  New    Target Date  04/05/18            Plan - 03/20/18 1713    Clinical Impression Statement  Pt arrives 10 minutes late. She reports she called when she missed last week however we have no record. She will schedule more appointments. Progressed therex to lunges and SLS with 3 way resisted hip motions and added to HEP. Poor motor control noted with quad exercises. AROM imrpoved. Pt motivated.     PT Next Visit Plan  review/ update HEP, stretching hamstring/ quad, LLE strengthening, (protocol 8  weeks post op)    PT Home Exercise Plan  SLR with quad set, sidelying hip abduction, heel raise, bridge, SLS with resisted hip theraband, lunges     Consulted and Agree with Plan of Care  Patient       Patient will benefit from skilled therapeutic intervention in order to improve the following deficits and impairments:  Decreased activity tolerance, Decreased mobility, Decreased strength, Pain, Increased edema, Decreased endurance  Visit Diagnosis: Chronic pain of left knee  Other symptoms and signs involving the musculoskeletal system  Muscle weakness (generalized)  Acute pain of left knee     Problem List Patient Active Problem List   Diagnosis Date Noted  . Left knee injury, subsequent encounter 08/04/2017  . Opiate abuse, episodic (HCC) 10/31/2016  . 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

## 2018-03-22 ENCOUNTER — Telehealth: Payer: Self-pay | Admitting: Physical Therapy

## 2018-03-22 ENCOUNTER — Ambulatory Visit: Payer: Self-pay | Admitting: Physical Therapy

## 2018-03-22 NOTE — Telephone Encounter (Signed)
Spoke to patient regarding no-show to appointment today. She states she had the appointment time wrong for today and plans to attend her next appointment.

## 2018-03-26 ENCOUNTER — Encounter: Payer: Self-pay | Admitting: Physical Therapy

## 2018-03-26 ENCOUNTER — Ambulatory Visit: Payer: Self-pay | Admitting: Physical Therapy

## 2018-03-26 DIAGNOSIS — R29898 Other symptoms and signs involving the musculoskeletal system: Secondary | ICD-10-CM

## 2018-03-26 DIAGNOSIS — M25562 Pain in left knee: Secondary | ICD-10-CM

## 2018-03-26 DIAGNOSIS — M6281 Muscle weakness (generalized): Secondary | ICD-10-CM

## 2018-03-26 DIAGNOSIS — G8929 Other chronic pain: Secondary | ICD-10-CM

## 2018-03-26 NOTE — Therapy (Signed)
[facility] Outpatient Rehabilitation [facility] [address] [place], [place], [address] Phone: [phone]   Fax:  [phone]  Physical Therapy Treatment  Patient Details  Name: Debbie Griffin MRN: [id number] Date of Birth: [birth year] Referring Provider: Cammy Copa, MD   Encounter Date: 03/26/2018  PT End of Session - 03/26/18 1603    Visit Number  5    Number of Visits  13    Date for PT Re-Evaluation  04/05/18    PT Start Time  1420    PT Stop Time  1500    PT Time Calculation (min)  40 min    Activity Tolerance  Patient tolerated treatment well    Behavior During Therapy  [facility] for tasks assessed/performed       Past Medical History:  Diagnosis Date  . Adjustment disorder with mixed disturbance of emotions and conduct   . Anxiety   . Dislocation of the knee cap    left 2018  . Miscarriage   . Multiple gastric ulcers   . Opiate abuse, continuous (HCC)   . Ovarian cyst     Past Surgical History:  Procedure Laterality Date  . ANTERIOR CRUCIATE LIGAMENT REPAIR Left 01/12/2018   Procedure: LEFT KNEE ANTERIOR CRUCIATE LIGAMENT (ACL) RECONSTRUCTION, PARTIAL MEDIAL MENISCECTOMY;  Surgeon: Cammy Copa, MD;  Location: [facility] OR;  Service: Orthopedics;  Laterality: Left;  . ESOPHAGOGASTRODUODENOSCOPY    . WISDOM TOOTH EXTRACTION      There were no vitals filed for this visit.  Subjective Assessment - 03/26/18 1425    Subjective  Ran across parking lot and has a little burning anterior knee.   I got off work late and  had to run.  i exercise 30 minutes a day.      Currently in Pain?  Yes    Pain Score  1     Pain Location  Knee    Pain Orientation  Medial;Anterior;Left    Pain Descriptors / Indicators  Burning;Aching;Tightness    Pain Frequency  Intermittent    Aggravating Factors   stretching    Pain Relieving Factors  ice as needed    Multiple Pain Sites  -- right knee  intermittant going down stairs         [facility] PT Assessment -  03/26/18 0001      AROM   Left Knee Flexion  130                   [facility] Adult PT Treatment/Exercise - 03/26/18 0001      Knee/Hip Exercises: Stretches   Passive Hamstring Stretch  3 reps;30 seconds    Gastroc Stretch  3 reps;30 seconds incline board.      Knee/Hip Exercises: Aerobic   Recumbent Bike  L3 5 minutes      Knee/Hip Exercises: Standing   Heel Raises  10 reps;2 sets increased medial knee pain  4/10    sharp pain     Lateral Step Up  10 reps;Hand Hold: 1;Step Height: 6";Limitations    Lateral Step Up Limitations  needed cues forhip position ,  position imoroved    Forward Step Up  Left;1 set;10 reps;Hand Hold: 0;Step Height: 6" crepitus audible    Functional Squat  5 sets 2 steps    SLS  SLS with red band 3 way hip opposite LE     Rebounder  20 tosses red ball in SLS  1 set on pod  able to do with rest foot on  floor.     Other Standing Knee Exercises  wall squat with sit 1 x 10 holding 10 sec in squat position  first 5.  good hip position             PT Education - 03/26/18 1602    Education provided  Yes    Education Details  Exercise form    Person(s) Educated  Patient    Methods  Explanation;Demonstration;Verbal cues    Comprehension  Verbalized understanding;Returned demonstration       PT Short Term Goals - 03/26/18 1607      PT SHORT TERM GOAL #1   Title  pt to be i with inital HEP     Baseline  Independent with most HEP  so far    Time  3    Period  Weeks    Status  On-going        PT Long Term Goals - 02/22/18 1503      PT LONG TERM GOAL #1   Title  pt to increase knee extension to </= 3 degrees to promote functional mobility required for efficient gait pattern    Time  6    Period  Weeks    Status  New    Target Date  04/05/18      PT LONG TERM GOAL #2   Title  pt to increase LLE strength to >/= 4+/5 in all planes to promote knee stability with walking/ standing and dynamic activities    Time  6    Period  Weeks     Status  New    Target Date  04/05/18      PT LONG TERM GOAL #3   Title  pt to be able to perfrom dynamic and plyometric activities with </= 1/10 pain for pt's goal of returning to soccer and snowboarding    Time  6    Period  Weeks    Status  New    Target Date  04/05/18      PT LONG TERM GOAL #4   Title  increase FOTO score to </= 35% limited to demo improvement in function    Time  6    Period  Weeks    Status  New    Target Date  04/05/18      PT LONG TERM GOAL #5   Title  pt to be I with all HEP given as of last visit to maintain and progress function    Time  6    Period  Weeks    Status  New    Target Date  04/05/18            Plan - 03/26/18 1603    Clinical Impression Statement  Strengthening continued.  Cued for hip position and awareness.  Control of quades with eccentric exercises continues to be a challange.  She has in the past strengthened with playing and now she is unable to play.  130 knee flexion  AROM.Mild knee pain noted at end of session.  patient was unable to stay for ice due to needing to get back to work.     PT Next Visit Plan  review/ update HEP, stretching hamstring/ quad, LLE strengthening, (protocol 8 weeks post op)    PT Home Exercise Plan  SLR with quad set, sidelying hip abduction, heel raise, bridge, SLS with resisted hip theraband, lunges     Consulted and Agree with Plan of Care  Patient  Patient will benefit from skilled therapeutic intervention in order to improve the following deficits and impairments:     Visit Diagnosis: Chronic pain of left knee  Other symptoms and signs involving the musculoskeletal system  Muscle weakness (generalized)  Acute pain of left knee     Problem List Patient Active Problem List   Diagnosis Date Noted  . Left knee injury, subsequent encounter 08/04/2017  . Opiate abuse, episodic (HCC) 10/31/2016  . Adjustment disorder with mixed disturbance of emotions and conduct 10/31/2016     [facility]  PTA 03/26/2018, 4:09 PM  [facility] [address] [place], [place], [address] Phone: [phone]   Fax:  [phone]  Name: Debbie Griffin MRN: [id number] Date of Birth: Jan 11, 1981

## 2018-03-27 ENCOUNTER — Encounter: Payer: Self-pay | Admitting: Physical Therapy

## 2018-03-29 ENCOUNTER — Encounter: Payer: Self-pay | Admitting: Physical Therapy

## 2018-03-30 ENCOUNTER — Encounter

## 2018-03-30 ENCOUNTER — Ambulatory Visit: Payer: Self-pay | Admitting: Physical Therapy

## 2018-03-30 DIAGNOSIS — M6281 Muscle weakness (generalized): Secondary | ICD-10-CM

## 2018-03-30 DIAGNOSIS — M25562 Pain in left knee: Secondary | ICD-10-CM

## 2018-03-30 DIAGNOSIS — G8929 Other chronic pain: Secondary | ICD-10-CM

## 2018-03-30 DIAGNOSIS — R29898 Other symptoms and signs involving the musculoskeletal system: Secondary | ICD-10-CM

## 2018-03-30 NOTE — Therapy (Signed)
[facility] Outpatient Rehabilitation [facility] [address] [place], [place], [address] Phone: [phone]   Fax:  [phone]  Physical Therapy Treatment  Patient Details  Name: Debbie Griffin MRN: [id number] Date of Birth: [birth year] Referring Provider: Cammy Copa, MD   Encounter Date: 03/30/2018  PT End of Session - 03/30/18 1148    Visit Number  6    Number of Visits  13    Date for PT Re-Evaluation  04/05/18    Authorization Type  CAFA    PT Start Time  1100    PT Stop Time  1146    PT Time Calculation (min)  46 min       Past Medical History:  Diagnosis Date  . Adjustment disorder with mixed disturbance of emotions and conduct   . Anxiety   . Dislocation of the knee cap    left 2018  . Miscarriage   . Multiple gastric ulcers   . Opiate abuse, continuous (HCC)   . Ovarian cyst     Past Surgical History:  Procedure Laterality Date  . ANTERIOR CRUCIATE LIGAMENT REPAIR Left 01/12/2018   Procedure: LEFT KNEE ANTERIOR CRUCIATE LIGAMENT (ACL) RECONSTRUCTION, PARTIAL MEDIAL MENISCECTOMY;  Surgeon: Cammy Copa, MD;  Location: [facility] OR;  Service: Orthopedics;  Laterality: Left;  . ESOPHAGOGASTRODUODENOSCOPY    . WISDOM TOOTH EXTRACTION      There were no vitals filed for this visit.  Subjective Assessment - 03/30/18 1100    Subjective  Patient states that she is not currently experiencing any knee pain but that she woke up with her knee pain at 3/10. Patient has been completing her HEP and states that it has been going well. She went for a 30 min jog 2 days ago and wore wedges to work yesterday.    Pertinent History  "pinched nerve in low back" per patient    How long can you sit comfortably?  45 -60 min    How long can you stand comfortably?  unlimited    How long can you walk comfortably?  unlimited    Diagnostic tests  MRI    Patient Stated Goals  return to soccer and all activities    Pain Score  0-No pain                        [facility] Adult PT Treatment/Exercise - 03/30/18 0001      Knee/Hip Exercises: Stretches   Active Hamstring Stretch  3 reps;30 seconds    Gastroc Stretch  3 reps;30 seconds incline board.      Knee/Hip Exercises: Aerobic   Elliptical  L1 Ramp 1 x 3 min haulted due to pain increase on the medial side       Knee/Hip Exercises: Machines for Strengthening   Cybex Leg Press  Left single leg 1 plate 20 reps    Other Machine  cable walk 23lbs       Knee/Hip Exercises: Standing   Heel Raises  10 reps;3 sets increased medial knee pain  4/10    sharp pain     Lateral Step Up  10 reps;Hand Hold: 1;Step Height: 6";Limitations;2 sets    Forward Step Up  Left;10 reps;Hand Hold: 0;Step Height: 6";2 sets crepitus audible    Functional Squat  2 sets;10 reps 2 steps    Functional Squat Limitations  air-ex w/ 15lb kettle ball     SLS  SLS with green  band 3 way hip opposite LE  Rebounder  20 tosses red ball in SLS  on air ex    Other Standing Knee Exercises  forward step downs 2x10     Other Standing Knee Exercises  Lateral band walks 3x10 bilateral             PT Education - 03/30/18 1147    Education Details  exercise technique     Person(s) Educated  Patient    Methods  Explanation;Demonstration;Tactile cues;Verbal cues    Comprehension  Verbalized understanding;Returned demonstration       PT Short Term Goals - 03/26/18 1607      PT SHORT TERM GOAL #1   Title  pt to be i with inital HEP     Baseline  Independent with most HEP  so far    Time  3    Period  Weeks    Status  On-going        PT Long Term Goals - 02/22/18 1503      PT LONG TERM GOAL #1   Title  pt to increase knee extension to </= 3 degrees to promote functional mobility required for efficient gait pattern    Time  6    Period  Weeks    Status  New    Target Date  04/05/18      PT LONG TERM GOAL #2   Title  pt to increase LLE strength to >/= 4+/5 in all planes to promote  knee stability with walking/ standing and dynamic activities    Time  6    Period  Weeks    Status  New    Target Date  04/05/18      PT LONG TERM GOAL #3   Title  pt to be able to perfrom dynamic and plyometric activities with </= 1/10 pain for pt's goal of returning to soccer and snowboarding    Time  6    Period  Weeks    Status  New    Target Date  04/05/18      PT LONG TERM GOAL #4   Title  increase FOTO score to </= 35% limited to demo improvement in function    Time  6    Period  Weeks    Status  New    Target Date  04/05/18      PT LONG TERM GOAL #5   Title  pt to be I with all HEP given as of last visit to maintain and progress function    Time  6    Period  Weeks    Status  New    Target Date  04/05/18            Plan - 03/30/18 1149    Clinical Impression Statement  Therapy continued strengthening exercsies. Cable walks and forward step downs on a 4in step were added. SLS 3 way hip TB was increased to green. Air-ex pad was added to SLS and rebounder for stability strengthening. Therapy also added air-ex pad and 15lb kettle ball to pt's functional squat. Patient is progressing well and reports no increase in pain after therapy session. Therapy will continue to strengthen and work on stability.    Clinical Presentation  Stable    Clinical Decision Making  Low    Rehab Potential  Good    PT Frequency  2x / week    PT Duration  6 weeks    PT Treatment/Interventions  ADLs/Self Care Home Management;Cryotherapy;Electrical Stimulation;Iontophoresis 4mg /ml Dexamethasone;Moist Heat;Therapeutic exercise;Therapeutic  activities;Functional mobility training;Stair training;Gait training;Balance training;Ultrasound;Neuromuscular re-education;Patient/family education;Manual techniques;Vasopneumatic Device;Taping;Dry needling;Passive range of motion    PT Next Visit Plan  review/ update HEP, stretching hamstring/ quad, LLE strengthening, (protocol 8 weeks post op)    PT Home  Exercise Plan  SLR with quad set, sidelying hip abduction, heel raise, bridge, SLS with resisted hip theraband, lunges     Consulted and Agree with Plan of Care  Patient       Patient will benefit from skilled therapeutic intervention in order to improve the following deficits and impairments:  Decreased activity tolerance, Decreased mobility, Decreased strength, Pain, Increased edema, Decreased endurance  Visit Diagnosis: Chronic pain of left knee  Other symptoms and signs involving the musculoskeletal system  Muscle weakness (generalized)  Acute pain of left knee     Problem List Patient Active Problem List   Diagnosis Date Noted  . Left knee injury, subsequent encounter 08/04/2017  . Opiate abuse, episodic (HCC) 10/31/2016  . Adjustment disorder with mixed disturbance of emotions and conduct 10/31/2016    Dessie Coma PT DPT  03/30/2018, 12:29 PM  Elisabeth Pigeon PT DPT  03/30/2018  During this treatment session, the therapist was present, participating in and directing the treatment.   [facility] Outpatient Rehabilitation [facility] [address] [place], [place], [address] Phone: [phone]   Fax:  [phone]  Name: Debbie Griffin MRN: [id number] Date of Birth: 03/26/1981

## 2018-04-03 ENCOUNTER — Ambulatory Visit: Payer: Self-pay | Admitting: Physical Therapy

## 2018-04-03 ENCOUNTER — Encounter: Payer: Self-pay | Admitting: Physical Therapy

## 2018-04-04 ENCOUNTER — Ambulatory Visit: Payer: Self-pay | Attending: Family Medicine | Admitting: Physical Therapy

## 2018-04-04 ENCOUNTER — Encounter: Payer: Self-pay | Admitting: Physical Therapy

## 2018-04-04 DIAGNOSIS — M25562 Pain in left knee: Secondary | ICD-10-CM | POA: Insufficient documentation

## 2018-04-04 DIAGNOSIS — G8929 Other chronic pain: Secondary | ICD-10-CM | POA: Insufficient documentation

## 2018-04-04 DIAGNOSIS — R29898 Other symptoms and signs involving the musculoskeletal system: Secondary | ICD-10-CM | POA: Insufficient documentation

## 2018-04-04 DIAGNOSIS — M6281 Muscle weakness (generalized): Secondary | ICD-10-CM | POA: Insufficient documentation

## 2018-04-04 NOTE — Therapy (Signed)
[place], [place], [address] Phone: [phone]   Fax:  [phone]  Physical Therapy Treatment  Patient Details  Name: Debbie Griffin MRN: [id number] Date of Birth: 1981/05/01 Referring Provider: Meredith Pel, MD   Encounter Date: 04/04/2018  PT End of Session - 04/04/18 1554    Visit Number  7    Number of Visits  13    Date for PT Re-Evaluation  04/05/18    PT Start Time  1503    PT Stop Time  1543    PT Time Calculation (min)  40 min    Activity Tolerance  Patient tolerated treatment well    Behavior During Therapy  [facility] for tasks assessed/performed       Past Medical History:  Diagnosis Date  . Adjustment disorder with mixed disturbance of emotions and conduct   . Anxiety   . Dislocation of the knee cap    left 2018  . Miscarriage   . Multiple gastric ulcers   . Opiate abuse, continuous ([place])   . Ovarian cyst     Past Surgical History:  Procedure Laterality Date  . ANTERIOR CRUCIATE LIGAMENT REPAIR Left 01/12/2018   Procedure: LEFT KNEE ANTERIOR CRUCIATE LIGAMENT (ACL) RECONSTRUCTION, PARTIAL MEDIAL MENISCECTOMY;  Surgeon: Meredith Pel, MD;  Location: [place];  Service: Orthopedics;  Laterality: Left;  . ESOPHAGOGASTRODUODENOSCOPY    . WISDOM TOOTH EXTRACTION      There were no vitals filed for this visit.  Subjective Assessment - 04/04/18 1508    Subjective  No pain..  She is doing the exercises.  Knee feels better than her right.     Currently in Pain?  No/denies    Pain Location  Knee    Multiple Pain Sites  -- right knee  hurts with rain and from time to time  6-7/10                       [facility] Adult PT Treatment/Exercise - 04/04/18 0001      Knee/Hip Exercises: Stretches   Gastroc Stretch  3 reps;30 seconds incline      Knee/Hip Exercises: Aerobic   Recumbent Bike  L3-5 X 70mnutes      Knee/Hip Exercises: Machines for Strengthening   Cybex Leg  Press  Left single leg 1 plate 20 reps both 1 plate warm up.  then 2 plates single, sled 9, 10 X    Other Machine  cable walk 27 LBS forward and reverse      Knee/Hip Exercises: Standing   Heel Raises  3 sets;10 reps;Left    Lateral Step Up  10 reps;Hand Hold: 1;Step Height: 6";Limitations;2 sets mild  cramping meedial arch    Forward Step Up  Left;10 reps;Hand Hold: 0;Step Height: 6";2 sets crepitus less audible.    Functional Squat  2 sets;10 reps with 15 LBS on airex    Functional Squat Limitations  2 steps 10 sets    SLS  SLS with blue,  left knee cued to be slightly bent,   each side 3 way forward , abd, extension.       Knee/Hip Exercises: Supine   Single Leg Bridge  1 set;10 reps challanging      Knee/Hip Exercises: Sidelying   Clams  10 X each side,  blue band ,  cues             PT Education - 04/04/18 1553    Education  provided  Yes    Education Details  exercise technique    Person(s) Educated  Patient    Methods  Explanation;Verbal cues    Comprehension  Verbalized understanding;Returned demonstration       PT Short Term Goals - 04/04/18 1557      PT SHORT TERM GOAL #1   Title  pt to be i with inital HEP     Baseline  independ and adherent    Time  3    Period  Weeks    Status  Achieved        PT Long Term Goals - 02/22/18 1503      PT LONG TERM GOAL #1   Title  pt to increase knee extension to </= 3 degrees to promote functional mobility required for efficient gait pattern    Time  6    Period  Weeks    Status  New    Target Date  04/05/18      PT LONG TERM GOAL #2   Title  pt to increase LLE strength to >/= 4+/5 in all planes to promote knee stability with walking/ standing and dynamic activities    Time  6    Period  Weeks    Status  New    Target Date  04/05/18      PT LONG TERM GOAL #3   Title  pt to be able to perfrom dynamic and plyometric activities with </= 1/10 pain for pt's goal of returning to soccer and snowboarding    Time  6     Period  Weeks    Status  New    Target Date  04/05/18      PT LONG TERM GOAL #4   Title  increase FOTO score to </= 35% limited to demo improvement in function    Time  6    Period  Weeks    Status  New    Target Date  04/05/18      PT LONG TERM GOAL #5   Title  pt to be I with all HEP given as of last visit to maintain and progress function    Time  6    Period  Weeks    Status  New    Target Date  04/05/18            Plan - 04/04/18 1554    Clinical Impression Statement  Patient continued exercises as previous and was able to tolerate the progression to blue band and tolerated 2 plates single leg press without increase in pain.     Patient has improved in her functional strength as noted by the quality of exercises peformed.  STG#1 met. She had no pain at end of session and declined the need for modalities.     PT Next Visit Plan  review/ update HEP, stretching hamstring/ quad, LLE strengthening, (protocol 8 weeks post op)    PT Home Exercise Plan  SLR with quad set, sidelying hip abduction, heel raise, bridge, SLS with resisted hip theraband, lunges     Consulted and Agree with Plan of Care  Patient       Patient will benefit from skilled therapeutic intervention in order to improve the following deficits and impairments:     Visit Diagnosis: Chronic pain of left knee  Other symptoms and signs involving the musculoskeletal system  Muscle weakness (generalized)     Problem List Patient Active Problem List   Diagnosis Date Noted  .  Left knee injury, subsequent encounter 08/04/2017  . Opiate abuse, episodic ([place]) 10/31/2016  . Adjustment disorder with mixed disturbance of emotions and conduct 10/31/2016    [facility]  PTA 04/04/2018, 4:02 PM  [facility] [address] [place], [place], [address] Phone: [phone]   Fax:  [phone]  Name: Debbie Griffin MRN: [id number] Date of Birth: 19-Feb-1981

## 2018-04-05 ENCOUNTER — Ambulatory Visit: Payer: Self-pay | Admitting: Physical Therapy

## 2018-04-05 ENCOUNTER — Encounter: Payer: Self-pay | Admitting: Physical Therapy

## 2018-04-05 DIAGNOSIS — M25562 Pain in left knee: Principal | ICD-10-CM

## 2018-04-05 DIAGNOSIS — M6281 Muscle weakness (generalized): Secondary | ICD-10-CM

## 2018-04-05 DIAGNOSIS — R29898 Other symptoms and signs involving the musculoskeletal system: Secondary | ICD-10-CM

## 2018-04-05 DIAGNOSIS — G8929 Other chronic pain: Secondary | ICD-10-CM

## 2018-04-05 NOTE — Therapy (Addendum)
[place], [place], [address] Phone: [phone]   Fax:  [phone]  Physical Therapy Treatment / Re-certification / Discharge Summary  Patient Details  Name: Debbie Griffin MRN: [id number] Date of Birth: [birth year] Referring Provider: Meredith Pel, MD   Encounter Date: 04/05/2018  PT End of Session - 04/05/18 1149    Visit Number  8    Number of Visits  14    Date for PT Re-Evaluation  05/17/18    Authorization Type  CAFA    PT Start Time  1149    PT Stop Time  1233    PT Time Calculation (min)  44 min    Activity Tolerance  Patient tolerated treatment well    Behavior During Therapy  [facility] for tasks assessed/performed       Past Medical History:  Diagnosis Date  . Adjustment disorder with mixed disturbance of emotions and conduct   . Anxiety   . Dislocation of the knee cap    left 2018  . Miscarriage   . Multiple gastric ulcers   . Opiate abuse, continuous ([place])   . Ovarian cyst     Past Surgical History:  Procedure Laterality Date  . ANTERIOR CRUCIATE LIGAMENT REPAIR Left 01/12/2018   Procedure: LEFT KNEE ANTERIOR CRUCIATE LIGAMENT (ACL) RECONSTRUCTION, PARTIAL MEDIAL MENISCECTOMY;  Surgeon: Meredith Pel, MD;  Location: [facility];  Service: Orthopedics;  Laterality: Left;  . ESOPHAGOGASTRODUODENOSCOPY    . WISDOM TOOTH EXTRACTION      There were no vitals filed for this visit.  Subjective Assessment - 04/05/18 1149    Subjective  "no problems, no pain. I was alittle tight after last session"     Currently in Pain?  No/denies    Pain Score  0-No pain    Aggravating Factors   N/A         [facility] PT Assessment - 04/05/18 1155      Observation/Other Assessments   Focus on Therapeutic Outcomes (FOTO)   38% limited      AROM   Left Knee Extension  2    Left Knee Flexion  138      Strength   Left Knee Flexion  5/5    Left Knee Extension  5/5                    [facility] Adult PT Treatment/Exercise - 04/05/18 1159      Knee/Hip Exercises: Stretches   Active Hamstring Stretch  2 reps;30 seconds    Quad Stretch  2 reps;30 seconds standing with glute contraction    Gastroc Stretch  2 reps;30 seconds      Knee/Hip Exercises: Aerobic   Elliptical  L2 Ramp 1 x 15mn      Knee/Hip Exercises: Plyometrics   Other Plyometric Exercises  alternating toe taps on BOSU, 3 x 30 sec  cues to keep foot and knee aligned      Knee/Hip Exercises: Standing   Forward Lunges  2 sets;15 reps with medial pull of knee via green theraband for glute med    Other Standing Knee Exercises  [place] squat 2 x 10 with LLE    Other Standing Knee Exercises  Lateral band walks 3x10 bilateral with blue theraband      Knee/Hip Exercises: Seated   Other Seated Knee/Hip Exercises  kneeling russian eccentric hamstring curl 2 x 10 with bolster under chest      Knee/Hip Exercises: Supine  Bridges  2 sets;15 reps iwth heels on black foam roll    Bridges Limitations  second set with arms cross body             PT Education - 04/05/18 1255    Education provided  Yes    Education Details  reviewed previously provided HEP and progression of current POC    Person(s) Educated  Patient    Methods  Explanation;Verbal cues    Comprehension  Verbalized understanding;Verbal cues required       PT Short Term Goals - 04/04/18 1557      PT SHORT TERM GOAL #1   Title  pt to be i with inital HEP     Baseline  independ and adherent    Time  3    Period  Weeks    Status  Achieved        PT Long Term Goals - 04/05/18 1157      PT LONG TERM GOAL #1   Title  pt to increase knee extension to </= 3 degrees to promote functional mobility required for efficient gait pattern    Time  6    Period  Weeks    Status  Achieved      PT LONG TERM GOAL #2   Title  pt to increase LLE strength to >/= 4+/5 in all planes to promote knee stability with walking/  standing and dynamic activities    Time  6    Period  Weeks    Status  Achieved      PT LONG TERM GOAL #3   Title  pt to be able to perfrom dynamic and plyometric activities with </= 1/10 pain for pt's goal of returning to soccer and snowboarding    Time  6    Period  Weeks    Status  Unable to assess      PT LONG TERM GOAL #4   Title  increase FOTO score to </= 35% limited to demo improvement in function    Time  6    Period  Weeks    Status  On-going      PT LONG TERM GOAL #5   Title  pt to be I with all HEP given as of last visit to maintain and progress function    Time  6    Period  Weeks    Status  On-going            Plan - 04/05/18 1253    Clinical Impression Statement  Debbie Griffin continues to make progress with PT increasing knee mobility and strength. She is progressing appropriately  with goals, but does require intermittent cues to slow down.  continued with progressing with forward quick feet plyometric activity. continued strengthening of the hamstrings and quads. she would benefit from conitnued physical therapy to promote stability with high end dynamic activities and return to sport.     PT Frequency  2x / week    PT Duration  3 weeks    PT Treatment/Interventions  ADLs/Self Care Home Management;Cryotherapy;Electrical Stimulation;Iontophoresis '4mg'$ /ml Dexamethasone;Moist Heat;Therapeutic exercise;Therapeutic activities;Functional mobility training;Stair training;Gait training;Balance training;Ultrasound;Neuromuscular re-education;Patient/family education;Manual techniques;Vasopneumatic Device;Taping;Dry needling;Passive range of motion    PT Next Visit Plan  update HEP, stretching hamstring/ quad, LLE strengthening, forward plyometric activity, pre-jump training(protocol 9 weeks post op)    PT Home Exercise Plan  SLR with quad set, sidelying hip abduction, heel raise, bridge, SLS with resisted hip theraband, lunges     Consulted and  Agree with Plan of Care  Patient        Patient will benefit from skilled therapeutic intervention in order to improve the following deficits and impairments:  Decreased activity tolerance, Decreased mobility, Decreased strength, Pain, Increased edema, Decreased endurance  Visit Diagnosis: Chronic pain of left knee  Other symptoms and signs involving the musculoskeletal system  Muscle weakness (generalized)     Problem List Patient Active Problem List   Diagnosis Date Noted  . Left knee injury, subsequent encounter 08/04/2017  . Opiate abuse, episodic ([place]) 10/31/2016  . Adjustment disorder with mixed disturbance of emotions and conduct 10/31/2016   Starr Lake PT, DPT, LAT, ATC  04/05/18  12:57 PM      [facility] [address] [place], [place], [address] Phone: [phone]   Fax:  [phone]  Name: Debbie Griffin MRN: [id number] Date of Birth: [birth year]        PHYSICAL THERAPY DISCHARGE SUMMARY  Visits from Start of Care: 8  Current functional level related to goals / functional outcomes: See goals   Remaining deficits: unknown   Education / Equipment: HEP, theraband, posture  Plan: Patient agrees to discharge.  Patient goals were partially met. Patient is being discharged due to not returning since the last visit.  ?????         Kristoffer Leamon PT, DPT, LAT, ATC  06/06/18  9:30 AM

## 2018-04-06 ENCOUNTER — Encounter

## 2018-04-13 ENCOUNTER — Ambulatory Visit (INDEPENDENT_AMBULATORY_CARE_PROVIDER_SITE_OTHER): Payer: Self-pay | Admitting: Orthopedic Surgery

## 2018-04-13 ENCOUNTER — Encounter (INDEPENDENT_AMBULATORY_CARE_PROVIDER_SITE_OTHER): Payer: Self-pay | Admitting: Orthopedic Surgery

## 2018-04-13 DIAGNOSIS — Z9889 Other specified postprocedural states: Secondary | ICD-10-CM

## 2018-04-13 NOTE — Progress Notes (Signed)
   Post-Op Visit Note   Patient: Trianna Lupien           Date of Birth: 03-22-1981           MRN: [id number] Visit Date: 04/13/2018 PCP: Patient, No Pcp Per   Assessment & Plan:  Chief Complaint:  Chief Complaint  Patient presents with  . Left Knee - Follow-up   Visit Diagnoses: No diagnosis found.  Plan: Amar is a patient is now 3 months out left knee ACL reconstruction posterior medial meniscectomy.  She is doing well.  She is in therapy twice a week.  She has been doing straightahead running which I cautioned her against doing until her 54-month mark postop.  On examination she has excellent range of motion.  No effusion stable graft.  A little bit of tenderness to palpation around the lateral distal femur Endobutton site.  Plan at this time is for her to continue with leg strengthening exercises.  Elliptical okay.  Straightahead running okay in 4 weeks.  We will start some sport specific cutting and pivoting in 3 months from now. Follow-Up Instructions: Return in about 8 weeks (around 06/08/2018).   Orders:  No orders of the defined types were placed in this encounter.  No orders of the defined types were placed in this encounter.   Imaging: No results found.  PMFS History: Patient Active Problem List   Diagnosis Date Noted  . Left knee injury, subsequent encounter 08/04/2017  . Opiate abuse, episodic ([facility]) 10/31/2016  . 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 Adjustment disorder with mixed disturbance of emotions and conduct   . Anxiety   . Dislocation of the knee cap    left 2018  . Miscarriage   . Multiple gastric ulcers   . Opiate abuse, continuous ([facility])   . Ovarian cyst     Family History  Problem Relation Age of Onset  . Thyroid disease Father   . Other Neg Hx     Past Surgical History:  Procedure Laterality Date  . ANTERIOR CRUCIATE LIGAMENT REPAIR Left 01/12/2018   Procedure: LEFT KNEE ANTERIOR CRUCIATE LIGAMENT (ACL) RECONSTRUCTION, PARTIAL MEDIAL MENISCECTOMY;  Surgeon: Cammy Copa, MD;  [facility];  Service: Orthopedics;  Laterality: Left;  . ESOPHAGOGASTRODUODENOSCOPY    . WISDOM TOOTH EXTRACTION     Social History   Occupational History  . Not on file  Tobacco Use  . Smoking status: Current Some Day Smoker    Types: Cigarettes  . Smokeless tobacco: Never Used  Substance and Sexual Activity  . Alcohol use: Yes    Comment: rarely  . Drug use: No    Comment: heroin (past)  . Sexual activity: Yes    Birth control/protection: None, IUD

## 2018-05-11 ENCOUNTER — Encounter: Payer: Self-pay | Admitting: General Practice

## 2018-05-11 ENCOUNTER — Ambulatory Visit: Payer: Self-pay

## 2018-07-16 ENCOUNTER — Ambulatory Visit (INDEPENDENT_AMBULATORY_CARE_PROVIDER_SITE_OTHER): Payer: Self-pay | Admitting: Orthopedic Surgery

## 2018-07-18 ENCOUNTER — Ambulatory Visit (INDEPENDENT_AMBULATORY_CARE_PROVIDER_SITE_OTHER): Payer: Self-pay | Admitting: Orthopedic Surgery

## 2018-08-13 ENCOUNTER — Ambulatory Visit (INDEPENDENT_AMBULATORY_CARE_PROVIDER_SITE_OTHER): Payer: Self-pay | Admitting: Orthopedic Surgery

## 2018-12-26 ENCOUNTER — Telehealth (INDEPENDENT_AMBULATORY_CARE_PROVIDER_SITE_OTHER): Payer: Self-pay | Admitting: Orthopedic Surgery

## 2018-12-26 NOTE — Telephone Encounter (Signed)
Dr. August Saucer said that it is ok for the pt to have the brace. Paperwork is still at the desk I will bring it to you. Thanks!

## 2018-12-26 NOTE — Telephone Encounter (Signed)
Can you please see below and advise. Paperwork is complete and Eunice Blase has this at her desk will fax if in agreement and if not will shred. Thanks

## 2018-12-26 NOTE — Telephone Encounter (Signed)
Kipp Brood with [employer] is requesting a script for this patient.  She is a friend of Brent's and he had referred her to you.  She is need of an [id number] (double upright ligament/stability brace). Her last follow up was 08-13-18. She will be going SNOWBOARDING in a couple of weeks.  Kipp Brood had dispensed a patella tracking  Brace (months before her ACL tear), but he states the patient reached out to him to get the brace best suited after her repair.      If you have any questions, please call Kipp Brood  [phone]

## 2018-12-26 NOTE — Telephone Encounter (Signed)
Ok for brace thx

## 2019-01-22 ENCOUNTER — Other Ambulatory Visit: Payer: Self-pay | Admitting: Dermatology

## 2019-07-10 ENCOUNTER — Other Ambulatory Visit: Payer: Self-pay

## 2019-07-10 DIAGNOSIS — Z20822 Contact with and (suspected) exposure to covid-19: Secondary | ICD-10-CM

## 2019-07-11 LAB — NOVEL CORONAVIRUS, NAA: SARS-CoV-2, NAA: NOT DETECTED

## 2019-12-06 NOTE — L&D Delivery Note (Signed)
Pt was admitted and progressed quickly to C/C. She pushed 4 times and had a SVD of one live viable infant over an intact perineum in the ROA position. Placenta S/I. EBL -400cc. Baby to NBN. Right labial tear closed with 3-0 chromic.

## 2020-05-22 ENCOUNTER — Emergency Department (HOSPITAL_COMMUNITY): Payer: Medicaid Other

## 2020-05-22 ENCOUNTER — Observation Stay (HOSPITAL_COMMUNITY)
Admission: AD | Admit: 2020-05-22 | Discharge: 2020-05-23 | Disposition: A | Discharge status: Home / Self Care | Payer: Medicaid Other | Attending: Obstetrics and Gynecology | Admitting: Obstetrics and Gynecology

## 2020-05-22 ENCOUNTER — Encounter (HOSPITAL_COMMUNITY): Payer: Self-pay | Admitting: *Deleted

## 2020-05-22 ENCOUNTER — Other Ambulatory Visit: Payer: Self-pay

## 2020-05-22 DIAGNOSIS — K439 Ventral hernia without obstruction or gangrene: Secondary | ICD-10-CM | POA: Diagnosis present

## 2020-05-22 DIAGNOSIS — O26892 Other specified pregnancy related conditions, second trimester: Secondary | ICD-10-CM | POA: Diagnosis not present

## 2020-05-22 DIAGNOSIS — O99332 Smoking (tobacco) complicating pregnancy, second trimester: Secondary | ICD-10-CM | POA: Insufficient documentation

## 2020-05-22 DIAGNOSIS — F17210 Nicotine dependence, cigarettes, uncomplicated: Secondary | ICD-10-CM | POA: Diagnosis not present

## 2020-05-22 DIAGNOSIS — K429 Umbilical hernia without obstruction or gangrene: Secondary | ICD-10-CM | POA: Diagnosis not present

## 2020-05-22 DIAGNOSIS — O99612 Diseases of the digestive system complicating pregnancy, second trimester: Secondary | ICD-10-CM | POA: Insufficient documentation

## 2020-05-22 DIAGNOSIS — Z3A22 22 weeks gestation of pregnancy: Secondary | ICD-10-CM | POA: Diagnosis not present

## 2020-05-22 DIAGNOSIS — O09522 Supervision of elderly multigravida, second trimester: Secondary | ICD-10-CM | POA: Insufficient documentation

## 2020-05-22 DIAGNOSIS — Z20822 Contact with and (suspected) exposure to covid-19: Secondary | ICD-10-CM | POA: Diagnosis not present

## 2020-05-22 DIAGNOSIS — R1033 Periumbilical pain: Secondary | ICD-10-CM | POA: Diagnosis present

## 2020-05-22 LAB — COMPREHENSIVE METABOLIC PANEL
ALT: 12 U/L (ref 0–44)
AST: 15 U/L (ref 15–41)
Albumin: 3.6 g/dL (ref 3.5–5.0)
Alkaline Phosphatase: 51 U/L (ref 38–126)
Anion gap: 10 (ref 5–15)
BUN: 7 mg/dL (ref 6–20)
CO2: 22 mmol/L (ref 22–32)
Calcium: 8.7 mg/dL — ABNORMAL LOW (ref 8.9–10.3)
Chloride: 105 mmol/L (ref 98–111)
Creatinine, Ser: 0.68 mg/dL (ref 0.44–1.00)
GFR calc Af Amer: >60 mL/min (ref >60)
GFR calc non Af Amer: >60 mL/min (ref >60)
Glucose, Bld: 87 mg/dL (ref 70–99)
Potassium: 3.7 mmol/L (ref 3.5–5.1)
Sodium: 137 mmol/L (ref 135–145)
Total Bilirubin: 0.8 mg/dL (ref 0.3–1.2)
Total Protein: 6.2 g/dL — ABNORMAL LOW (ref 6.5–8.1)

## 2020-05-22 LAB — CBC
HCT: 37.1 % (ref 36.0–46.0)
Hemoglobin: 12.4 g/dL (ref 12.0–15.0)
MCH: 30.8 pg (ref 26.0–34.0)
MCHC: 33.4 g/dL (ref 30.0–36.0)
MCV: 92.3 fL (ref 80.0–100.0)
Platelets: 209 10*3/uL (ref 150–400)
RBC: 4.02 MIL/uL (ref 3.87–5.11)
RDW: 12.5 % (ref 11.5–15.5)
WBC: 9.3 10*3/uL (ref 4.0–10.5)
nRBC: 0 % (ref 0.0–0.2)

## 2020-05-22 LAB — URINALYSIS, ROUTINE W REFLEX MICROSCOPIC
Bilirubin Urine: NEGATIVE
Glucose, UA: NEGATIVE mg/dL
Hgb urine dipstick: NEGATIVE
Ketones, ur: NEGATIVE mg/dL
Leukocytes,Ua: NEGATIVE
Nitrite: NEGATIVE
Protein, ur: NEGATIVE mg/dL
Specific Gravity, Urine: 1.006 (ref 1.005–1.030)
pH: 6 (ref 5.0–8.0)

## 2020-05-22 LAB — TYPE AND SCREEN
ABO/RH(D): O NEG
Antibody Screen: NEGATIVE

## 2020-05-22 LAB — WET PREP, GENITAL
Clue Cells Wet Prep HPF POC: NONE SEEN
Sperm: NONE SEEN
Yeast Wet Prep HPF POC: NONE SEEN

## 2020-05-22 LAB — LIPASE, BLOOD: Lipase: 23 U/L (ref 11–51)

## 2020-05-22 LAB — I-STAT BETA HCG BLOOD, ED (MC, WL, AP ONLY): I-stat hCG, quantitative: >2000 m[IU]/mL — ABNORMAL HIGH (ref <5)

## 2020-05-22 LAB — ABO/RH: ABO/RH(D): O NEG

## 2020-05-22 MED ORDER — FENTANYL CITRATE (PF) 100 MCG/2ML IJ SOLN
50.0000 ug | Freq: Once | INTRAMUSCULAR | Status: AC
  Administered 2020-05-22: 50 ug via INTRAVENOUS
  Filled 2020-05-22: qty 2

## 2020-05-22 MED ORDER — PRENATAL MULTIVITAMIN CH
1.0000 | ORAL_TABLET | Freq: Every day | ORAL | Status: DC
  Administered 2020-05-23: 1 via ORAL
  Filled 2020-05-22: qty 1

## 2020-05-22 MED ORDER — LACTATED RINGERS IV SOLN: INTRAVENOUS | Status: DC

## 2020-05-22 MED ORDER — DOCUSATE SODIUM 100 MG PO CAPS
100.0000 mg | ORAL_CAPSULE | Freq: Every day | ORAL | Status: DC
  Administered 2020-05-23: 100 mg via ORAL
  Filled 2020-05-22: qty 1

## 2020-05-22 MED ORDER — SODIUM CHLORIDE 0.9 % IV BOLUS
1000.0000 mL | Freq: Once | INTRAVENOUS | Status: AC
  Administered 2020-05-22: 1000 mL via INTRAVENOUS

## 2020-05-22 MED ORDER — DIPHENHYDRAMINE HCL 12.5 MG/5ML PO ELIX
12.5000 mg | ORAL_SOLUTION | Freq: Four times a day (QID) | ORAL | Status: DC | PRN
  Filled 2020-05-22: qty 5

## 2020-05-22 MED ORDER — NALOXONE HCL 0.4 MG/ML IJ SOLN: 0.4000 mg | INTRAMUSCULAR | Status: DC | PRN

## 2020-05-22 MED ORDER — SODIUM CHLORIDE 0.9% FLUSH
3.0000 mL | Freq: Once | INTRAVENOUS | Status: AC
  Administered 2020-05-22: 3 mL via INTRAVENOUS

## 2020-05-22 MED ORDER — HYDROMORPHONE 1 MG/ML IV SOLN
INTRAVENOUS | Status: DC
  Administered 2020-05-22: 30 mg via INTRAVENOUS
  Administered 2020-05-23: 2.7 mg via INTRAVENOUS
  Filled 2020-05-22: qty 30

## 2020-05-22 MED ORDER — DIPHENHYDRAMINE HCL 50 MG/ML IJ SOLN: 12.5000 mg | Freq: Four times a day (QID) | INTRAMUSCULAR | Status: DC | PRN

## 2020-05-22 MED ORDER — ZOLPIDEM TARTRATE 5 MG PO TABS: 5.0000 mg | ORAL_TABLET | Freq: Every evening | ORAL | Status: DC | PRN

## 2020-05-22 MED ORDER — HYDROMORPHONE HCL 1 MG/ML IJ SOLN
1.0000 mg | Freq: Once | INTRAMUSCULAR | Status: AC
  Administered 2020-05-22: 1 mg via INTRAVENOUS
  Filled 2020-05-22: qty 1

## 2020-05-22 MED ORDER — ONDANSETRON HCL 4 MG/2ML IJ SOLN: 4.0000 mg | Freq: Four times a day (QID) | INTRAMUSCULAR | Status: DC | PRN

## 2020-05-22 MED ORDER — BUTORPHANOL TARTRATE 1 MG/ML IJ SOLN
1.0000 mg | Freq: Once | INTRAMUSCULAR | Status: AC
  Administered 2020-05-22: 1 mg via INTRAVENOUS
  Filled 2020-05-22: qty 1

## 2020-05-22 MED ORDER — CALCIUM CARBONATE ANTACID 500 MG PO CHEW: 2.0000 | CHEWABLE_TABLET | ORAL | Status: DC | PRN

## 2020-05-22 MED ORDER — SODIUM CHLORIDE 0.9% FLUSH: 9.0000 mL | INTRAVENOUS | Status: DC | PRN

## 2020-05-22 MED ORDER — ACETAMINOPHEN 325 MG PO TABS
650.0000 mg | ORAL_TABLET | ORAL | Status: DC | PRN
  Administered 2020-05-23: 650 mg via ORAL
  Filled 2020-05-22: qty 2

## 2020-05-22 NOTE — ED Notes (Signed)
Rapid response OB here.  Fetal HR 145 and strong.  OB MD called by rapid response.

## 2020-05-22 NOTE — ED Notes (Signed)
Reports given to MAU RN

## 2020-05-22 NOTE — MAU Provider Note (Signed)
History     CSN: [id number]  Arrival date and time: 05/22/20 1329   First Provider Initiated Contact with Patient 05/22/20 2111      Chief Complaint  Patient presents with  . Abdominal Pain   39 y.o. G3P1011 @22 .2 wks sent from ED with abdominal pain. Pt reports pain has been ongoing during the pregnancy but worsened today. She thinks the pain is d/t a hernia. She describes as constant, tearing pain surrounding her umbilicus. Rates pain 7/10. Had Fentanyl and Stadol in ED. She denies ctx, VB, LOF, or vaginal discharge. Last BM yesterday. Denies N/V recently, had a few weeks ago. Denies fevers.   OB History    Gravida  3   Para  1   Term  1   Preterm      AB  1   Living  1     SAB  1   TAB      Ectopic      Multiple      Live Births              Past Medical History:  Diagnosis Date  . Adjustment disorder with mixed disturbance of emotions and conduct   . Anxiety   . Dislocation of the knee cap    left 2018  . Miscarriage   . Multiple gastric ulcers   . Opiate abuse, continuous ([facility])   . Ovarian cyst     Past Surgical History:  Procedure Laterality Date  . ANTERIOR CRUCIATE LIGAMENT REPAIR Left 01/12/2018   Procedure: LEFT KNEE ANTERIOR CRUCIATE LIGAMENT (ACL) RECONSTRUCTION, PARTIAL MEDIAL MENISCECTOMY;  Surgeon: 03/12/2018, MD;  [facility];  Service: Orthopedics;  Laterality: Left;  . ESOPHAGOGASTRODUODENOSCOPY    . WISDOM TOOTH EXTRACTION      Family History  Problem Relation Age of Onset  . Thyroid disease Father   . Other Neg Hx     Social History   Tobacco Use  . Smoking status: Current Some Day Smoker    Types: Cigarettes  . Smokeless tobacco: Never Used  Vaping Use  . Vaping Use: Never used  Substance Use Topics  . Alcohol use: Yes    Comment: rarely  . Drug use: No    Comment: heroin (past)    Allergies: No Known Allergies  Medications Prior to Admission  Medication Sig Dispense Refill Last Dose  . Prenatal  Vit-Fe Fumarate-FA (PRENATAL VITAMIN) 27-0.8 MG TABS Take 1 tablet by mouth daily.   05/21/2020 at Unknown time    Review of Systems  Constitutional: Negative for fever.  Gastrointestinal: Positive for abdominal pain. Negative for constipation, diarrhea, nausea and vomiting.  Genitourinary: Negative for vaginal bleeding and vaginal discharge.   Physical Exam   Blood pressure (!) 105/53, pulse 69, temperature 98.5 F (36.9 C), temperature source Oral, resp. rate 19, height 5\' 8"  (1.727 m), weight 74.8 kg, last menstrual period 12/06/2019, SpO2 100 %.  Physical Exam  Nursing note and vitals reviewed. Constitutional: She is oriented to person, place, and time. She appears distressed.  Cardiovascular: Normal rate.  Respiratory: Effort normal. No respiratory distress.  GI: She exhibits no distension. There is abdominal tenderness in the periumbilical area. There is guarding. A hernia is present. Hernia confirmed positive in the umbilical area.  Genitourinary:    Genitourinary Comments: SVE: closed/long   Neurological: She is alert and oriented to person, place, and time.  Skin: Skin is warm and dry.  Psychiatric: Mood normal.  FHT: 150  Results  for orders placed or performed during the hospital encounter of 05/22/20 (from the past 24 hour(s))  Lipase, blood     Status: None   Collection Time: 05/22/20  1:53 PM  Result Value Ref Range   Lipase 23 11 - 51 U/L  Comprehensive metabolic panel     Status: Abnormal   Collection Time: 05/22/20  1:53 PM  Result Value Ref Range   Sodium 137 135 - 145 mmol/L   Potassium 3.7 3.5 - 5.1 mmol/L   Chloride 105 98 - 111 mmol/L   CO2 22 22 - 32 mmol/L   Glucose, Bld 87 70 - 99 mg/dL   BUN 7 6 - 20 mg/dL   Creatinine, Ser 0.68 0.44 - 1.00 mg/dL   Calcium 8.7 (L) 8.9 - 10.3 mg/dL   Total Protein 6.2 (L) 6.5 - 8.1 g/dL   Albumin 3.6 3.5 - 5.0 g/dL   AST 15 15 - 41 U/L   ALT 12 0 - 44 U/L   Alkaline Phosphatase 51 38 - 126 U/L   Total Bilirubin  0.8 0.3 - 1.2 mg/dL   GFR calc non Af Amer >60 >60 mL/min   GFR calc Af Amer >60 >60 mL/min   Anion gap 10 5 - 15  CBC     Status: None   Collection Time: 05/22/20  1:53 PM  Result Value Ref Range   WBC 9.3 4.0 - 10.5 K/uL   RBC 4.02 3.87 - 5.11 MIL/uL   Hemoglobin 12.4 12.0 - 15.0 g/dL   HCT 37.1 36 - 46 %   MCV 92.3 80.0 - 100.0 fL   MCH 30.8 26.0 - 34.0 pg   MCHC 33.4 30.0 - 36.0 g/dL   RDW 12.5 11.5 - 15.5 %   Platelets 209 150 - 400 K/uL   nRBC 0.0 0.0 - 0.2 %  I-Stat beta hCG blood, ED     Status: Abnormal   Collection Time: 05/22/20  2:02 PM  Result Value Ref Range   I-stat hCG, quantitative >2,000.0 (H) <5 mIU/mL   Comment 3          Urinalysis, Routine w reflex microscopic     Status: Abnormal   Collection Time: 05/22/20  3:15 PM  Result Value Ref Range   Color, Urine STRAW (A) YELLOW   APPearance CLEAR CLEAR   Specific Gravity, Urine 1.006 1.005 - 1.030   pH 6.0 5.0 - 8.0   Glucose, UA NEGATIVE NEGATIVE mg/dL   Hgb urine dipstick NEGATIVE NEGATIVE   Bilirubin Urine NEGATIVE NEGATIVE   Ketones, ur NEGATIVE NEGATIVE mg/dL   Protein, ur NEGATIVE NEGATIVE mg/dL   Nitrite NEGATIVE NEGATIVE   Leukocytes,Ua NEGATIVE NEGATIVE   MAU Course  Procedures Dilaudid 1mg  IV  MDM Labs ordered and reviewed. Consult with Dr. Glo Herring, MD to bedside for evaluation. Plan admit for obs and General Surgery re-consult in am.  Assessment and Plan  [redacted] weeks gestation Umbilical hernia Admit to [facility] unit Mngt per Dr. Maryfrances B[place]ell, CNM 05/22/2020, 9:20 PM

## 2020-05-22 NOTE — MAU Note (Signed)
Pt reports to MAU from [facility] stating she is having pain at her umbilicus. Pt states the pain is a 8/10. Pt states it feels like it is tearing apart. No bleeding or discharge. +FM.

## 2020-05-22 NOTE — ED Notes (Signed)
Transport called.

## 2020-05-22 NOTE — ED Notes (Signed)
Pt ambulated to bathroom with assist of SO

## 2020-05-22 NOTE — ED Notes (Signed)
States hx of hiatal hernia 13 years prior, has increased difficulty x 5 months with current pregnancy.  Has  appt with surgeon on Monday.  Denies any DC or bleeding, does not feel the baby moving at this time.  Pt in extreme pain distress with crying.  Husband at bedside.

## 2020-05-22 NOTE — ED Notes (Signed)
Resumed care of pt. Pt in MRI

## 2020-05-22 NOTE — ED Provider Notes (Signed)
MSE was initiated and I personally evaluated the patient and placed orders (if any) at  1:56 PM on May 22, 2020.  39 year old female reportedly 5 months pregnant with a due date of September 23, 2020 presents with complaint of a ripping abdominal pain located generalized abdomen onset 1 hour ago, constant.  Patient has a known umbilical hernia and is scheduled to follow-up with [place] [facility] for this, is managed by [facility] Dr. Dareen Piano.  Patient denies vomiting, last tablet was yesterday, denies vaginal bleeding.  Patient reports fetal movement this morning, not for the past hour however.  Patient is tearful, appears to be in significant pain, is tachycardic and tachypneic on assessment with elevated blood pressure.  Has a tender umbilical hernia, does not allow for assessment of this very well secondary to her pain.  Contacted to MAU APP who states patient to be seen in the emergency room for clearance of her hernia and if requires further OB care at that time can be sent to MAU however in the meantime recommends activation of rapid response team for monitoring during ER evaluation.  Triage nurse communicated MAU APP recommendations to charge nurse, patient to be moved to a room for further evaluation and treatment.  The patient appears stable so that the remainder of the MSE may be completed by another provider.   Jeannie Fend, PA-C 05/22/20 1358    Linwood Dibbles, MD 05/23/20 [phone]

## 2020-05-22 NOTE — ED Triage Notes (Addendum)
Pt reports being approx 5 months pregnant, has been diagnosed with a hernia and had appt with surgeon. Onset approx 2 hours ago of severe upper abd pain, hernia noted. Had + fetal movement this am but reports decreased movement x 1 hour. Denies any vaginal discharge or bleeding. Pt tearful at triage.

## 2020-05-22 NOTE — Consult Note (Signed)
Reason for Consult:ab pain Referring Physician: Sharen Heck, PA-C  Debbie Griffin is an 39 y.o. female.  HPI: 44 yof who is [redacted] weeks pregnant per her report. She has no prior ab surgery. One prior pregnancy 13 years ago. She denies other medical issues. She has know supraumbilical hernia present that has gotten more uncomfortable during the pregnancy. She reports no issues with the pregnancy.  She was apparently scheduled to see [place] in our office while pregnant for the hernia.  She is tearful and up and down in the bed.  She states her hernia has been out and is causing her more pain. She has n/v but this has been occurring during pregnancy. She continues to have bms.  We were asked to see her.   Past Medical History:  Diagnosis Date  . Adjustment disorder with mixed disturbance of emotions and conduct   . Anxiety   . Dislocation of the knee cap    left 2018  . Miscarriage   . Multiple gastric ulcers   . Opiate abuse, continuous ([facility])   . Ovarian cyst     Past Surgical History:  Procedure Laterality Date  . ANTERIOR CRUCIATE LIGAMENT REPAIR Left 01/12/2018   Procedure: LEFT KNEE ANTERIOR CRUCIATE LIGAMENT (ACL) RECONSTRUCTION, PARTIAL MEDIAL MENISCECTOMY;  Surgeon: Cammy Copa, MD;  Location: [facility];  Service: Orthopedics;  Laterality: Left;  . ESOPHAGOGASTRODUODENOSCOPY    . WISDOM TOOTH EXTRACTION      Family History  Problem Relation Age of Onset  . Thyroid disease Father   . Other Neg Hx     Social History:  reports that she has been smoking cigarettes. She has never used smokeless tobacco. She reports current alcohol use. She reports that she does not use drugs.  Allergies: No Known Allergies  Medications: I have reviewed the patient's current medications.  Results for orders placed or performed during the hospital encounter of 05/22/20 (from the past 48 hour(s))  Lipase, blood     Status: None   Collection Time: 05/22/20  1:53 PM  Result Value Ref Range    Lipase 23 11 - 51 U/L    Comment: Performed at [facility], [address] [place], [place] [address]  Comprehensive metabolic panel     Status: Abnormal   Collection Time: 05/22/20  1:53 PM  Result Value Ref Range   Sodium 137 135 - 145 mmol/L   Potassium 3.7 3.5 - 5.1 mmol/L   Chloride 105 98 - 111 mmol/L   CO2 22 22 - 32 mmol/L   Glucose, Bld 87 70 - 99 mg/dL    Comment: Glucose reference range applies only to samples taken after fasting for at least 8 hours.   BUN 7 6 - 20 mg/dL   Creatinine, Ser 3.47 0.44 - 1.00 mg/dL   Calcium 8.7 (L) 8.9 - 10.3 mg/dL   Total Protein 6.2 (L) 6.5 - 8.1 g/dL   Albumin 3.6 3.5 - 5.0 g/dL   AST 15 15 - 41 U/L   ALT 12 0 - 44 U/L   Alkaline Phosphatase 51 38 - 126 U/L   Total Bilirubin 0.8 0.3 - 1.2 mg/dL   GFR calc non Af Amer >60 >60 mL/min   GFR calc Af Amer >60 >60 mL/min   Anion gap 10 5 - 15    Comment: Performed at [facility], [address] [place], [place] [address]  CBC     Status: None   Collection Time: 05/22/20  1:53 PM  Result Value Ref Range   WBC 9.3 4.0 - 10.5 K/uL   RBC 4.02 3.87 - 5.11 MIL/uL   Hemoglobin 12.4 12.0 - 15.0 g/dL   HCT 37.1 36 - 46 %   MCV 92.3 80.0 - 100.0 fL   MCH 30.8 26.0 - 34.0 pg   MCHC 33.4 30.0 - 36.0 g/dL   RDW 12.5 11.5 - 15.5 %   Platelets 209 150 - 400 K/uL   nRBC 0.0 0.0 - 0.2 %    Comment: Performed at [facility] [address], [place], [place] [address]  I-Stat beta hCG blood, ED     Status: Abnormal   Collection Time: 05/22/20  2:02 PM  Result Value Ref Range   I-stat hCG, quantitative >2,000.0 (H) <5 mIU/mL   Comment 3            Comment:   GEST. AGE      CONC.  (mIU/mL)   <=1 WEEK        5 - 50     2 WEEKS       50 - 500     3 WEEKS       100 - 10,000     4 WEEKS     1,000 - 30,000        FEMALE AND NON-PREGNANT FEMALE:     LESS THAN 5 mIU/mL     No results found.  Review of Systems  Gastrointestinal: Positive for abdominal pain, nausea and vomiting.   All other systems reviewed and are negative.  Blood pressure (!) 141/79, pulse (!) 103, temperature 98.4 F (36.9 C), temperature source Oral, resp. rate (!) 28, height 5\' 8"  (1.727 m), weight 74.8 kg, last menstrual period 12/06/2019, SpO2 100 %. Physical Exam General tearful, nad cv rr abd soft, supraumbilical hernia is tender but easily reducible, no peritoneal signs  Assessment/Plan: Supraumbilical hernia -her exam is not consistent with this hernia as it feels reducible and soft (they actually state that it is smaller) -I think ob evaluation would be indicated -discussed with ER obtaining an MRI asap to evaluate  Rolm Bookbinder 05/22/2020, 3:28 PM

## 2020-05-22 NOTE — Progress Notes (Signed)
G3P1 at 22 2/7 weeks reports to [facility] for severe umbilical pain.  Dopplered FHT's 145.   Has appt for a surgical consult on her hx of hiatal hernia on Monday. Sees [facility] for [facility].  Dr Tenny Craw updated on pt status.  At this time, keep appt with [facility] on this coming Tuesday.  Pt is cleared from [facility].  Pt is to have a surgical consult at this time in the ER.  Call [facility] [phone] if pt is a surgical candidate and will come check FHT's before and after surgery if needed.

## 2020-05-22 NOTE — ED Provider Notes (Addendum)
[facility] EMERGENCY DEPARTMENT Provider Note   CSN: [id number] Arrival date & time: 05/22/20  1329     History Chief Complaint  Patient presents with  . Abdominal Pain    Debbie Griffin is a 39 y.o. female currently [redacted] weeks pregnant presents to the ER for evaluation of sudden onset periumbilical abdominal pain that was sudden starting around 10 AM today.  Pain is constant, severe.  Described as "tearing" in the area above her abdomen.  Patient is teary-eyed, comfortable.  States this is her second pregnancy.  Was told by OB that her muscles were separating.  She denies any fevers.  No nausea, vomiting, changes to her bowel movements.  No urinary symptoms like hematuria, urinary frequency.  Denies vaginal symptoms like abnormal vaginal discharge, leaking of fluid, bleeding.  Has been feeling the fetus move.  States she has had an umbilical hernia for a long time that has caused her mild pain throughout her pregnancy.  Actually states that the bulging above the bellybutton is usually much bigger but today it looks smaller.  She had was referred to general surgery clinic for an appointment of her umbilical hernia.  Patient states she has had routine OB care, has had her anatomy ultrasound and was told everything was normal.  HPI     Past Medical History:  Diagnosis Date  . Adjustment disorder with mixed disturbance of emotions and conduct   . Anxiety   . Dislocation of the knee cap    left 2018  . Miscarriage   . Multiple gastric ulcers   . Opiate abuse, continuous (HCC)   . Ovarian cyst     Patient Active Problem List   Diagnosis Date Noted  . Left knee injury, subsequent encounter 08/04/2017  . Opiate abuse, episodic (HCC) 10/31/2016  . 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 ANTERIOR CRUCIATE LIGAMENT REPAIR Left 01/12/2018   Procedure: LEFT KNEE ANTERIOR CRUCIATE  LIGAMENT (ACL) RECONSTRUCTION, PARTIAL MEDIAL MENISCECTOMY;  Surgeon: Cammy Copa, MD;  Location: [facility] OR;  Service: Orthopedics;  Laterality: Left;  . ESOPHAGOGASTRODUODENOSCOPY    . WISDOM TOOTH EXTRACTION       OB History    Gravida  3   Para  1   Term  1   Preterm      AB  1   Living  1     SAB  1   TAB      Ectopic      Multiple      Live Births              Family History  Problem Relation Age of Onset  . Thyroid disease Father   . Other Neg Hx     Social History   Tobacco Use  . Smoking status: Current Some Day Smoker    Types: Cigarettes  . Smokeless tobacco: Never Used  Vaping Use  . Vaping Use: Never used  Substance Use Topics  . Alcohol use: Yes    Comment: rarely  . Drug use: No    Comment: heroin (past)    Home Medications Prior to Admission medications   Medication Sig Start Date End Date Taking? Authorizing Provider  Prenatal Vit-Fe Fumarate-FA (PRENATAL VITAMIN) 27-0.8 MG TABS Take 1 tablet by mouth daily.   Yes [provider]    Allergies    Patient has no known allergies.  Review of  Systems   Review of Systems  Gastrointestinal: Positive for abdominal pain.  All other systems reviewed and are negative.   Physical Exam Updated Vital Signs BP 104/63   Pulse 66   Temp 98.4 F (36.9 C) (Oral)   Resp (!) 28   Ht 5\' 8"  (1.727 m)   Wt 74.8 kg   LMP 12/06/2019   SpO2 97%   BMI 25.09 kg/m   Physical Exam Vitals and nursing note reviewed.  Constitutional:      Appearance: She is well-developed.     Comments: Non toxic in NAD  HENT:     Head: Normocephalic and atraumatic.     Nose: Nose normal.  Eyes:     Conjunctiva/sclera: Conjunctivae normal.  Cardiovascular:     Rate and Rhythm: Normal rate and regular rhythm.  Pulmonary:     Effort: Pulmonary effort is normal.     Breath sounds: Normal breath sounds.  Abdominal:     General: Bowel sounds are normal.     Palpations: Abdomen is soft.      Tenderness: There is no abdominal tenderness.     Hernia: A hernia is present. Hernia is present in the umbilical area and ventral area.     Comments: Gravid uterus palpated just below the umbilicus.  Small, minimally tender umbilical hernia, easily reduced.  Small, minimally tender ventral hernia just above the umbilicus that is easily reduced.  Skin is normal on the abdomen.  No right lower quadrant pain.  No CVA tenderness.  Musculoskeletal:        General: Normal range of motion.     Cervical back: Normal range of motion.  Skin:    General: Skin is warm and dry.     Capillary Refill: Capillary refill takes less than 2 seconds.  Neurological:     Mental Status: She is alert.  Psychiatric:        Behavior: Behavior normal.     ED Results / Procedures / Treatments   Labs (all labs ordered are listed, but only abnormal results are displayed) Labs Reviewed  COMPREHENSIVE METABOLIC PANEL - Abnormal; Notable for the following components:      Result Value   Calcium 8.7 (*)    Total Protein 6.2 (*)    All other components within normal limits  URINALYSIS, ROUTINE W REFLEX MICROSCOPIC - Abnormal; Notable for the following components:   Color, Urine STRAW (*)    All other components within normal limits  I-STAT BETA HCG BLOOD, ED ([facility], [facility], [facility] ONLY) - Abnormal; Notable for the following components:   I-stat hCG, quantitative >2,000.0 (*)    All other components within normal limits  LIPASE, BLOOD  CBC    EKG None  Radiology No results found.  Procedures Procedures (including critical care time)  Medications Ordered in ED Medications  sodium chloride flush (NS) 0.9 % injection 3 mL (3 mLs Intravenous Not Given 05/22/20 1459)  sodium chloride 0.9 % bolus 1,000 mL (0 mLs Intravenous Stopped 05/22/20 1555)  fentaNYL (SUBLIMAZE) injection 50 mcg (50 mcg Intravenous Given 05/22/20 1518)  fentaNYL (SUBLIMAZE) injection 50 mcg (50 mcg Intravenous Given 05/22/20 1730)  butorphanol  (STADOL) injection 1 mg (1 mg Intravenous Given 05/22/20 1929)    ED Course  I have reviewed the triage vital signs and the nursing notes.  Pertinent labs & imaging results that were available during my care of the patient were reviewed by me and considered in my medical decision making (see chart for details).  Clinical Course as of May 23 2003  Fri May 22, 2020  1443 [facility] - 22 weeks, dopplers ok HR 145. No Ob complaints. Dr Tenny Craw has cleared patient from OB stand point. Recommends fentanyl, stadol 1-2 mg   [CG]  1627 Spoke to MAU APP and explained Dr Dwain Sarna recommendations to formal OB consult. APP notified Dr Tenny Craw.    [CG]  1629 I spoke to Dr Tenny Craw. States [facility] RN Carollee Herter is one of their bests Rns.  She feels confident in MSK etiology of abdominal pain.  Dr Tenny Craw does not think OB needs formal consult/in person evaluation in the ER at this time.  States if patient would like to come over to MAU after ER work up/MRI, she is welcome to do so.  I did explain to Dr Tenny Craw my exam is not consistent with acute hernia complication, hernia is smaller than usual, minimally tender and reducible. Also let Dr Tenny Craw know Dr Dwain Sarna documented recommendation for [facility] formal consult and benign hernia exam.  Dr Tenny Craw did not think there was emergent need for OB eval in ER.    [CG]  1935 Mild stranding along ventral hernia, but no surgical process.  Body radiologist will share images with another radiologist for second opinion. Fetus anatomy normal.  No formal pelvis MRI but views are sufficient, no evidence of appendicitis.  Cervical cord noted. Per Dr Elvera Maria will share images with another radiologist. Formal reading posted tomorrow morning.    [CG]    Clinical Course User Index [CG] Liberty Handy, PA-C   MDM Rules/Calculators/A&P                          G3, P1 female currently [redacted] weeks pregnant presents with periumbilical abdominal pain.  On my exam she is uncomfortable appearing, teary-eyed.   Has very small umbilical hernia that is easily reducible and nontender.  She has slightly larger supraumbilical hernia that is mildly tender but reducible.  No red flags like fever, worsening vomiting in setting of pregnancy, changes in her bowel movements, urinary symptoms.  No OB complaints.  Have obtained additional history from previous medical records available.  Nursing notes, triage notes and [facility] notes reviewed to obtain more history and assist with MDM.  The differential diagnosis includes hernia complication although exam is not very convincing for this.  Patient herself states her hernias are actually smaller now than usually.  Her pain is diffusely around the umbilicus.  Appendicitis, UTI, renal stool also considered.  Fetal heart rate tones per rapid [facility] normal.  Bedside ultrasound performed by me shows a moving fetus with cardiac activity.  Placenta visualized.  I ordered fentanyl 50 mcg x 2 for pain.  ER work-up initiated in triage including labs, urinalysis and hCG.  These were personally visualized, interpreted and are nonacute. No evidence of UTI, hematuria.   Per rapid [facility] nurse Shannon FHR 145.  She spoke to Dr. Tenny Craw who has cleared the patient from an OB standpoint.  They recommend general surgery evaluation.  I consulted Dr. Dwain Sarna with general surgery team who came to the ER to evaluate the patient at bedside.  He was also able to reduce hernia without significant difficulty or pain.  Low likelihood that this is an acute hernia complication.  He recommends OB formal consult and MRI to evaluate for hernia/intraabdominal and surgical emergency.   I spoke to MAU APP and MAU physician Dr. Tenny Craw who does not think  patient needs a formal OB/in person evaluation or consult.  Patient to go to MAU after ER work-up if continued pain.  1825: I evaluated patient and she continues to be very uncomfortable despite fentanyl.  We will try Stadol.  Pending MR.   1945: See verbal report  from radiologist. Will repage general surgery for final recommendations.   1948: Spoke to Dr. Alvino Blood with general surgery who reviewed MRI, agrees there is no surgical intervention recommended.  We will discussed findings with patient.  She reports some improvement of pain after fentanyl 100 mcg and Stadol.  Will recommend transfer to MAU for further pain control. Patient wants to know why she is hurting.   Final Clinical Impression(s) / ED Diagnoses Final diagnoses:  Reducible umbilical hernia    Rx / DC Orders ED Discharge Orders    None         Arlean Hopping 05/22/20 2004    Charlesetta Shanks, MD 05/26/20 [phone]

## 2020-05-22 NOTE — ED Notes (Signed)
Pt jovial and able to relax.

## 2020-05-23 LAB — SARS CORONAVIRUS 2 BY RT PCR (HOSPITAL ORDER, PERFORMED IN ~~LOC~~ HOSPITAL LAB): SARS Coronavirus 2: NEGATIVE

## 2020-05-23 LAB — CBC
HCT: 30.1 % — ABNORMAL LOW (ref 36.0–46.0)
Hemoglobin: 10.3 g/dL — ABNORMAL LOW (ref 12.0–15.0)
MCH: 31.2 pg (ref 26.0–34.0)
MCHC: 34.2 g/dL (ref 30.0–36.0)
MCV: 91.2 fL (ref 80.0–100.0)
Platelets: 176 10*3/uL (ref 150–400)
RBC: 3.3 MIL/uL — ABNORMAL LOW (ref 3.87–5.11)
RDW: 12.9 % (ref 11.5–15.5)
WBC: 7 10*3/uL (ref 4.0–10.5)
nRBC: 0 % (ref 0.0–0.2)

## 2020-05-23 MED ORDER — OXYCODONE-ACETAMINOPHEN 5-325 MG PO TABS: 1.0000 | ORAL_TABLET | Freq: Four times a day (QID) | ORAL | 0 refills | Status: DC | PRN

## 2020-05-23 MED ORDER — OXYCODONE-ACETAMINOPHEN 5-325 MG PO TABS: 1.0000 | ORAL_TABLET | ORAL | Status: DC | PRN

## 2020-05-23 NOTE — Progress Notes (Signed)
Pt discharged after discharge instructions and prescription given. All questions answered. IV was discontinued. Pt discharged with all belongings in stable condition.

## 2020-05-23 NOTE — H&P (Signed)
Debbie Griffin is a 39 y.o. female presenting for abdominal pain  38 yo G3P1011 @ 22+2 presents to ED with worsening abdominal pain related to a known ventral hernia. Pt seen and evaluated in out office last week for the same complaint. Referred to general surgery, Appt Monday 6/22. In ED, cleared from an obstetric standpoint. Surgery evaluated and MRI performed. Not felt to be surgical candidate at this time. Pt with persistent pain, requiring narcotics and sent back to MAU for additional management. Pt admitted to ante for 23 hr obs and pain management OB History    Gravida  3   Para  1   Term  1   Preterm      AB  1   Living  1     SAB  1   TAB      Ectopic      Multiple      Live Births             Past Medical History:  Diagnosis Date  . Adjustment disorder with mixed disturbance of emotions and conduct   . Anxiety   . Dislocation of the knee cap    left 2018  . Miscarriage   . Multiple gastric ulcers   . Opiate abuse, continuous (HCC)   . Ovarian cyst    Past Surgical History:  Procedure Laterality Date  . ANTERIOR CRUCIATE LIGAMENT REPAIR Left 01/12/2018   Procedure: LEFT KNEE ANTERIOR CRUCIATE LIGAMENT (ACL) RECONSTRUCTION, PARTIAL MEDIAL MENISCECTOMY;  Surgeon: Cammy Copa, MD;  Location: [facility] OR;  Service: Orthopedics;  Laterality: Left;  . ESOPHAGOGASTRODUODENOSCOPY    . WISDOM TOOTH EXTRACTION     Family History: family history includes Thyroid disease in her father. Social History:  reports that she has been smoking cigarettes. She has never used smokeless tobacco. She reports current alcohol use. She reports that she does not use drugs.     Maternal Diabetes: too early Genetic Screening: Normal Maternal Ultrasounds/Referrals: Normal Fetal Ultrasounds or other Referrals:  None Maternal Substance Abuse:  Yes:  Type: Marijuana, tobacco Significant Maternal Medications:  None Significant Maternal Lab Results:  None Other Comments:   None  Review of Systems History Dilation: Closed Exam by:: melanie bhambri cnm Blood pressure 122/67, pulse 91, temperature 98.2 F (36.8 C), temperature source Oral, resp. rate 14, height 5\' 8"  (1.727 m), weight 74.8 kg, last menstrual period 12/06/2019, SpO2 100 %. Exam Physical Exam  Prenatal labs: ABO, Rh: --/--/O NEG, O NEG Performed at [facility], [address] [place], [place]  [phone] 2239) Antibody: NEG (06/18 2239) Rubella:  imm RPR:   NR HBsAg:   Neg HIV:   NR GBS:   too soon  Assessment/Plan: 1) Admit for obs and pain control 2) Gen surg to re-eval in am   2240 05/23/2020, 12:13 PM

## 2020-05-23 NOTE — Progress Notes (Signed)
Subjective: Feeling better this am.  On a PCA.  Hasn't eaten  ROS: See above, otherwise other systems negative  Objective: Vital signs in last 24 hours: Temp:  [97.8 F (36.6 C)-98.5 F (36.9 C)] 98.3 F (36.8 C) (06/19 0816) Pulse Rate:  [66-103] 84 (06/19 0816) Resp:  [14-28] 15 (06/19 0816) BP: (93-141)/(48-79) 97/55 (06/19 0816) SpO2:  [97 %-100 %] 99 % (06/19 0816) Weight:  [74.8 kg] 74.8 kg (06/18 1335) Last BM Date: 05/21/20  Intake/Output from previous day: 06/18 0701 - 06/19 0700 In: 2192.7 [P.O.:240; I.V.:952.7; IV Piggyback:1000] Out: -  Intake/Output this shift: Total I/O In: 237.2 [I.V.:237.2] Out: -   PE: Abd: soft, hernia is reduced and defect is palpable.  Rest of abdomen is soft and gravid.  +BS  Lab Results:  Recent Labs    05/22/20 1353 05/23/20 0606  WBC 9.3 7.0  HGB 12.4 10.3*  HCT 37.1 30.1*  PLT 209 176   BMET Recent Labs    05/22/20 1353  NA 137  K 3.7  CL 105  CO2 22  GLUCOSE 87  BUN 7  CREATININE 0.68  CALCIUM 8.7*   PT/INR No results for input(s): LABPROT, INR in the last 72 hours. CMP     Component Value Date/Time   NA 137 05/22/2020 1353   K 3.7 05/22/2020 1353   CL 105 05/22/2020 1353   CO2 22 05/22/2020 1353   GLUCOSE 87 05/22/2020 1353   BUN 7 05/22/2020 1353   CREATININE 0.68 05/22/2020 1353   CALCIUM 8.7 (L) 05/22/2020 1353   PROT 6.2 (L) 05/22/2020 1353   ALBUMIN 3.6 05/22/2020 1353   AST 15 05/22/2020 1353   ALT 12 05/22/2020 1353   ALKPHOS 51 05/22/2020 1353   BILITOT 0.8 05/22/2020 1353   GFRNONAA >60 05/22/2020 1353   GFRAA >60 05/22/2020 1353   Lipase     Component Value Date/Time   LIPASE 23 05/22/2020 1353       Studies/Results: MR ABDOMEN WO CONTRAST  Result Date: 05/23/2020 CLINICAL DATA:  Ventral hernia.  Abdominal pain. EXAM: MRI ABDOMEN WITHOUT CONTRAST TECHNIQUE: Multiplanar multisequence MR imaging was performed without the administration of intravenous contrast.  COMPARISON:  None. FINDINGS: Lower chest:  Lung bases are clear. Hepatobiliary: No biliary duct dilatation. Gallbladder appears normal without inflammation. Common bile duct normal. Pancreas: No inflammation Spleen: Normal spleen. Adrenals/urinary tract: No hydronephrosis. Stomach/Bowel: Stomach limited view of the bowel is unremarkable. Vascular/Lymphatic: Abdominal aortic normal caliber. No retroperitoneal periportal lymphadenopathy. Musculoskeletal: No aggressive osseous lesion Other: There a 2 ventral hernias. One hernia at the umbilicus and 1 just superior. These are best seen on sagittal image series 12. The more superior hernia sac measures 2.3 cm with a 0.5 cm mouth (image 16/12). The smaller umbilical hernia measures 14 mm with a 6 mm mouth. No clear evidence of bowel entering these small ventral hernias. There is mild inflammation and fluid within the hernia sacs. Gravid uterus no gross complication. The entire pelvis not imaged. No evidence of appendicitis on partial imaging of pelvis. IMPRESSION: 1. Two small ventral hernias which contain a small amount of fluid and minimal inflammation. 2.  Limited view of the pelvis without evidence of appendicitis. 3.  No hydronephrosis. 4.  Gravid uterus. Electronically Signed   By: Suzy Bouchard M.D.   On: 05/23/2020 04:27    Anti-infectives: Anti-infectives (From admission, onward)   None       Assessment/Plan  Abdominal pain, unclear etiology Reducible  umbilical hernia -this is reduced.  This is felt to be unlikely to be her source of pain as her hernia has always been reducible.  It comes out when she sits up etc, but goes right back in when she lays down or it is pushed on. -she may eat -no plans for hernia repair at this time or during her pregnancy unless incarcerated given high risk of failure due to expansion from pregnancy and risk to baby -no further surgical needs.  Patient stable from our standpoint.  We will sign off.   FEN -  regular diet VTE - per primary ID - none need from abdominal standpoint   LOS: 1 day    Letha Cape , [facility] 05/23/2020, 9:27 AM Please see Amion for pager number during day hours 7:00am-4:30pm or 7:00am -11:30am on weekends

## 2020-05-23 NOTE — Discharge Summary (Signed)
Physician Discharge Summary  Patient ID: Debbie Griffin MRN: [id number] DOB/AGE: 1980/12/29 39 y.o.  Admit date: 05/22/2020 Discharge date: 05/23/2020  Admission Diagnoses: Abdominal pain, ventral hernia  Discharge Diagnoses:  Active Problems:   Abdominal wall hernia   Discharged Condition: good  Hospital Course: Surgical consultation, pain management, abdominal MRI  Consults: general surgery  Significant Diagnostic Studies: radiology: MRI  Treatments: IV hydration and analgesia: Dilaudid  Discharge Exam: Blood pressure 122/67, pulse 91, temperature 98.2 F (36.8 C), temperature source Oral, resp. rate 14, height 5\' 8"  (1.727 m), weight 74.8 kg, last menstrual period 12/06/2019, SpO2 100 %. General appearance: alert, cooperative and appears stated age  Abd: soft, mildly tender, ventral hernia superior to umbilicus  Disposition: Discharge disposition: 01-Home or Self Care       Discharge Instructions    Discharge activity:  No Restrictions   Complete by: As directed    Discharge diet:  No restrictions   Complete by: As directed    No sexual activity restrictions   Complete by: As directed      Allergies as of 05/23/2020   No Known Allergies     Medication List    TAKE these medications   oxyCODONE-acetaminophen 5-325 MG tablet Commonly known as: PERCOCET/ROXICET Take 1-2 tablets by mouth every 6 (six) hours as needed for severe pain.   Prenatal Vitamin 27-0.8 MG Tabs Take 1 tablet by mouth daily.        Signed: 05/25/2020 05/23/2020, 12:33 PM

## 2020-05-25 LAB — GC/CHLAMYDIA PROBE AMP (~~LOC~~) NOT AT ARMC
Chlamydia: NEGATIVE
Comment: NEGATIVE
Comment: NORMAL
Neisseria Gonorrhea: NEGATIVE

## 2020-07-06 ENCOUNTER — Encounter (HOSPITAL_COMMUNITY): Payer: Self-pay | Admitting: Obstetrics

## 2020-07-06 ENCOUNTER — Inpatient Hospital Stay (HOSPITAL_COMMUNITY)
Admission: AD | Admit: 2020-07-06 | Discharge: 2020-07-06 | Discharge status: Against Medical Advice | Payer: Medicaid Other | Attending: Obstetrics | Admitting: Obstetrics

## 2020-07-06 ENCOUNTER — Other Ambulatory Visit: Payer: Self-pay

## 2020-07-06 DIAGNOSIS — F1111 Opioid abuse, in remission: Secondary | ICD-10-CM | POA: Insufficient documentation

## 2020-07-06 DIAGNOSIS — O26893 Other specified pregnancy related conditions, third trimester: Secondary | ICD-10-CM | POA: Diagnosis not present

## 2020-07-06 DIAGNOSIS — O99323 Drug use complicating pregnancy, third trimester: Secondary | ICD-10-CM | POA: Diagnosis not present

## 2020-07-06 DIAGNOSIS — R197 Diarrhea, unspecified: Secondary | ICD-10-CM | POA: Insufficient documentation

## 2020-07-06 DIAGNOSIS — O09523 Supervision of elderly multigravida, third trimester: Secondary | ICD-10-CM | POA: Insufficient documentation

## 2020-07-06 DIAGNOSIS — Z3689 Encounter for other specified antenatal screening: Secondary | ICD-10-CM

## 2020-07-06 DIAGNOSIS — Z87891 Personal history of nicotine dependence: Secondary | ICD-10-CM | POA: Insufficient documentation

## 2020-07-06 DIAGNOSIS — K439 Ventral hernia without obstruction or gangrene: Secondary | ICD-10-CM | POA: Diagnosis not present

## 2020-07-06 DIAGNOSIS — Z5329 Procedure and treatment not carried out because of patient's decision for other reasons: Secondary | ICD-10-CM | POA: Diagnosis not present

## 2020-07-06 DIAGNOSIS — F1110 Opioid abuse, uncomplicated: Secondary | ICD-10-CM

## 2020-07-06 DIAGNOSIS — O99613 Diseases of the digestive system complicating pregnancy, third trimester: Secondary | ICD-10-CM | POA: Insufficient documentation

## 2020-07-06 DIAGNOSIS — Z3A28 28 weeks gestation of pregnancy: Secondary | ICD-10-CM | POA: Insufficient documentation

## 2020-07-06 DIAGNOSIS — O212 Late vomiting of pregnancy: Secondary | ICD-10-CM | POA: Insufficient documentation

## 2020-07-06 LAB — OB RESULTS CONSOLE GBS: GBS: POSITIVE

## 2020-07-06 MED ORDER — FENTANYL CITRATE (PF) 100 MCG/2ML IJ SOLN: 100.0000 ug | Freq: Once | INTRAMUSCULAR | Status: DC

## 2020-07-06 MED ORDER — KETOROLAC TROMETHAMINE 30 MG/ML IJ SOLN
15.0000 mg | Freq: Once | INTRAMUSCULAR | Status: AC
  Administered 2020-07-06: 15 mg via INTRAVENOUS
  Filled 2020-07-06: qty 1

## 2020-07-06 MED ORDER — PROMETHAZINE HCL 25 MG/ML IJ SOLN
12.5000 mg | Freq: Once | INTRAMUSCULAR | Status: AC
  Administered 2020-07-06: 12.5 mg via INTRAVENOUS
  Filled 2020-07-06: qty 1

## 2020-07-06 MED ORDER — LACTATED RINGERS IV BOLUS
1000.0000 mL | Freq: Once | INTRAVENOUS | Status: AC
  Administered 2020-07-06: 1000 mL via INTRAVENOUS

## 2020-07-06 MED ORDER — FENTANYL CITRATE (PF) 100 MCG/2ML IJ SOLN
100.0000 ug | Freq: Once | INTRAMUSCULAR | Status: AC
  Administered 2020-07-06: 100 ug via INTRAMUSCULAR
  Filled 2020-07-06: qty 2

## 2020-07-06 MED ORDER — PROMETHAZINE HCL 25 MG/ML IJ SOLN
12.5000 mg | Freq: Once | INTRAMUSCULAR | Status: AC
  Administered 2020-07-06: 12.5 mg via INTRAMUSCULAR
  Filled 2020-07-06: qty 1

## 2020-07-06 MED ORDER — HYDROMORPHONE HCL 1 MG/ML IJ SOLN
1.0000 mg | Freq: Once | INTRAMUSCULAR | Status: AC
  Administered 2020-07-06: 1 mg via INTRAVENOUS

## 2020-07-06 MED ORDER — HYDROMORPHONE HCL 1 MG/ML IJ SOLN
1.0000 mg | Freq: Once | INTRAMUSCULAR | Status: DC
  Filled 2020-07-06: qty 1

## 2020-07-06 NOTE — MAU Provider Note (Signed)
History     CSN: [id number]  Arrival date and time: 07/06/20 1238   First Provider Initiated Contact with Patient 07/06/20 1326      Chief Complaint  Patient presents with  . Abdominal Pain   Ms. Mosetta Ferdinand is a 39 y.o. G3P1011 at [redacted]w[redacted]d who presents to MAU for hernial pain. Patient reports she knows that this is her hernia pain rather than another cause of abdominal pain because she has been seen for this pain before and now it is worse. Patient reports she went to the ED mid June 2021 for evaluation for hernial pain and was admitted for overnight observation and evaluated for surgery, but reports she was told she was not a surgical candidate, and was advised that the hernias would get bigger and worse and she would be back in the hospital again.  Patient reports after she left the hospital mid June, she stated that the hernia pain was absent until she would throw up or lift something. Patient states pain became severe a couple of days ago, and she tried taking Percocet, but it did not work. Patient reports she came to MAU today because "I cannot take the pain, it's so bad." Patient reports N/V/diarrhea started last night. Patient also endorses the "worst heartburn I've ever had" which she reports she feels in her throat. Patient endorses vomiting x4 in past 24 hours. Patient reports diarrhea x7 in past 24hours which she describes as "bright yellow and brown" and watery. Patient reports she has not had anything to eat or drink in the past 24hours.  Patient does have a history of opioid abuse, but denies using opioids except those that she was prescribed.  Pt denies VB, LOF, ctx, decreased FM, vaginal discharge/odor/itching. Pt denies constipation, diarrhea, or urinary problems. Pt denies fever, chills, fatigue, sweating or changes in appetite. Pt denies SOB or chest pain. Pt denies dizziness, HA, light-headedness, weakness.  Problems this pregnancy include: N/V since beginning of  pregnancy. Allergies? NKDA Current medications/supplements? PNVs, last took Percocet 2 days Prenatal care provider? [facility], next appt 07/13/2020   OB History    Gravida  3   Para  1   Term  1   Preterm      AB  1   Living  1     SAB  1   TAB      Ectopic      Multiple      Live Births  1           Past Medical History:  Diagnosis Date  . Adjustment disorder with mixed disturbance of emotions and conduct   . Anxiety   . Dislocation of the knee cap    left 2018  . Miscarriage   . Multiple gastric ulcers   . Opiate abuse, continuous (HCC)   . Ovarian cyst     Past Surgical History:  Procedure Laterality Date  . ANTERIOR CRUCIATE LIGAMENT REPAIR Left 01/12/2018   Procedure: LEFT KNEE ANTERIOR CRUCIATE LIGAMENT (ACL) RECONSTRUCTION, PARTIAL MEDIAL MENISCECTOMY;  Surgeon: Cammy Copa, MD;  Location: [facility] OR;  Service: Orthopedics;  Laterality: Left;  . ESOPHAGOGASTRODUODENOSCOPY    . WISDOM TOOTH EXTRACTION      Family History  Problem Relation Age of Onset  . Thyroid disease Father   . Other Neg Hx     Social History   Tobacco Use  . Smoking status: Former Smoker    Types: Cigarettes  . Smokeless tobacco: Never Used  Vaping  Use  . Vaping Use: Never used  Substance Use Topics  . Alcohol use: Not Currently    Comment: rarely  . Drug use: No    Comment: heroin (past)    Allergies: No Known Allergies  No medications prior to admission.    Review of Systems  Constitutional: Negative for chills, diaphoresis, fatigue and fever.  Eyes: Negative for visual disturbance.  Respiratory: Negative for shortness of breath.   Cardiovascular: Negative for chest pain.  Gastrointestinal: Positive for abdominal pain (umbilical, hernia). Negative for constipation, diarrhea, nausea and vomiting.  Genitourinary: Negative for dysuria, flank pain, frequency, pelvic pain, urgency, vaginal bleeding and vaginal discharge.  Neurological: Negative for  dizziness, weakness, light-headedness and headaches.   Physical Exam   Blood pressure 119/64, pulse 71, temperature 98.6 F (37 C), resp. rate (!) 27, last menstrual period 12/06/2019, SpO2 98 %.  Patient Vitals for the past 24 hrs:  BP Temp Pulse Resp SpO2  07/06/20 1725 119/64 98.6 F (37 C) 71 (!) 27 98 %  07/06/20 1312 123/63 -- 69 -- --  07/06/20 1310 -- 97.9 F (36.6 C) -- (!) 28 98 %   Physical Exam Vitals and nursing note reviewed.  Constitutional:      General: She is in acute distress.     Appearance: Normal appearance. She is normal weight. She is not ill-appearing, toxic-appearing or diaphoretic.  HENT:     Head: Normocephalic and atraumatic.  Pulmonary:     Effort: Pulmonary effort is normal.  Abdominal:     General: There is no distension.     Palpations: Abdomen is soft. There is no mass.     Tenderness: There is abdominal tenderness (around umbilicus at areas of hernia).     Hernia: A hernia is present.  Neurological:     Mental Status: She is alert and oriented to person, place, and time.  Psychiatric:        Mood and Affect: Mood normal.        Behavior: Behavior normal.        Thought Content: Thought content normal.        Judgment: Judgment normal.    No results found for this or any previous visit (from the past 24 hour(s)).   MAU Course  Procedures  MDM -known ventral hernia x2 with recent admission for pain control -worsening of pain over past couple of days with N/V/diarrhea -Dr. Shawnie Pons consulted and presents to bedside for manual reduction of hernia with binding apparatus placed on abdomen after reduction and abdominal binder given for home use -fentanyl and phenergan 12.5mg  given for pain -pt reports no change in pain after administration of above medications and patient is reporting dehydration but is unable to give urine sample -Dr. Shawnie Pons consulted again and recommends Toradol 15mg , LR bolus and additional 12.5mg  Phenergan -EFM:  reactive with variables       -baseline: 155       -variability: moderate       -accels: present, 10x10       -decels: multiple variables       -TOCO: unable to adequately trace as patient is not able to sit still for monitoring -per Dr. , tracing normal and patient does not need further monitoring while in MAU -after medication administration, pt reports pain now 6/10, but reports she wants to leave as "nothing we are doing is working for her" -provider working with another patient at that time, and patient states she wants to leave and  is not amenable to waiting until provider is available for reassessment -pt left AMA -no urine sample provided by patient during visit  Orders Placed This Encounter  Procedures  . Urinalysis, Routine w reflex microscopic    Standing Status:   Standing    Number of Occurrences:   1  . Insert peripheral IV    Standing Status:   Standing    Number of Occurrences:   1   Meds ordered this encounter  Medications  . DISCONTD: fentaNYL (SUBLIMAZE) injection 100 mcg  . promethazine (PHENERGAN) injection 12.5 mg  . fentaNYL (SUBLIMAZE) injection 100 mcg  . ketorolac (TORADOL) 30 MG/ML injection 15 mg  . lactated ringers bolus 1,000 mL  . DISCONTD: HYDROmorphone (DILAUDID) injection 1 mg  . promethazine (PHENERGAN) injection 12.5 mg  . HYDROmorphone (DILAUDID) injection 1 mg    Assessment and Plan   1. Abdominal wall hernia   2. Opiate abuse, episodic (HCC)   3. [redacted] weeks gestation of pregnancy   4. NST (non-stress test) reactive     -pt left AMA  Odie Sera Eula Mazzola 07/06/2020, 6:39 PM

## 2020-07-06 NOTE — MAU Note (Signed)
Pt was unable to give a urine sample.

## 2020-07-06 NOTE — MAU Note (Signed)
Pt states that she has the worst heart burn and it is making her throw up blood.   Pt reports that her abdominal muscles feel like they are ripping apart. Pt states she was told last time she was here for this that this would get worse and there was nothing they could do about it until she delivered.   Pt reports she has an umbilical hernia.   Pt reports watery diarrhea for the last two days.   Denies vaginal bleeding or LOF.

## 2020-08-31 ENCOUNTER — Inpatient Hospital Stay (HOSPITAL_COMMUNITY): Payer: Medicaid Other | Admitting: Anesthesiology

## 2020-08-31 ENCOUNTER — Other Ambulatory Visit: Payer: Self-pay

## 2020-08-31 ENCOUNTER — Inpatient Hospital Stay (HOSPITAL_COMMUNITY)
Admission: AD | Admit: 2020-08-31 | Discharge: 2020-09-03 | DRG: 806 | Disposition: A | Discharge status: Home / Self Care | Payer: Medicaid Other | Attending: Obstetrics and Gynecology | Admitting: Obstetrics and Gynecology

## 2020-08-31 ENCOUNTER — Encounter (HOSPITAL_COMMUNITY): Payer: Self-pay | Admitting: Obstetrics and Gynecology

## 2020-08-31 DIAGNOSIS — Z20822 Contact with and (suspected) exposure to covid-19: Secondary | ICD-10-CM | POA: Diagnosis present

## 2020-08-31 DIAGNOSIS — O26893 Other specified pregnancy related conditions, third trimester: Secondary | ICD-10-CM | POA: Diagnosis present

## 2020-08-31 DIAGNOSIS — Z3A36 36 weeks gestation of pregnancy: Secondary | ICD-10-CM

## 2020-08-31 DIAGNOSIS — Z87891 Personal history of nicotine dependence: Secondary | ICD-10-CM

## 2020-08-31 DIAGNOSIS — Z6791 Unspecified blood type, Rh negative: Secondary | ICD-10-CM

## 2020-08-31 DIAGNOSIS — Z3490 Encounter for supervision of normal pregnancy, unspecified, unspecified trimester: Secondary | ICD-10-CM

## 2020-08-31 DIAGNOSIS — O99824 Streptococcus B carrier state complicating childbirth: Secondary | ICD-10-CM | POA: Diagnosis present

## 2020-08-31 DIAGNOSIS — O99324 Drug use complicating childbirth: Secondary | ICD-10-CM | POA: Diagnosis present

## 2020-08-31 DIAGNOSIS — F1290 Cannabis use, unspecified, uncomplicated: Secondary | ICD-10-CM | POA: Diagnosis present

## 2020-08-31 HISTORY — DX: Umbilical hernia without obstruction or gangrene: K42.9

## 2020-08-31 LAB — CBC
HCT: 35.9 % — ABNORMAL LOW (ref 36.0–46.0)
Hemoglobin: 11.7 g/dL — ABNORMAL LOW (ref 12.0–15.0)
MCH: 29.7 pg (ref 26.0–34.0)
MCHC: 32.6 g/dL (ref 30.0–36.0)
MCV: 91.1 fL (ref 80.0–100.0)
Platelets: 166 10*3/uL (ref 150–400)
RBC: 3.94 MIL/uL (ref 3.87–5.11)
RDW: 12.3 % (ref 11.5–15.5)
WBC: 13.4 10*3/uL — ABNORMAL HIGH (ref 4.0–10.5)
nRBC: 0 % (ref 0.0–0.2)

## 2020-08-31 LAB — RESPIRATORY PANEL BY RT PCR (FLU A&B, COVID)
Influenza A by PCR: NEGATIVE
Influenza B by PCR: NEGATIVE
SARS Coronavirus 2 by RT PCR: NEGATIVE

## 2020-08-31 LAB — RAPID URINE DRUG SCREEN, HOSP PERFORMED
Amphetamines: NOT DETECTED
Barbiturates: NOT DETECTED
Benzodiazepines: NOT DETECTED
Cocaine: NOT DETECTED
Opiates: NOT DETECTED
Tetrahydrocannabinol: POSITIVE — AB

## 2020-08-31 LAB — TYPE AND SCREEN
ABO/RH(D): O NEG
Antibody Screen: POSITIVE

## 2020-08-31 MED ORDER — OXYCODONE-ACETAMINOPHEN 5-325 MG PO TABS: 1.0000 | ORAL_TABLET | ORAL | Status: DC | PRN

## 2020-08-31 MED ORDER — OXYCODONE-ACETAMINOPHEN 5-325 MG PO TABS: 2.0000 | ORAL_TABLET | ORAL | Status: DC | PRN

## 2020-08-31 MED ORDER — FENTANYL CITRATE (PF) 100 MCG/2ML IJ SOLN
50.0000 ug | Freq: Once | INTRAMUSCULAR | Status: AC
  Administered 2020-08-31: 50 ug via INTRAVENOUS

## 2020-08-31 MED ORDER — LACTATED RINGERS IV SOLN: 500.0000 mL | INTRAVENOUS | Status: DC | PRN

## 2020-08-31 MED ORDER — ONDANSETRON HCL 4 MG/2ML IJ SOLN: 4.0000 mg | Freq: Four times a day (QID) | INTRAMUSCULAR | Status: DC | PRN

## 2020-08-31 MED ORDER — PHENYLEPHRINE 40 MCG/ML (10ML) SYRINGE FOR IV PUSH (FOR BLOOD PRESSURE SUPPORT)
PREFILLED_SYRINGE | INTRAVENOUS | Status: AC
  Filled 2020-08-31: qty 10

## 2020-08-31 MED ORDER — FENTANYL CITRATE (PF) 2500 MCG/50ML IJ SOLN
INTRAMUSCULAR | Status: DC | PRN
Start: 2020-08-31 — End: 2020-09-01
  Administered 2020-08-31: 12 mL/h via EPIDURAL

## 2020-08-31 MED ORDER — LIDOCAINE HCL (PF) 1 % IJ SOLN: 30.0000 mL | INTRAMUSCULAR | Status: DC | PRN

## 2020-08-31 MED ORDER — PHENYLEPHRINE 40 MCG/ML (10ML) SYRINGE FOR IV PUSH (FOR BLOOD PRESSURE SUPPORT): 80.0000 ug | PREFILLED_SYRINGE | INTRAVENOUS | Status: DC | PRN

## 2020-08-31 MED ORDER — DIPHENHYDRAMINE HCL 50 MG/ML IJ SOLN: 12.5000 mg | INTRAMUSCULAR | Status: DC | PRN

## 2020-08-31 MED ORDER — FENTANYL CITRATE (PF) 100 MCG/2ML IJ SOLN
INTRAMUSCULAR | Status: AC
  Filled 2020-08-31: qty 2

## 2020-08-31 MED ORDER — LACTATED RINGERS IV SOLN: INTRAVENOUS | Status: DC

## 2020-08-31 MED ORDER — FENTANYL-BUPIVACAINE-NACL 0.5-0.125-0.9 MG/250ML-% EP SOLN: 12.0000 mL/h | EPIDURAL | Status: DC | PRN

## 2020-08-31 MED ORDER — ACETAMINOPHEN 325 MG PO TABS: 650.0000 mg | ORAL_TABLET | ORAL | Status: DC | PRN

## 2020-08-31 MED ORDER — EPHEDRINE 5 MG/ML INJ: 10.0000 mg | INTRAVENOUS | Status: DC | PRN

## 2020-08-31 MED ORDER — PHENYLEPHRINE 40 MCG/ML (10ML) SYRINGE FOR IV PUSH (FOR BLOOD PRESSURE SUPPORT)
80.0000 ug | PREFILLED_SYRINGE | INTRAVENOUS | Status: DC | PRN
  Administered 2020-08-31: 80 ug via INTRAVENOUS

## 2020-08-31 MED ORDER — OXYTOCIN-SODIUM CHLORIDE 30-0.9 UT/500ML-% IV SOLN
2.5000 [IU]/h | INTRAVENOUS | Status: DC
  Filled 2020-08-31: qty 500

## 2020-08-31 MED ORDER — SOD CITRATE-CITRIC ACID 500-334 MG/5ML PO SOLN: 30.0000 mL | ORAL | Status: DC | PRN

## 2020-08-31 MED ORDER — FENTANYL-BUPIVACAINE-NACL 0.5-0.125-0.9 MG/250ML-% EP SOLN
EPIDURAL | Status: AC
Start: 2020-08-31 — End: 2020-09-01
  Filled 2020-08-31: qty 250

## 2020-08-31 MED ORDER — OXYTOCIN BOLUS FROM INFUSION
333.0000 mL | Freq: Once | INTRAVENOUS | Status: AC
  Administered 2020-09-01: 333 mL via INTRAVENOUS

## 2020-08-31 MED ORDER — FLEET ENEMA 7-19 GM/118ML RE ENEM: 1.0000 | ENEMA | RECTAL | Status: DC | PRN

## 2020-08-31 MED ORDER — PENICILLIN G POT IN DEXTROSE 60000 UNIT/ML IV SOLN
3.0000 10*6.[IU] | INTRAVENOUS | Status: DC
  Administered 2020-09-01 (×3): 3 10*6.[IU] via INTRAVENOUS
  Filled 2020-08-31 (×3): qty 50

## 2020-08-31 MED ORDER — LACTATED RINGERS IV SOLN: 500.0000 mL | Freq: Once | INTRAVENOUS | Status: DC

## 2020-08-31 MED ORDER — BETAMETHASONE SOD PHOS & ACET 6 (3-3) MG/ML IJ SUSP
12.0000 mg | Freq: Once | INTRAMUSCULAR | Status: AC
  Administered 2020-08-31: 12 mg via INTRAMUSCULAR
  Filled 2020-08-31: qty 5

## 2020-08-31 MED ORDER — LIDOCAINE HCL (PF) 1 % IJ SOLN
INTRAMUSCULAR | Status: DC | PRN
  Administered 2020-08-31: 10 mL via EPIDURAL

## 2020-08-31 MED ORDER — SODIUM CHLORIDE 0.9 % IV SOLN
5.0000 10*6.[IU] | Freq: Once | INTRAVENOUS | Status: AC
  Administered 2020-08-31: 5 10*6.[IU] via INTRAVENOUS
  Filled 2020-08-31: qty 5

## 2020-08-31 NOTE — Anesthesia Procedure Notes (Signed)
Epidural Patient location during procedure: OB Start time: 08/31/2020 8:15 PM End time: 08/31/2020 8:23 PM  Staffing Anesthesiologist: Lucretia Kern, MD Performed: anesthesiologist   Preanesthetic Checklist Completed: patient identified, IV checked, risks and benefits discussed, monitors and equipment checked, pre-op evaluation and timeout performed  Epidural Patient position: sitting Prep: DuraPrep Patient monitoring: heart rate, continuous pulse ox and blood pressure Approach: midline Location: L3-L4 Injection technique: LOR air  Needle:  Needle type: Tuohy  Needle gauge: 17 G Needle length: 9 cm Needle insertion depth: 6 cm Catheter type: closed end flexible Catheter size: 19 Gauge Catheter at skin depth: 11 cm Test dose: negative  Assessment Events: blood not aspirated, injection not painful, no injection resistance, no paresthesia and negative IV test  Additional Notes Reason for block:procedure for pain

## 2020-08-31 NOTE — Anesthesia Preprocedure Evaluation (Signed)
Anesthesia Evaluation  Patient identified by MRN, date of birth, ID band Patient awake    Reviewed: Allergy & Precautions, H&P , NPO status , Patient's Chart, lab work & pertinent test results  History of Anesthesia Complications Negative for: history of anesthetic complications  Airway Mallampati: II  TM Distance: >3 FB Neck ROM: full    Dental no notable dental hx.    Pulmonary neg pulmonary ROS, Patient abstained from smoking., former smoker,    Pulmonary exam normal        Cardiovascular negative cardio ROS Normal cardiovascular exam Rhythm:regular Rate:Normal     Neuro/Psych PSYCHIATRIC DISORDERS Anxiety negative neurological ROS     GI/Hepatic PUD, (+)     substance abuse  marijuana use,   Endo/Other  negative endocrine ROS  Renal/GU negative Renal ROS  negative genitourinary   Musculoskeletal  (+) narcotic dependent  Abdominal   Peds  Hematology negative hematology ROS (+)   Anesthesia Other Findings  Denies history of bleeding disorders, easy bleeding or bruising, diagnosis of hepatitis, use of   Reproductive/Obstetrics (+) Pregnancy                             Anesthesia Physical Anesthesia Plan  ASA: III  Anesthesia Plan: Epidural   Post-op Pain Management:    Induction:   PONV Risk Score and Plan:   Airway Management Planned:   Additional Equipment:   Intra-op Plan:   Post-operative Plan:   Informed Consent: I have reviewed the patients History and Physical, chart, labs and discussed the procedure including the risks, benefits and alternatives for the proposed anesthesia with the patient or authorized representative who has indicated his/her understanding and acceptance.       Plan Discussed with:   Anesthesia Plan Comments:         Anesthesia Quick Evaluation

## 2020-08-31 NOTE — MAU Note (Signed)
Pt reports ctx off and on since yesterday. Got stronger and closer today. Denies any vag bleeding or leaking at thus time. Good fetal movement felt,

## 2020-08-31 NOTE — H&P (Signed)
39 y.o. [redacted]w[redacted]d  G3P1011 comes in c/o contractions starting yesterday that have worsened.  Otherwise has good fetal movement and no bleeding.  Past Medical History:  Diagnosis Date  . Adjustment disorder with mixed disturbance of emotions and conduct   . Anxiety   . Dislocation of the knee cap    left 2018  . Hernia, umbilical   . Miscarriage   . Multiple gastric ulcers   . Opiate abuse, continuous (HCC)   . Ovarian cyst     Past Surgical History:  Procedure Laterality Date  . ANTERIOR CRUCIATE LIGAMENT REPAIR Left 01/12/2018   Procedure: LEFT KNEE ANTERIOR CRUCIATE LIGAMENT (ACL) RECONSTRUCTION, PARTIAL MEDIAL MENISCECTOMY;  Surgeon: Cammy Copa, MD;  Location: [facility] OR;  Service: Orthopedics;  Laterality: Left;  . ESOPHAGOGASTRODUODENOSCOPY    . WISDOM TOOTH EXTRACTION      OB History  Gravida Para Term Preterm AB Living  3 1 1   1 1   SAB TAB Ectopic Multiple Live Births  1       1    # Outcome Date GA Lbr Len/2nd Weight Sex Delivery Anes PTL Lv  3 Current           2 Term 2008     Vag-Spont   LIV  1 SAB             Social History   Socioeconomic History  . Marital status: Divorced    Spouse name: Not on file  . Number of children: Not on file  . Years of education: Not on file  . Highest education level: Not on file  Occupational History  . Not on file  Tobacco Use  . Smoking status: Former Smoker    Types: Cigarettes  . Smokeless tobacco: Never Used  Vaping Use  . Vaping Use: Never used  Substance and Sexual Activity  . Alcohol use: Not Currently    Comment: rarely  . Drug use: No    Comment: heroin (past)  . Sexual activity: Yes    Birth control/protection: None  Other Topics Concern  . Not on file  Social History Narrative  . Not on file   Social Determinants of Health   Financial Resource Strain:   . Difficulty of Paying Living Expenses: Not on file  Food Insecurity:   . Worried About 2009 in the Last Year: Not on file  . Ran  Out of Food in the Last Year: Not on file  Transportation Needs:   . Lack of Transportation (Medical): Not on file  . Lack of Transportation (Non-Medical): Not on file  Physical Activity:   . Days of Exercise per Week: Not on file  . Minutes of Exercise per Session: Not on file  Stress:   . Feeling of Stress : Not on file  Social Connections:   . Frequency of Communication with Friends and Family: Not on file  . Frequency of Social Gatherings with Friends and Family: Not on file  . Attends Religious Services: Not on file  . Active Member of Clubs or Organizations: Not on file  . Attends Programme researcher, broadcasting/film/video Meetings: Not on file  . Marital Status: Not on file  Intimate Partner Violence:   . Fear of Current or Ex-Partner: Not on file  . Emotionally Abused: Not on file  . Physically Abused: Not on file  . Sexually Abused: Not on file   Patient has no known allergies.    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  Lungs/Cor:  NAD Abdomen:  soft, gravid Ex:  no cords, erythema SVE:  5/100/0 FHTs:  145, good STV, NST R; Cat 1 tracing. Toco:  q 2-4   A/P   Admit with near term labor @ 36.5  GBS Pos - PCN  SW consult postpartum for possible h.o opiate abuse, marijuana use in pregnancy, homelessness in pregnancy Epidural when desired, RN will confirm history of opiate abuse, if positive would avoid IV pain meds if  possible. Other routine care   Philip Aspen

## 2020-09-01 ENCOUNTER — Encounter (HOSPITAL_COMMUNITY): Payer: Self-pay | Admitting: Obstetrics and Gynecology

## 2020-09-01 DIAGNOSIS — Z3490 Encounter for supervision of normal pregnancy, unspecified, unspecified trimester: Secondary | ICD-10-CM

## 2020-09-01 LAB — RPR: RPR Ser Ql: NONREACTIVE

## 2020-09-01 MED ORDER — BENZOCAINE-MENTHOL 20-0.5 % EX AERO
1.0000 "application " | INHALATION_SPRAY | CUTANEOUS | Status: DC | PRN
  Administered 2020-09-03: 1 via TOPICAL
  Filled 2020-09-01: qty 56

## 2020-09-01 MED ORDER — ONDANSETRON HCL 4 MG/2ML IJ SOLN
4.0000 mg | INTRAMUSCULAR | Status: DC | PRN
  Administered 2020-09-02: 4 mg via INTRAVENOUS
  Filled 2020-09-01: qty 2

## 2020-09-01 MED ORDER — COCONUT OIL OIL: 1.0000 "application " | TOPICAL_OIL | Status: DC | PRN

## 2020-09-01 MED ORDER — ACETAMINOPHEN 325 MG PO TABS
650.0000 mg | ORAL_TABLET | ORAL | Status: DC | PRN
  Administered 2020-09-01 – 2020-09-02 (×4): 650 mg via ORAL
  Filled 2020-09-01 (×6): qty 2

## 2020-09-01 MED ORDER — ONDANSETRON HCL 4 MG PO TABS: 4.0000 mg | ORAL_TABLET | ORAL | Status: DC | PRN

## 2020-09-01 MED ORDER — SIMETHICONE 80 MG PO CHEW: 80.0000 mg | CHEWABLE_TABLET | ORAL | Status: DC | PRN

## 2020-09-01 MED ORDER — SENNOSIDES-DOCUSATE SODIUM 8.6-50 MG PO TABS
2.0000 | ORAL_TABLET | ORAL | Status: DC
  Administered 2020-09-02: 2 via ORAL
  Filled 2020-09-01 (×2): qty 2

## 2020-09-01 MED ORDER — DIBUCAINE (PERIANAL) 1 % EX OINT: 1.0000 "application " | TOPICAL_OINTMENT | CUTANEOUS | Status: DC | PRN

## 2020-09-01 MED ORDER — BETAMETHASONE SOD PHOS & ACET 6 (3-3) MG/ML IJ SUSP
12.0000 mg | Freq: Once | INTRAMUSCULAR | Status: DC
  Filled 2020-09-01: qty 2

## 2020-09-01 MED ORDER — TETANUS-DIPHTH-ACELL PERTUSSIS 5-2.5-18.5 LF-MCG/0.5 IM SUSP: 0.5000 mL | Freq: Once | INTRAMUSCULAR | Status: DC

## 2020-09-01 MED ORDER — MEASLES, MUMPS & RUBELLA VAC IJ SOLR: 0.5000 mL | Freq: Once | INTRAMUSCULAR | Status: DC

## 2020-09-01 MED ORDER — ZOLPIDEM TARTRATE 5 MG PO TABS: 5.0000 mg | ORAL_TABLET | Freq: Every evening | ORAL | Status: DC | PRN

## 2020-09-01 MED ORDER — PRENATAL MULTIVITAMIN CH
1.0000 | ORAL_TABLET | Freq: Every day | ORAL | Status: DC
  Administered 2020-09-01: 1 via ORAL
  Filled 2020-09-01 (×2): qty 1

## 2020-09-01 MED ORDER — WITCH HAZEL-GLYCERIN EX PADS: 1.0000 "application " | MEDICATED_PAD | CUTANEOUS | Status: DC | PRN

## 2020-09-01 MED ORDER — IBUPROFEN 600 MG PO TABS
600.0000 mg | ORAL_TABLET | Freq: Four times a day (QID) | ORAL | Status: DC
  Administered 2020-09-01 – 2020-09-02 (×4): 600 mg via ORAL
  Filled 2020-09-01 (×6): qty 1

## 2020-09-01 NOTE — Progress Notes (Signed)
Patient sleeping comfortably, no complaints. FHT: 130 mod var +accels, no decels TOCO: currently not picking up, RN to adjust SVE: 7-8/90/-2, not well applied head A/P: preterm labor at 36.6 S/p beta x 1, will administer second dose if undelivered by 24 hrs after first PCN for GBS, now adequate treatment Was going to [facility], but head not well applied and minimal cervical change since last check.  Its possible FLM could benefit from limiting augmentation and monitoring for now. FHT are Cat 1

## 2020-09-02 LAB — CBC
HCT: 34.3 % — ABNORMAL LOW (ref 36.0–46.0)
Hemoglobin: 11.7 g/dL — ABNORMAL LOW (ref 12.0–15.0)
MCH: 30.5 pg (ref 26.0–34.0)
MCHC: 34.1 g/dL (ref 30.0–36.0)
MCV: 89.3 fL (ref 80.0–100.0)
Platelets: 181 10*3/uL (ref 150–400)
RBC: 3.84 MIL/uL — ABNORMAL LOW (ref 3.87–5.11)
RDW: 12.8 % (ref 11.5–15.5)
WBC: 14.9 10*3/uL — ABNORMAL HIGH (ref 4.0–10.5)
nRBC: 0 % (ref 0.0–0.2)

## 2020-09-02 MED ORDER — KETOROLAC TROMETHAMINE 30 MG/ML IJ SOLN
30.0000 mg | Freq: Once | INTRAMUSCULAR | Status: AC
  Administered 2020-09-02: 30 mg via INTRAVENOUS
  Filled 2020-09-02: qty 1

## 2020-09-02 NOTE — Progress Notes (Signed)
Post Partum Day 1 Subjective: up ad lib, voiding, tolerating PO, + flatus and complaining of lower abdominal cramping   Objective: Patient Vitals for the past 24 hrs:  BP Temp Temp src Pulse Resp SpO2  09/02/20 0545 135/85 97.8 F (36.6 C) Oral 65 18 99 %  09/02/20 0008 132/70 97.9 F (36.6 C) Oral 61 17 98 %  09/01/20 2100 127/69 98.5 F (36.9 C) Oral (!) 58 18 96 %  09/01/20 1547 121/76 98.7 F (37.1 C) Oral 66 17 99 %  09/01/20 1200 127/74 98.4 F (36.9 C) Oral 67 17 98 %  09/01/20 1057 128/78 98.7 F (37.1 C) -- 64 -- 98 %  09/01/20 1031 (!) 143/79 -- -- 69 18 --  09/01/20 1016 137/78 -- -- 62 18 --  09/01/20 1001 (!) 141/82 -- -- 67 -- --  09/01/20 0946 123/62 -- -- 64 18 --    Physical Exam:  General: alert, cooperative and no distress Lochia: appropriate Uterine Fundus: firm DVT Evaluation: No evidence of DVT seen on physical exam.  Recent Labs    08/31/20 1917 09/02/20 0523  WBC 13.4* 14.9*  HGB 11.7* 11.7*  HCT 35.9* 34.3*  PLT 166 181    No results for input(s): NA, K, CL, CO2CT, BUN, CREATININE, GLUCOSE, BILITOT, ALT, AST, ALKPHOS, PROT, ALBUMIN in the last 72 hours.  No results for input(s): CALCIUM, MG, PHOS in the last 72 hours.  No results for input(s): PROTIME, APTT, INR in the last 72 hours.  No results for input(s): PROTIME, APTT, INR, FIBRINOGEN in the last 72 hours. Assessment/Plan: Plan for discharge tomorrow and Social Work consult  Debbie Griffin 38 y.o. [id number] PPD#1 sp SVD @ 36.6 1. PPC: cont routine postpartum care 2. SW consult 3. Rh neg: follow up baby ABORH  LOS: 2 days   Debbie Griffin 09/02/2020, 9:43 AM

## 2020-09-02 NOTE — Anesthesia Postprocedure Evaluation (Signed)
Anesthesia Post Note  Patient: Mahogani Skidgel  Procedure(s) Performed: AN AD HOC LABOR EPIDURAL     Patient location during evaluation: Mother Baby Anesthesia Type: Epidural Level of consciousness: awake, awake and alert and oriented Pain management: pain level controlled Vital Signs Assessment: post-procedure vital signs reviewed and stable Respiratory status: spontaneous breathing, nonlabored ventilation and respiratory function stable Cardiovascular status: stable Postop Assessment: no headache, patient able to bend at knees, no apparent nausea or vomiting, no backache, adequate PO intake and able to ambulate Anesthetic complications: no   No complications documented.  Last Vitals:  Vitals:   09/02/20 0008 09/02/20 0545  BP: 132/70 135/85  Pulse: 61 65  Resp: 17 18  Temp: 36.6 C 36.6 C  SpO2: 98% 99%    Last Pain:  Vitals:   09/02/20 0545  TempSrc: Oral  PainSc:    Pain Goal: Patients Stated Pain Goal: 1 (09/02/20 0008)                 Ceci Taliaferro

## 2020-09-02 NOTE — Anesthesia Postprocedure Evaluation (Signed)
Anesthesia Post Note  Patient: Debbie Griffin  Procedure(s) Performed: AN AD HOC LABOR EPIDURAL     Anesthesia Type: Epidural Anesthetic complications: no   No complications documented.  Last Vitals:  Vitals:   09/02/20 0008 09/02/20 0545  BP: 132/70 135/85  Pulse: 61 65  Resp: 17 18  Temp: 36.6 C 36.6 C  SpO2: 98% 99%    Last Pain:  Vitals:   09/02/20 0545  TempSrc: Oral  PainSc:    Pain Goal: Patients Stated Pain Goal: 1 (09/02/20 0008)                 Jaymi Tinner

## 2020-09-02 NOTE — Progress Notes (Signed)
Notified Dr. Timothy Lasso in regards to patient requesting something stronger for her abdomen and back pain. New order received.

## 2020-09-02 NOTE — Lactation Note (Addendum)
This note was copied from a baby's chart. Lactation Consultation Note  Patient Name: Debbie Griffin [id number]'G Date: 09/02/2020 Reason for consult: Follow-up assessment;Late-preterm 34-36.6wks;Hyperbilirubinemia   P2, Baby 29 hours old and on phototherapy. [redacted]w[redacted]d. Mother breastfed first child for 9 mos.  He is now 37 years old.  Baby cueing with loud, high pitched cry.  Suggest mother latch baby.  Baby latched easily in cradle hold with intermittent swallows. Set up DEBP.  Mother has DEBP at home.  Mom made aware of O/P services, breastfeeding support groups, community resources, and our phone # for post-discharge questions.   Plan: 1. Offer breast when baby cues that he/she is hungry, or awaken baby for feeding at 3 hrs. 2.  Breast feed baby, asking for help prn.  Limit to 30 mins so not to overtire baby. 3.  Pump both breasts 15-20 minutes on initiation setting, adding breast massage and hand expression to collect as much colostrum as possible to feed baby after feeding.  4.  Feed baby volume pumped after breastfeeding per LPTI volume guidelines increasing per day of life and as baby desires.      Maternal Data Does the patient have breastfeeding experience prior to this delivery?: Yes  Feeding Feeding Type: Breast Fed  LATCH Score Latch: Grasps breast easily, tongue down, lips flanged, rhythmical sucking.  Audible Swallowing: A few with stimulation  Type of Nipple: Everted at rest and after stimulation  Comfort (Breast/Nipple): Soft / non-tender  Hold (Positioning): Assistance needed to correctly position infant at breast and maintain latch.  LATCH Score: 8  Interventions Interventions: Breast feeding basics reviewed;Skin to skin;DEBP  Lactation Tools Discussed/Used     Consult Status Consult Status: Follow-up Date: 09/03/20 Follow-up type: In-patient    Dahlia Byes [facility] 09/02/2020, 2:19 PM

## 2020-09-02 NOTE — Progress Notes (Signed)
I asked mom about the infant's cotton balls, and she said "the baby had pooped all over them, so I threw them out." I explained to her that I would put a new set of cotton balls in her diaper, and she needed to call when infant had a wet diaper. Mom stated "ok".

## 2020-09-02 NOTE — Progress Notes (Signed)
I walked into room to do mom's morning vital signs, and dad had the baby laying on counter, changing her diaper. I saw the cotton balls laying on counter top. I asked dad if they were wet from urine and he said no. I checked and the cotton balls were dry. I then asked about infant's I&O's for remaining shift, and dad replies that he just changed both a wet and poop. He said he didn't know why the cotton balls weren't wet. Mom spoke up and said "yes that is strange". I placed the cotton balls in infant's diaper and told the parents that they needed to stay in infant's diaper until they are wet. The parents said "ok".

## 2020-09-02 NOTE — Clinical Social Work Maternal (Signed)
CLINICAL SOCIAL WORK MATERNAL/CHILD NOTE  Patient Details  Name: Debbie Griffin MRN: [id number] Date of Birth: 10/28/1981  Date:  09/02/2020  Clinical Social Worker Initiating Note:  Lonzo Saulter, LCSWA Date/Time: Initiated:  09/02/20/0130     Child's Name:  Debbie Griffin   Biological Parents:  Mother   Need for Interpreter:  None   Reason for Referral:  Current Substance Use/Substance Use During Pregnancy , Behavioral Health Concerns   Address:  [address] [place] [address]    Phone number:  [phone] (home)     Additional phone number:   Household Members/Support Persons (HM/SP):   Household Member/Support Person 1   HM/SP Name Relationship DOB or Age  HM/SP -1 Adam Griffin Boyfriend    HM/SP -2        HM/SP -3        HM/SP -4        HM/SP -5        HM/SP -6        HM/SP -7        HM/SP -8          Natural Supports (not living in the home):  Extended Family, Immediate Family   Professional Supports: Therapist   Employment: Unemployed   Type of Work:     Education:  Some College   Homebound arranged:    Financial Resources:  Medicaid   Other Resources:  WIC   Cultural/Religious Considerations Which May Impact Care:    Strengths:  Pediatrician chosen, Home prepared for child    Psychotropic Medications:         Pediatrician:    [place] area  Pediatrician List:    [facility]  [place]    [place]    [place]    [place]    [place]      Pediatrician Fax Number:    Risk Factors/Current Problems:  Substance Use , Mental Health Concerns    Cognitive State:  Alert    Mood/Affect:  Calm , Flat , Blunted    CSW Assessment: CSW met with MOB alone and introduced self and role. CSW observed MOB breast feeding baby. MOB was in agreement to speak with CSW and complete assessment. MOB stated she resides at the address on file with FOB Adam Griffin. MOB stated she has a 13-year  old son who is currently with his father in [place]. MOB stated she shares custody of son. CSW asked MOB about her mental health history. MOB initially stated she does not have any mental health history. CSW asked MOB if the medical record history of anxiety is inaccurate. MOB stated she does have a history of anxiety and was diagnosed 5 years ago. MOB stated she has never taken any mediations for the diagnosis. MOB stated she attended therapy in the past, but has not went in the last few months. CSW asked MOB if she recalls where she went to therapy, she stated no. MOB acknowledged therapy was helpful. MOB denied any current SI, HI or being involved in a DV relationship. CSW asked MOB how she copes with her anxiety. MOB expressed she just learns to deal with it. MOB stated she is currently feeling pretty good.  CSW provided education regarding the baby blues period vs. perinatal mood disorders, discussed treatment and gave resources for mental health follow up if concerns arise.  CSW recommends self-evaluation during the postpartum time period using the New Mom Checklist from Postpartum Progress and encouraged MOB to   contact a medical professional if symptoms are noted at any time.   CSW provided review of Sudden Infant Death Syndrome (SIDS) precautions.  MOB stated baby will sleep in a crib once discharged home.  MOB expressed she has all of the essential needs for baby to discharge home, including a brand new carseat. MOB stated baby will received follow-up care at [facility] and denies having any transportation issues.   CSW asked MOB about substance use history. MOB disclosed she has a history of opiod use which ended a couple of years ago. CSW informed MOB of positive UDS for THC. MOB recalled that she last used marijuana a couple of weeks ago. MOB denied any additional substance use. CSW informed MOB of hospital drug screen policy. CSW informed MOB an UDS and CDS will be completed on baby.  CSW informed MOB that a CPS report will be required if baby test positive for any substances. MOB expressed understanding and denied having any additional questions about the drug screen policy. MOB denied having any current or previous CPS history.  UDS for baby resulted negative. CSW will continue to follow CDS and make a CPS report if warranted. MOB denies any additional needs at this time.   CSW identifies no further need for intervention and no barriers to discharge at this time.  Kristoff Coonradt, LCSWA [facility]   CSW Plan/Description:  No Further Intervention Required/No Barriers to Discharge, Sudden Infant Death Syndrome (SIDS) Education, Perinatal Mood and Anxiety Disorder (PMADs) Education, Hospital Drug Screen Policy Information, CSW Will Continue to Monitor Umbilical Cord Tissue Drug Screen Results and Make Report if Warranted    Raylyn Speckman J Ryla Cauthon, LCSWA 09/02/2020, 2:00 PM   

## 2020-09-03 MED ORDER — PRENATAL VITAMIN 27-0.8 MG PO TABS: 1.0000 | ORAL_TABLET | Freq: Every day | ORAL | 3 refills | Status: DC

## 2020-09-03 NOTE — Lactation Note (Signed)
This note was copied from a baby's chart. Lactation Consultation Note  Patient Name: Debbie Griffin [id number] Date: 09/03/2020 Reason for consult: Follow-up assessment;NICU baby;Infant weight loss;Late-preterm 34-36.6wks  History of opiate abuse (2 yrs ago)    LC in to consult with P2 Mom of LPTI transferred to NICU at 15 hrs old due to increased irritability, tachypnea, poor feedings.  Baby at 10.2 % weight loss at 44 hrs old.    Baby being gavage fed formula and attempting bottle feeding. Baby on Sim Total Comfort formula.  LC noted that Mom was sound asleep.  Pump set up at bedside.  Talked with her RN and she said Mom was medicated for pain.    Will follow-up at later time to talk about importance of pumping to provide her breastmilk.  Cord screen pending.     Lactation Tools Discussed/Used Tools: Pump Breast pump type: Double-Electric Breast Pump   Consult Status Consult Status: Follow-up Date: 09/03/20 Follow-up type: In-patient    Debbie Griffin 09/03/2020, 9:55 AM

## 2020-09-03 NOTE — Discharge Summary (Signed)
Postpartum Discharge Summary     Patient Name: Debbie Griffin DOB: November 11, 1981 MRN: [id number]  Date of admission: 08/31/2020 Delivery date:09/01/2020  Delivering provider: Freda Munro E  Date of discharge: 09/03/2020  Admitting diagnosis: Normal labor [O80, Z37.9] Pregnancy [Z34.90] Intrauterine pregnancy: [redacted]w[redacted]d     Secondary diagnosis:  Active Problems:   Normal labor   Pregnancy  Additional problems: history of opiate abuse, marijuana use in pregnancy, AMA, umbilical hernia, smoker, trichomoniasis in pregnancy     Discharge diagnosis: Preterm Pregnancy Delivered                                              Post partum procedures:none Augmentation: N/A Complications: None  Hospital course: Onset of Labor With Vaginal Delivery      39 y.o. yo [id number] at [redacted]w[redacted]d was admitted in Latent Labor on 08/31/2020. Patient had an uncomplicated labor course as follows:  Membrane Rupture Time/Date: 6:00 AM ,09/01/2020   Delivery Method:Vaginal, Spontaneous  Episiotomy: None  Lacerations:  Labial  Patient had an uncomplicated postpartum course.  She is ambulating, tolerating a regular diet, passing flatus, and urinating well. Patient is discharged home in stable condition on 09/03/20.  Newborn Data: Birth date:09/01/2020  Birth time:9:13 AM  Gender:Female  Living status:Living  Apgars:9 ,9  Weight:3229 g   Magnesium Sulfate received: No BMZ received: Yes Rhophylac:Yes MMR:N/A T-DaP:see office note Flu: N/A Transfusion:No  Physical exam  Vitals:   09/02/20 0545 09/02/20 1440 09/02/20 2006 09/03/20 0555  BP: 135/85 (!) 148/80 122/85 129/73  Pulse: 65 63 60 (!) 42  Resp: $Remo'18 16 15 16  'eVSWo$ Temp: 97.8 F (36.6 C) 99.2 F (37.3 C) 99 F (37.2 C) 98.7 F (37.1 C)  TempSrc: Oral Oral Oral Oral  SpO2: 99% 99% 99% 99%  Weight:      Height:       General: alert, cooperative and no distress Lochia: appropriate Uterine Fundus: firm DVT Evaluation: No evidence of DVT seen on  physical exam. Labs: Lab Results  Component Value Date   WBC 14.9 (H) 09/02/2020   HGB 11.7 (L) 09/02/2020   HCT 34.3 (L) 09/02/2020   MCV 89.3 09/02/2020   PLT 181 09/02/2020   CMP Latest Ref Rng & Units 05/22/2020  Glucose 70 - 99 mg/dL 87  BUN 6 - 20 mg/dL 7  Creatinine 0.44 - 1.00 mg/dL 0.68  Sodium 135 - 145 mmol/L 137  Potassium 3.5 - 5.1 mmol/L 3.7  Chloride 98 - 111 mmol/L 105  CO2 22 - 32 mmol/L 22  Calcium 8.9 - 10.3 mg/dL 8.7(L)  Total Protein 6.5 - 8.1 g/dL 6.2(L)  Total Bilirubin 0.3 - 1.2 mg/dL 0.8  Alkaline Phos 38 - 126 U/L 51  AST 15 - 41 U/L 15  ALT 0 - 44 U/L 12   Edinburgh Score: Edinburgh Postnatal Depression Scale Screening Tool 09/03/2020  I have been able to laugh and see the funny side of things. 0  I have looked forward with enjoyment to things. 0  I have blamed myself unnecessarily when things went wrong. 2  I have been anxious or worried for no good reason. 2  I have felt scared or panicky for no good reason. 1  Things have been getting on top of me. 1  I have been so unhappy that I have had difficulty sleeping. 1  I have felt  sad or miserable. 0  I have been so unhappy that I have been crying. 0  The thought of harming myself has occurred to me. 0  Edinburgh Postnatal Depression Scale Total 7      After visit meds:  Allergies as of 09/03/2020   No Known Allergies     Medication List    STOP taking these medications   oxyCODONE-acetaminophen 5-325 MG tablet Commonly known as: PERCOCET/ROXICET     TAKE these medications   Prenatal Vitamin 27-0.8 MG Tabs Take 1 tablet by mouth daily.        Discharge home in stable condition Infant Feeding: Bottle Infant Disposition:NICU Discharge instruction: per After Visit Summary and Postpartum booklet. Activity: Advance as tolerated. Pelvic rest for 6 weeks.  Diet: routine diet Anticipated Birth Control: Unsure Postpartum Appointment:4 weeks Additional Postpartum F/U: Postpartum  Depression checkup Future Appointments:No future appointments. Follow up Visit:  Follow-up Information    Olga Millers, MD Follow up in 4 week(s).   Specialty: Obstetrics and Gynecology Contact information: [address] [place] [address] [place] [address] [phone]                   09/03/2020 Allyn Kenner, DO

## 2020-09-04 ENCOUNTER — Ambulatory Visit: Payer: Self-pay

## 2020-09-04 NOTE — Lactation Note (Addendum)
This note was copied from a baby's chart. Lactation Consultation Note  Patient Name: Debbie Griffin [id number]'R Date: 09/04/2020 Reason for consult: Follow-up assessment;Other (Comment);Early term 82-38.6wks (NICU RN asked to see mom due to her milk being in possible engorgement)  Baby is 80 hours old , P 2,  LC visited mom in 19 and per mom milk came in this morning.  Mom showed the LC the 80 ml she last pumped off.  LC reviewed the importance of supply and demand / importance of being  Consistent with pumping now that her milk is in. Discussed prevention of engorgement and to protect milk supply by pumping 8-10 times a day and when necessary. LC reminded mom is she needs ice packs to ask her NICU RN.  Mom mentioned the baby had been latching and now being tube fed and using a bottle.  LC recommended if latching at the breast , could try giving the baby small appetizer and then latch. Whether baby latches at the breast or not to keep her pumping with the DEBP consistent.  Unable to assess feeding baby sound asleep and last fed at 1505.     Maternal Data    Feeding Feeding Type: Formula  LATCH Score Interventions Interventions: Breast feeding basics reviewed  Lactation Tools Discussed/Used     Consult Status Consult Status: Follow-up Date: 09/05/20 Follow-up type: In-patient    Debbie Griffin 09/04/2020, 5:33 PM

## 2020-09-06 ENCOUNTER — Ambulatory Visit: Payer: Self-pay

## 2020-09-06 NOTE — Lactation Note (Signed)
This note was copied from a baby's chart. Lactation Consultation Note  Patient Name: Debbie Griffin [id number] Date: 09/06/2020 Reason for consult: Follow-up assessment;NICU baby;Early term 84-38.6wks  Grandmother and dad holding infant in room.  Mom resting on sofa.  Grandmother left for the evening.  Mom states her breasts are very hard and milk volume has come in but she is pumping.  She has no concerns about pumping and declines the offer for LC to assist with pumping and or latching infant for the 6 pm feeding.    Mom states she would prefer to rest and requests a visit from lactation tomorrow.  She tells the [facility] she has two hard areas on the left breast that have softened with some pumping.    LC provided basic pumping information and encouraged mom to try to pump each time infant feeds or every 2-3 hours.   Maternal Data    Feeding Feeding Type: Formula Nipple Type: Nfant Extra Slow Flow (gold)  LATCH Score                   Interventions Interventions: Skin to skin;DEBP  Lactation Tools Discussed/Used     Consult Status Consult Status: Follow-up Date: 09/07/20 Follow-up type: In-patient    [name] [facility] 09/06/2020, 5:45 PM

## 2020-09-08 ENCOUNTER — Ambulatory Visit: Payer: Self-pay

## 2020-09-08 NOTE — Lactation Note (Signed)
This note was copied from a baby's chart. Lactation Consultation Note  Patient Name: Debbie Griffin CVUDT'H Date: 09/08/2020   LC attempted to follow up with [name]. She was lying down on the couch upon entry, resting. She asked that I return later.   Walker Shadow 09/08/2020, 2:24 PM

## 2020-09-10 ENCOUNTER — Ambulatory Visit: Payer: Self-pay

## 2020-09-10 NOTE — Lactation Note (Signed)
This note was copied from a baby's chart. Lactation Consultation Note  Patient Name: Girl Kymora Sciara MVEHM'C Date: 09/10/2020 Reason for consult: Follow-up assessment  LC Follow Up Visit:  Rounds in NICU:  Mother did not have any questions/concerns related to breast feeding.  Her daughter is now ad lib feeding.  Mother is pumping and had no questions regarding pumping.  She feels like baby is progressing nicely.  She will call for assistance as needed.   Maternal Data    Feeding Feeding Type: Breast Fed  LATCH Score Latch: Grasps breast easily, tongue down, lips flanged, rhythmical sucking.  Audible Swallowing: Spontaneous and intermittent  Type of Nipple: Everted at rest and after stimulation  Comfort (Breast/Nipple): Soft / non-tender  Hold (Positioning): No assistance needed to correctly position infant at breast.  LATCH Score: 10  Interventions    Lactation Tools Discussed/Used     Consult Status Consult Status: PRN Date: 09/10/20 Follow-up type: Call as needed    Irene Pap Momoko Slezak 09/10/2020, 5:36 PM

## 2020-09-11 ENCOUNTER — Ambulatory Visit: Payer: Self-pay

## 2020-09-11 NOTE — Lactation Note (Addendum)
This note was copied from a baby's chart. Lactation Consultation Note  Patient Name: Debbie Griffin [id number]'[id number] Date: 09/11/2020 Reason for consult: Follow-up assessment;NICU baby;Early term 37-38.6wks  LC in to visit with P2 Mom of AGA 38.2 week baby.  Baby 78 days old and feeding at the breast ad lib.  Mom had baby in cradle hold at the breast.  Offered to assist with a deeper latch to breast.  Pillow support placed under baby and support under Mom's arms to allow for relaxation.  Talked about the benefits of STS.  Baby fed for 18 mins on first breast before taking her off and switching to 2nd breast.  Nipple rounded with only slight pinching on edges, Mom denies any pain  Baby undressed and Mom removed her shirt to provide STS with baby and assisted Mom to use cross cradle hold to latch baby.  Mom wanting to let baby latch to nipple.  Assisted her to wait for a wide open mouth before bringing her quickly to breast understanding that baby will be able to transfer more milk with a deeper latch to breast.  Mom taught to use alternate breast compression during sucking bursts, swallow regularly identified.  Encouraged Mom to continue to pump after baby breast feeds.  Mom usually expresses 4-5 oz per pumping.  Left breast produces more milk and is more full.  Mom has been starting baby on left breast first to try to increase milk supply on left breast.   Talked about OP lactation support after discharge.  Mom is planning to get support from [facility] breastfeeding peer counselors.  Feeding Feeding Type: Breast Fed  LATCH Score Latch: Grasps breast easily, tongue down, lips flanged, rhythmical sucking.  Audible Swallowing: Spontaneous and intermittent  Type of Nipple: Everted at rest and after stimulation  Comfort (Breast/Nipple): Soft / non-tender  Hold (Positioning): Assistance needed to correctly position infant at breast and maintain latch.  LATCH Score: 9  Interventions Interventions:  Breast feeding basics reviewed;Assisted with latch;Skin to skin;Breast massage;Hand express;Breast compression;Adjust position;Support pillows;Position options;DEBP  Lactation Tools Discussed/Used Tools: Pump;Bottle Breast pump type: Double-Electric Breast Pump   Consult Status Consult Status: Follow-up Date: 09/12/20 Follow-up type: In-patient    Judee Clara 09/11/2020, 10:16 AM

## 2020-11-02 IMAGING — MR MR ABDOMEN W/O CM
7 of 8 series · 30 of 48 positions shown · non-contrast
Comparison: None.

CLINICAL DATA: Ventral hernia.  Abdominal pain.

EXAM:
MRI ABDOMEN WITHOUT CONTRAST
TECHNIQUE: Multiplanar multisequence MR imaging was performed without the
administration of intravenous contrast.

[Series 3: cor haste · coronal · 5.0mm · 1.04mm/px · 4 of 37 slices shown]
[im 1/37]
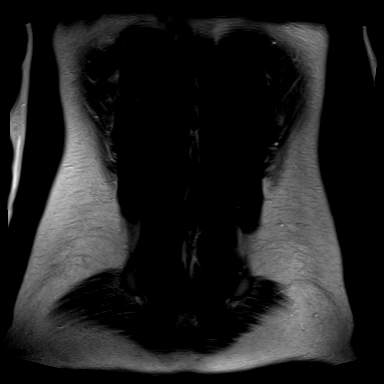
[im 13/37]
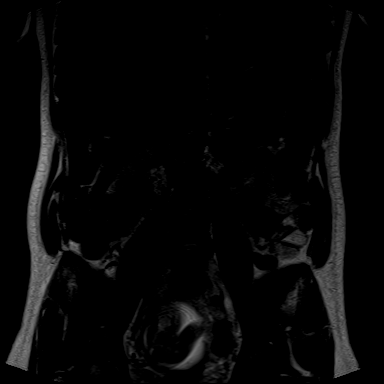
[im 25/37]
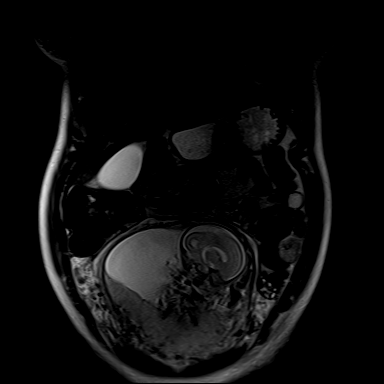
[im 37/37]
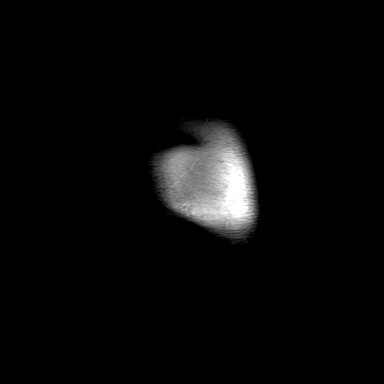

[Series 6: T2 fat-sat · axial · 5.0mm · 1.19mm/px · z∈[-227,+67]mm · 5 of 50 slices shown]
[im 1/50]
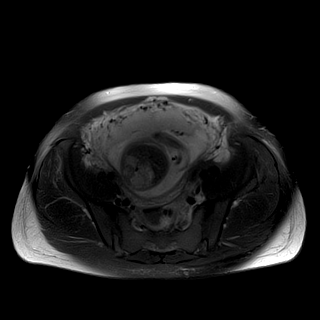
[im 13/50]
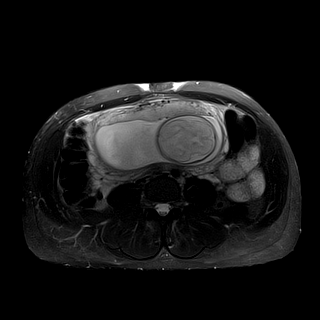
[im 25/50]
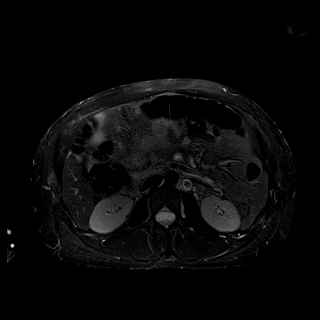
[im 37/50]
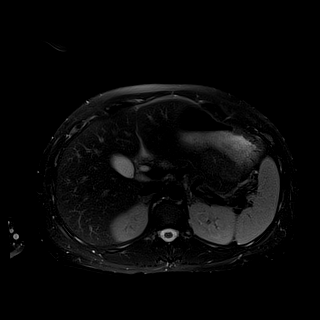
[im 50/50]
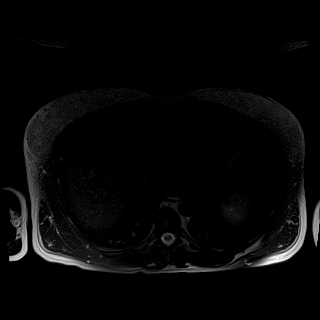

[Series 8: T1 · axial · 4.0mm · 0.70mm/px · z∈[-245,-34]mm · 4 of 45 slices shown]
[im 1/45]
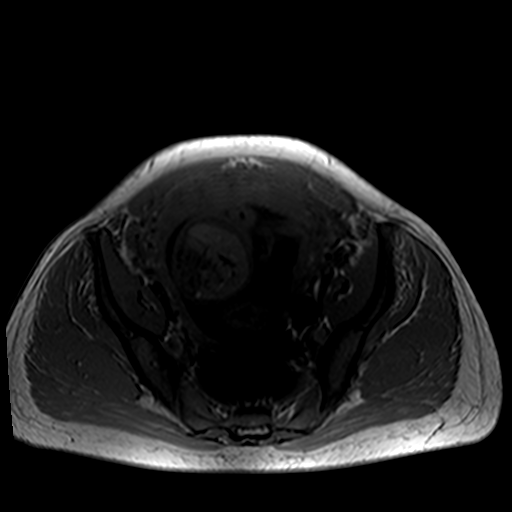
[im 15/45]
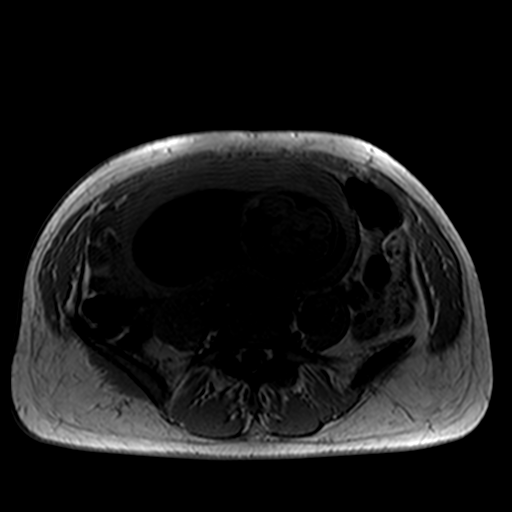
[im 30/45]
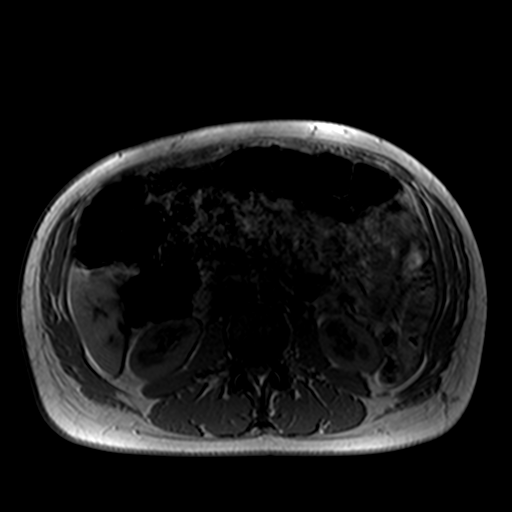
[im 45/45]
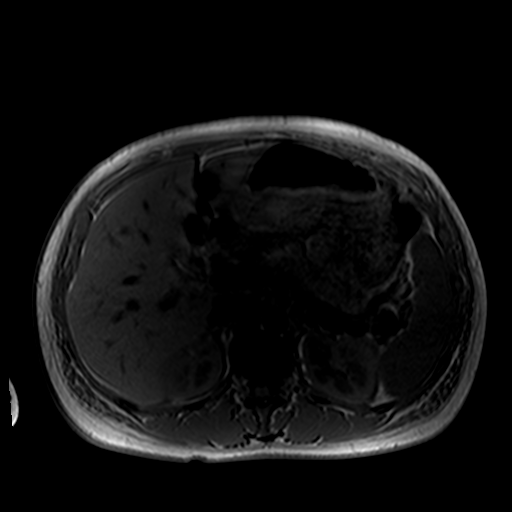

[Series 9: T1 fat-sat · axial · 4.0mm · 1.09mm/px · z∈[-253,-42]mm · 4 of 45 slices shown]
[im 1/45]
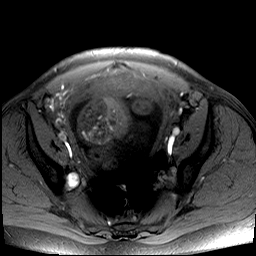
[im 15/45]
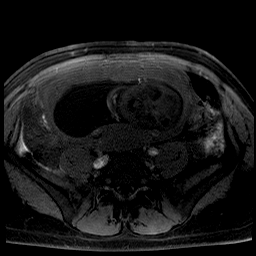
[im 30/45]
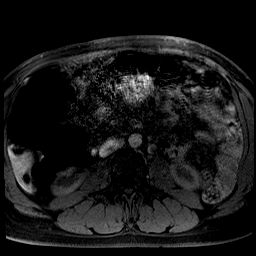
[im 45/45]
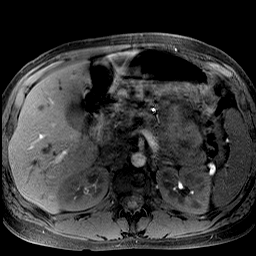

[Series 10: bSSFP · axial · 4.0mm · 0.74mm/px · z∈[-245,-34]mm · 4 of 45 slices shown]
[im 1/45]
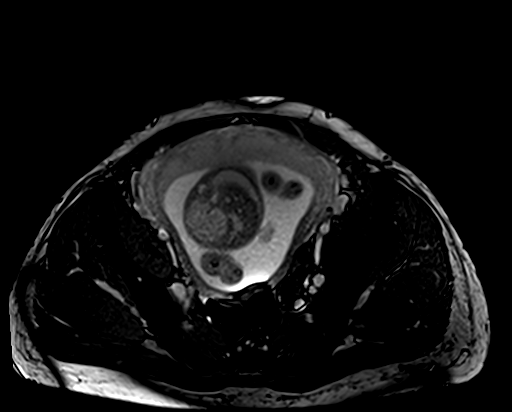
[im 15/45]
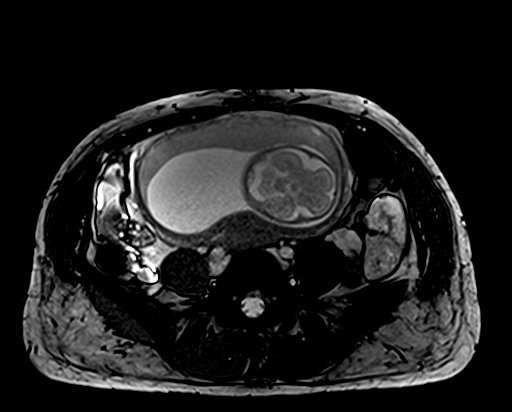
[im 30/45]
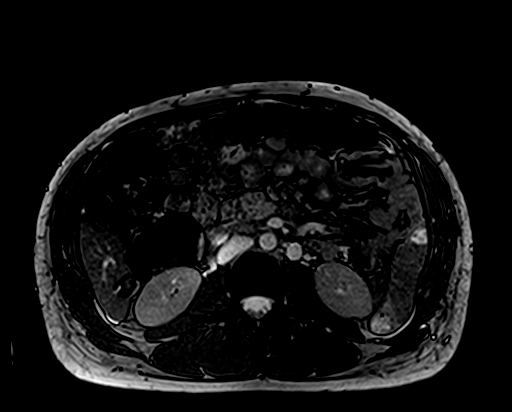
[im 45/45]
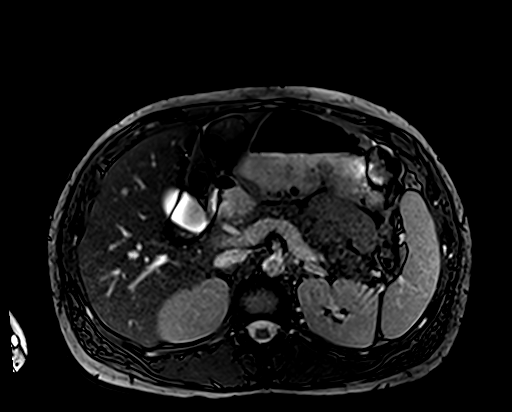

[Series 12: sag tse trig · sagittal · 5.0mm · 0.75mm/px · 3 of 33 slices shown]
[im 1/33]
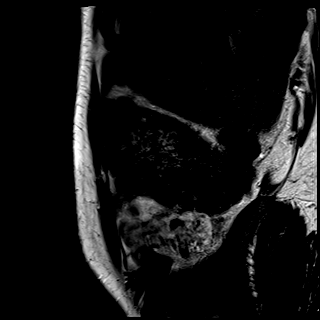
[im 17/33]
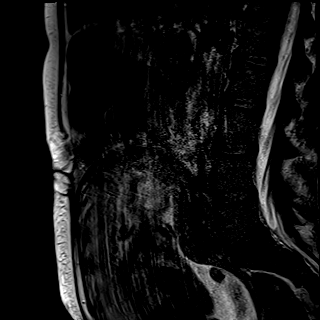
[im 33/33]
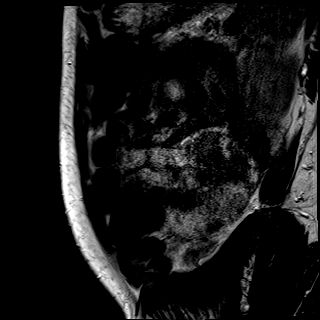

[Series 13: T1 dynamic · axial · 1.4mm · 0.78mm/px · z∈[-229,-100]mm · 6 of 128 slices shown]
[im 1/128]
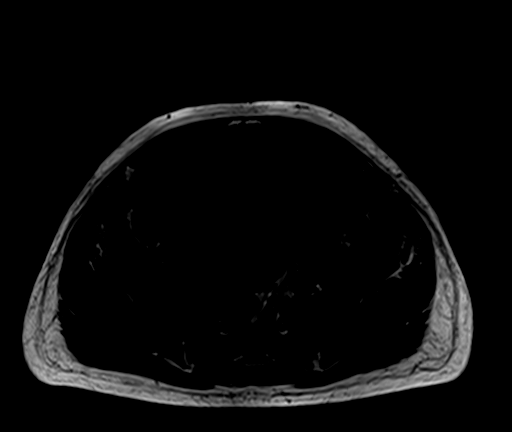
[im 24/128]
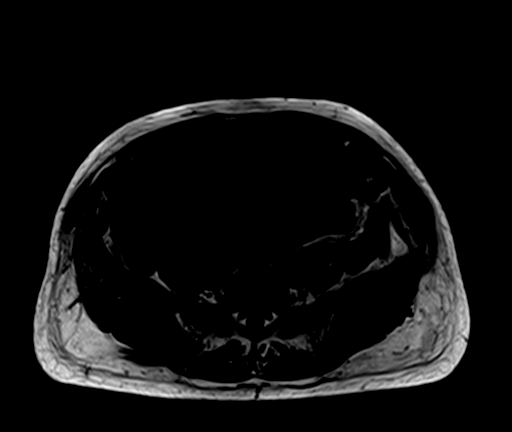
[im 35/128]
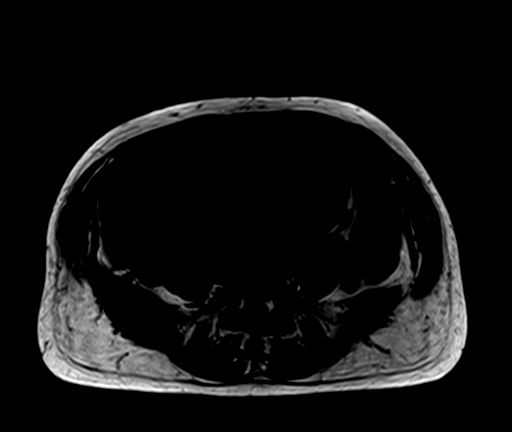
[im 58/128]
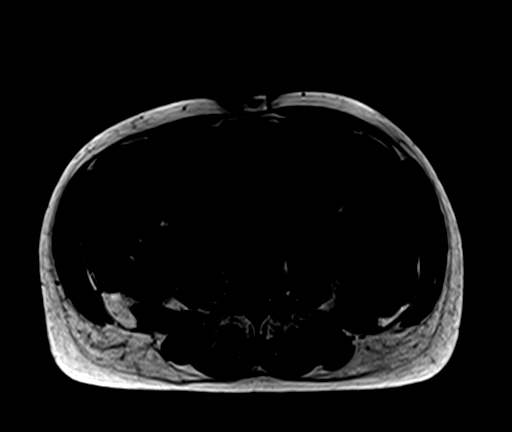
[im 70/128]
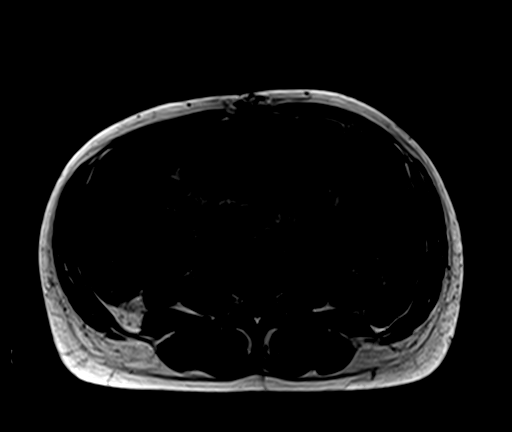
[im 93/128]
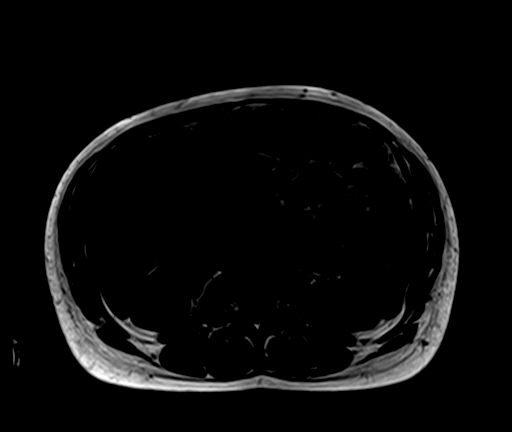

[30 of 48 positions shown; findings below may reference images not displayed]

FINDINGS: Lower chest:  Lung bases are clear.

Hepatobiliary: No biliary duct dilatation. Gallbladder appears
normal without inflammation. Common bile duct normal.

Pancreas: No inflammation

Spleen: Normal spleen.

Adrenals/urinary tract: No hydronephrosis.

Stomach/Bowel: Stomach limited view of the bowel is unremarkable.

Vascular/Lymphatic: Abdominal aortic normal caliber. No
retroperitoneal periportal lymphadenopathy.

Musculoskeletal: No aggressive osseous lesion

Other: There a 2 ventral hernias. One hernia at the umbilicus and 1
just superior. These are best seen on sagittal image series 12. The
more superior hernia sac measures 2.3 cm with a 0.5 cm mouth (image
[DATE]). The smaller umbilical hernia measures 14 mm with a 6 mm
mouth. No clear evidence of bowel entering these small ventral
hernias. There is mild inflammation and fluid within the hernia
sacs.

Gravid uterus no gross complication.

The entire pelvis not imaged. No evidence of appendicitis on partial
imaging of pelvis.
IMPRESSION: 1. Two small ventral hernias which contain a small amount of fluid
and minimal inflammation.

2.  Limited view of the pelvis without evidence of appendicitis.

3.  No hydronephrosis.

4.  Gravid uterus.

## 2022-12-07 ENCOUNTER — Encounter (HOSPITAL_BASED_OUTPATIENT_CLINIC_OR_DEPARTMENT_OTHER): Payer: Self-pay

## 2022-12-07 ENCOUNTER — Emergency Department (HOSPITAL_BASED_OUTPATIENT_CLINIC_OR_DEPARTMENT_OTHER)
Admission: EM | Admit: 2022-12-07 | Discharge: 2022-12-08 | Discharge status: Against Medical Advice | Payer: Medicaid Other | Source: Home / Self Care

## 2022-12-07 ENCOUNTER — Emergency Department (HOSPITAL_COMMUNITY)
Admission: EM | Admit: 2022-12-07 | Discharge: 2022-12-07 | Discharge status: Against Medical Advice | Payer: Medicaid Other

## 2022-12-07 ENCOUNTER — Other Ambulatory Visit: Payer: Self-pay

## 2022-12-07 DIAGNOSIS — R112 Nausea with vomiting, unspecified: Secondary | ICD-10-CM | POA: Insufficient documentation

## 2022-12-07 DIAGNOSIS — R509 Fever, unspecified: Secondary | ICD-10-CM | POA: Insufficient documentation

## 2022-12-07 DIAGNOSIS — H9209 Otalgia, unspecified ear: Secondary | ICD-10-CM | POA: Insufficient documentation

## 2022-12-07 DIAGNOSIS — Z5321 Procedure and treatment not carried out due to patient leaving prior to being seen by health care provider: Secondary | ICD-10-CM | POA: Insufficient documentation

## 2022-12-07 LAB — CBC
HCT: 35.1 % — ABNORMAL LOW (ref 36.0–46.0)
Hemoglobin: 12.2 g/dL (ref 12.0–15.0)
MCH: 31 pg (ref 26.0–34.0)
MCHC: 34.8 g/dL (ref 30.0–36.0)
MCV: 89.3 fL (ref 80.0–100.0)
Platelets: 151 10*3/uL (ref 150–400)
RBC: 3.93 MIL/uL (ref 3.87–5.11)
RDW: 11.8 % (ref 11.5–15.5)
WBC: 4.1 10*3/uL (ref 4.0–10.5)
nRBC: 0 % (ref 0.0–0.2)

## 2022-12-07 LAB — COMPREHENSIVE METABOLIC PANEL
ALT: 19 U/L (ref 0–44)
AST: 28 U/L (ref 15–41)
Albumin: 4.5 g/dL (ref 3.5–5.0)
Alkaline Phosphatase: 31 U/L — ABNORMAL LOW (ref 38–126)
Anion gap: 10 (ref 5–15)
BUN: 13 mg/dL (ref 6–20)
CO2: 27 mmol/L (ref 22–32)
Calcium: 8.7 mg/dL — ABNORMAL LOW (ref 8.9–10.3)
Chloride: 95 mmol/L — ABNORMAL LOW (ref 98–111)
Creatinine, Ser: 0.92 mg/dL (ref 0.44–1.00)
GFR, Estimated: >60 mL/min (ref >60)
Glucose, Bld: 85 mg/dL (ref 70–99)
Potassium: 3.9 mmol/L (ref 3.5–5.1)
Sodium: 132 mmol/L — ABNORMAL LOW (ref 135–145)
Total Bilirubin: 0.5 mg/dL (ref 0.3–1.2)
Total Protein: 7.1 g/dL (ref 6.5–8.1)

## 2022-12-07 LAB — LIPASE, BLOOD: Lipase: <10 U/L — ABNORMAL LOW (ref 11–51)

## 2022-12-07 LAB — HCG, SERUM, QUALITATIVE: Preg, Serum: NEGATIVE

## 2022-12-07 MED ORDER — ONDANSETRON 4 MG PO TBDP
4.0000 mg | ORAL_TABLET | Freq: Once | ORAL | Status: AC
  Administered 2022-12-07: 4 mg via ORAL
  Filled 2022-12-07: qty 1

## 2022-12-07 NOTE — ED Triage Notes (Signed)
Patient here POV from Home.  Feeling Ill for approximately 1 Week. States Symptoms actually improved but worsened over the past two days. Symptoms include Fever, N/V, Otalgia.   Diagnosed with Flu yesterday. No Diarrhea.   Vomiting in Triage. A&Ox4. GCS 15. BIB Wheelchair.

## 2022-12-07 NOTE — ED Notes (Signed)
[facility] called and pt is there

## 2022-12-08 ENCOUNTER — Other Ambulatory Visit: Payer: Self-pay

## 2022-12-08 ENCOUNTER — Emergency Department (HOSPITAL_BASED_OUTPATIENT_CLINIC_OR_DEPARTMENT_OTHER): Payer: Medicaid Other

## 2022-12-08 ENCOUNTER — Encounter (HOSPITAL_BASED_OUTPATIENT_CLINIC_OR_DEPARTMENT_OTHER): Payer: Self-pay | Admitting: Emergency Medicine

## 2022-12-08 ENCOUNTER — Inpatient Hospital Stay (HOSPITAL_COMMUNITY): Payer: Medicaid Other

## 2022-12-08 ENCOUNTER — Encounter (HOSPITAL_COMMUNITY): Payer: Self-pay

## 2022-12-08 ENCOUNTER — Inpatient Hospital Stay (HOSPITAL_BASED_OUTPATIENT_CLINIC_OR_DEPARTMENT_OTHER)
Admission: EM | Admit: 2022-12-08 | Discharge: 2022-12-09 | DRG: 189 | Disposition: A | Discharge status: Home / Self Care | Payer: Medicaid Other | Attending: Family Medicine | Admitting: Family Medicine

## 2022-12-08 DIAGNOSIS — S025XXA Fracture of tooth (traumatic), initial encounter for closed fracture: Secondary | ICD-10-CM | POA: Diagnosis present

## 2022-12-08 DIAGNOSIS — Z1152 Encounter for screening for COVID-19: Secondary | ICD-10-CM

## 2022-12-08 DIAGNOSIS — R55 Syncope and collapse: Secondary | ICD-10-CM | POA: Diagnosis not present

## 2022-12-08 DIAGNOSIS — Z87891 Personal history of nicotine dependence: Secondary | ICD-10-CM | POA: Diagnosis not present

## 2022-12-08 DIAGNOSIS — R7989 Other specified abnormal findings of blood chemistry: Secondary | ICD-10-CM | POA: Diagnosis present

## 2022-12-08 DIAGNOSIS — R001 Bradycardia, unspecified: Secondary | ICD-10-CM | POA: Diagnosis present

## 2022-12-08 DIAGNOSIS — E876 Hypokalemia: Secondary | ICD-10-CM | POA: Diagnosis not present

## 2022-12-08 DIAGNOSIS — J101 Influenza due to other identified influenza virus with other respiratory manifestations: Secondary | ICD-10-CM | POA: Diagnosis present

## 2022-12-08 DIAGNOSIS — R7402 Elevation of levels of lactic acid dehydrogenase (LDH): Secondary | ICD-10-CM | POA: Diagnosis present

## 2022-12-08 DIAGNOSIS — E860 Dehydration: Secondary | ICD-10-CM | POA: Diagnosis present

## 2022-12-08 DIAGNOSIS — R519 Headache, unspecified: Secondary | ICD-10-CM | POA: Diagnosis not present

## 2022-12-08 DIAGNOSIS — Z8711 Personal history of peptic ulcer disease: Secondary | ICD-10-CM | POA: Diagnosis not present

## 2022-12-08 DIAGNOSIS — Z79899 Other long term (current) drug therapy: Secondary | ICD-10-CM | POA: Diagnosis not present

## 2022-12-08 DIAGNOSIS — T424X1A Poisoning by benzodiazepines, accidental (unintentional), initial encounter: Secondary | ICD-10-CM | POA: Diagnosis present

## 2022-12-08 DIAGNOSIS — R0902 Hypoxemia: Principal | ICD-10-CM

## 2022-12-08 DIAGNOSIS — J9601 Acute respiratory failure with hypoxia: Secondary | ICD-10-CM | POA: Diagnosis not present

## 2022-12-08 LAB — URINALYSIS, ROUTINE W REFLEX MICROSCOPIC
Bilirubin Urine: NEGATIVE
Glucose, UA: NEGATIVE mg/dL
Hgb urine dipstick: NEGATIVE
Ketones, ur: >80 mg/dL — AB
Leukocytes,Ua: NEGATIVE
Nitrite: NEGATIVE
Specific Gravity, Urine: >1.046 — ABNORMAL HIGH (ref 1.005–1.030)
pH: 6 (ref 5.0–8.0)

## 2022-12-08 LAB — CBC WITH DIFFERENTIAL/PLATELET
Abs Immature Granulocytes: 0.02 10*3/uL (ref 0.00–0.07)
Basophils Absolute: 0 10*3/uL (ref 0.0–0.1)
Basophils Relative: 0 %
Eosinophils Absolute: 0 10*3/uL (ref 0.0–0.5)
Eosinophils Relative: 0 %
HCT: 35.6 % — ABNORMAL LOW (ref 36.0–46.0)
Hemoglobin: 12.3 g/dL (ref 12.0–15.0)
Immature Granulocytes: 0 %
Lymphocytes Relative: 21 %
Lymphs Abs: 1.8 10*3/uL (ref 0.7–4.0)
MCH: 31.1 pg (ref 26.0–34.0)
MCHC: 34.6 g/dL (ref 30.0–36.0)
MCV: 90.1 fL (ref 80.0–100.0)
Monocytes Absolute: 0.9 10*3/uL (ref 0.1–1.0)
Monocytes Relative: 11 %
Neutro Abs: 5.9 10*3/uL (ref 1.7–7.7)
Neutrophils Relative %: 68 %
Platelets: 171 10*3/uL (ref 150–400)
RBC: 3.95 MIL/uL (ref 3.87–5.11)
RDW: 11.9 % (ref 11.5–15.5)
WBC: 8.6 10*3/uL (ref 4.0–10.5)
nRBC: 0 % (ref 0.0–0.2)

## 2022-12-08 LAB — BLOOD GAS, VENOUS
Acid-Base Excess: 3.7 mmol/L — ABNORMAL HIGH (ref 0.0–2.0)
Bicarbonate: 30.5 mmol/L — ABNORMAL HIGH (ref 20.0–28.0)
Drawn by: 66624
O2 Saturation: 66.5 %
Patient temperature: 36.8
pCO2, Ven: 54 mmHg (ref 44–60)
pH, Ven: 7.36 (ref 7.25–7.43)
pO2, Ven: 38 mmHg (ref 32–45)

## 2022-12-08 LAB — RAPID URINE DRUG SCREEN, HOSP PERFORMED
Amphetamines: NOT DETECTED
Barbiturates: NOT DETECTED
Benzodiazepines: POSITIVE — AB
Cocaine: NOT DETECTED
Opiates: NOT DETECTED
Tetrahydrocannabinol: POSITIVE — AB

## 2022-12-08 LAB — COMPREHENSIVE METABOLIC PANEL
ALT: 46 U/L — ABNORMAL HIGH (ref 0–44)
AST: 74 U/L — ABNORMAL HIGH (ref 15–41)
Albumin: 4.5 g/dL (ref 3.5–5.0)
Alkaline Phosphatase: 40 U/L (ref 38–126)
Anion gap: 13 (ref 5–15)
BUN: 14 mg/dL (ref 6–20)
CO2: 28 mmol/L (ref 22–32)
Calcium: 8.9 mg/dL (ref 8.9–10.3)
Chloride: 95 mmol/L — ABNORMAL LOW (ref 98–111)
Creatinine, Ser: 0.93 mg/dL (ref 0.44–1.00)
GFR, Estimated: >60 mL/min (ref >60)
Glucose, Bld: 126 mg/dL — ABNORMAL HIGH (ref 70–99)
Potassium: 3.8 mmol/L (ref 3.5–5.1)
Sodium: 136 mmol/L (ref 135–145)
Total Bilirubin: 0.5 mg/dL (ref 0.3–1.2)
Total Protein: 7.1 g/dL (ref 6.5–8.1)

## 2022-12-08 LAB — LACTIC ACID, PLASMA
Lactic Acid, Venous: 2.7 mmol/L (ref 0.5–1.9)
Lactic Acid, Venous: 0.7 mmol/L (ref 0.5–1.9)

## 2022-12-08 LAB — BRAIN NATRIURETIC PEPTIDE: B Natriuretic Peptide: 94.9 pg/mL (ref 0.0–100.0)

## 2022-12-08 LAB — TROPONIN I (HIGH SENSITIVITY)
Troponin I (High Sensitivity): 225 ng/L (ref <18)
Troponin I (High Sensitivity): 242 ng/L (ref <18)
Troponin I (High Sensitivity): 118 ng/L (ref <18)

## 2022-12-08 LAB — RESP PANEL BY RT-PCR (RSV, FLU A&B, COVID)  RVPGX2
Influenza A by PCR: POSITIVE — AB
Influenza B by PCR: NEGATIVE
Resp Syncytial Virus by PCR: NEGATIVE
SARS Coronavirus 2 by RT PCR: NEGATIVE

## 2022-12-08 LAB — CBG MONITORING, ED: Glucose-Capillary: 108 mg/dL — ABNORMAL HIGH (ref 70–99)

## 2022-12-08 LAB — AMMONIA: Ammonia: 46 umol/L — ABNORMAL HIGH (ref 9–35)

## 2022-12-08 LAB — LIPASE, BLOOD: Lipase: 14 U/L (ref 11–51)

## 2022-12-08 MED ORDER — SODIUM CHLORIDE 0.9 % IV SOLN: INTRAVENOUS | Status: AC

## 2022-12-08 MED ORDER — ONDANSETRON HCL 4 MG/2ML IJ SOLN
4.0000 mg | Freq: Once | INTRAMUSCULAR | Status: AC
  Administered 2022-12-08: 4 mg via INTRAVENOUS
  Filled 2022-12-08: qty 2

## 2022-12-08 MED ORDER — SUCCINYLCHOLINE CHLORIDE 200 MG/10ML IV SOSY
PREFILLED_SYRINGE | INTRAVENOUS | Status: AC
  Filled 2022-12-08: qty 10

## 2022-12-08 MED ORDER — ACETAMINOPHEN 650 MG RE SUPP: 650.0000 mg | Freq: Four times a day (QID) | RECTAL | Status: DC | PRN

## 2022-12-08 MED ORDER — ROCURONIUM BROMIDE 10 MG/ML (PF) SYRINGE
PREFILLED_SYRINGE | INTRAVENOUS | Status: AC
  Filled 2022-12-08: qty 10

## 2022-12-08 MED ORDER — NALOXONE HCL 2 MG/2ML IJ SOSY
PREFILLED_SYRINGE | INTRAMUSCULAR | Status: AC
  Administered 2022-12-08: 2 mg
  Filled 2022-12-08: qty 2

## 2022-12-08 MED ORDER — ACETAMINOPHEN 325 MG PO TABS
650.0000 mg | ORAL_TABLET | Freq: Four times a day (QID) | ORAL | Status: DC | PRN
  Administered 2022-12-08 – 2022-12-09 (×2): 650 mg via ORAL
  Filled 2022-12-08 (×2): qty 2

## 2022-12-08 MED ORDER — IOHEXOL 350 MG/ML SOLN
100.0000 mL | Freq: Once | INTRAVENOUS | Status: AC | PRN
  Administered 2022-12-08: 75 mL via INTRAVENOUS

## 2022-12-08 MED ORDER — SODIUM CHLORIDE 0.9 % IV BOLUS
1000.0000 mL | Freq: Once | INTRAVENOUS | Status: AC
  Administered 2022-12-08: 1000 mL via INTRAVENOUS

## 2022-12-08 MED ORDER — ETOMIDATE 2 MG/ML IV SOLN
INTRAVENOUS | Status: AC
  Filled 2022-12-08: qty 20

## 2022-12-08 NOTE — ED Notes (Signed)
RN called to waiting room, pt was slumped over, hands and face were blue, unresponsive. Pt was taken to trauma room. Initially her oxygen sats were in the 30"s. Pt placed on 100% nonrebreather. Narcan administered. Pt awake and moving after that, answering questions. Pt now alert,talking, vss, on 4 liters oxygen nasal cannula. Mother at bedside.

## 2022-12-08 NOTE — ED Triage Notes (Signed)
4 days vomiting, pt states she is hallucinating, dx flu 2 days ago,started tamiflu.

## 2022-12-08 NOTE — Assessment & Plan Note (Addendum)
this patient has acute respiratory failure with Hypoxia  as documented by the presence of following: O2 saturatio< 90% on RA  Likely due to: Drug overdose versus influenza A Provide O2 therapy and titrate as needed  Continuous pulse ox   check Pulse ox with ambulation prior to discharge   may need  TC consult for home O2 set up    flutter valve ordered  Possibly in the setting of accidental benzo overdose versus narcotic that does not show up on U tox.  Patient denies any history of narcotic use.  But Narcan seem to have improved her symptoms.  Continue to monitor carefully. Oxygen as needed.  CTA negative for PE.  Troponin downtrending.  Echogram in the morning

## 2022-12-08 NOTE — ED Notes (Signed)
Pt placed on bedpan

## 2022-12-08 NOTE — ED Notes (Signed)
BP soft. MAP 62. Pt remains Aox4. MD notified and PA notified. 1L NS hung--> MAP improves to 78 after 569mL.

## 2022-12-08 NOTE — Assessment & Plan Note (Signed)
In the setting of hypoxia and syncope.  Most likely demand We will continue to trend Obtain echogram

## 2022-12-08 NOTE — H&P (Addendum)
Debbie Griffin WIO:[id number] DOB: [birth year] DOA: 12/08/2022     PCP: Patient, No Pcp Per       Patient arrived to ER on 12/08/22 at 0954 Referred by Attending Griffin, Debbie Allegra, *   Patient coming from:    home Lives With family   Chief Complaint: syncope   HPI: Debbie Griffin is a 42 y.o. female with medical history significant of   ADHD    Presented with 1 wk of influenza symptoms Patient with 6-day history of chills fatigue congestion viral illness was tested positive for hip influenza A.  Started on Tamiflu then developed nausea vomiting diarrhea and hallucinations became very lightheaded try to seek care at the ER but the wait list was too long Could not sleep took Xanax at home then presented back to [facility] this morning and while waiting in the ER had syncopal episode Patient was noted to be unresponsive and cyanotic was brought back to the evaluation room with waxing and waning mental status noted to have evidence of hypoxia with oxygen saturation down in 20s got bag-valve-mask with pupils at 3 mm and sluggish she was given Narcan and started to respond with improved oxygen saturation not requiring intubation any longer  U tox positive for benzos and tetrahydrocannabinol also noted to have elevated troponin up to 242 now downtrending CTA negative for PE  Dr. Stanford Breed was consulted recommended trending troponin and echo   Her BP runs in 90's She takes trazodone and adderol Reports pounding headache Body aches  Reports CP prior but now much better  Does not smoke or drink She took a xanax last night to try to sleep last night  Reports fever today No family hx CAD  She is very active no CP with exrtion Reports she has a broken tooth with pain around it     Initial COVID TEST  NEGATIVE   Lab Results  Component Value Date   [place] 12/08/2022   [place] NEGATIVE 08/31/2020   [place] NEGATIVE 05/22/2020   [place] Not Detected  07/10/2019     Regarding pertinent Chronic problems:    While in ER:    CT HEAD   NON acute  CXR - NON acute    CTA chest - nonacute, no PE,no evidence of infiltrate  Following Medications were ordered in ER: Medications  0.9 %  sodium chloride infusion (has no administration in time range)  acetaminophen (TYLENOL) tablet 650 mg (has no administration in time range)    Or  acetaminophen (TYLENOL) suppository 650 mg (has no administration in time range)  naloxone [facility]) 2 MG/2ML injection (2 mg  Given 12/08/22 1030)  sodium chloride 0.9 % bolus 1,000 mL (0 mLs Intravenous Stopped 12/08/22 1345)  iohexol (OMNIPAQUE) 350 MG/ML injection 100 mL (75 mLs Intravenous Contrast Given 12/08/22 1113)  ondansetron (ZOFRAN) injection 4 mg (4 mg Intravenous Given 12/08/22 1259)    _______________________________________________________ ER Provider Called:   Cardiology  Dr.  Stanford Breed  They Recommend admit to medicine   Will see in AM      ED Triage Vitals  Enc Vitals Group     BP 12/08/22 1003 106/70     Pulse Rate 12/08/22 1003 87     Resp 12/08/22 1003 (!) 24     Temp 12/08/22 1003 99.2 F (37.3 C)     Temp Source 12/08/22 1235 Oral     SpO2 12/08/22 1003 100 %     Weight 12/08/22 2103 162 lb 6.4 oz (  73.7 kg)     Height 12/08/22 2103 5\' 8"  (1.727 m)     Head Circumference --      Peak Flow --      Pain Score 12/08/22 1002 10     Pain Loc --      Pain Edu? --      Excl. in GC? --   TMAX(24)@     _________________________________________ Significant initial  Findings: Abnormal Labs Reviewed  RESP PANEL BY RT-PCR (RSV, FLU A&B, COVID)  RVPGX2 - Abnormal; Notable for the following components:      Result Value   Influenza A by PCR POSITIVE (*)    All other components within normal limits  URINALYSIS, ROUTINE W REFLEX MICROSCOPIC - Abnormal; Notable for the following components:   Specific Gravity, Urine >1.046 (*)    Ketones, ur >80 (*)    Protein, ur TRACE (*)    All other  components within normal limits  RAPID URINE DRUG SCREEN, HOSP PERFORMED - Abnormal; Notable for the following components:   Benzodiazepines POSITIVE (*)    Tetrahydrocannabinol POSITIVE (*)    All other components within normal limits  CBC WITH DIFFERENTIAL/PLATELET - Abnormal; Notable for the following components:   HCT 35.6 (*)    All other components within normal limits  COMPREHENSIVE METABOLIC PANEL - Abnormal; Notable for the following components:   Chloride 95 (*)    Glucose, Bld 126 (*)    AST 74 (*)    ALT 46 (*)    All other components within normal limits  LACTIC ACID, PLASMA - Abnormal; Notable for the following components:   Lactic Acid, Venous 2.7 (*)    All other components within normal limits  AMMONIA - Abnormal; Notable for the following components:   Ammonia 46 (*)    All other components within normal limits  BLOOD GAS, VENOUS - Abnormal; Notable for the following components:   Bicarbonate 30.5 (*)    Acid-Base Excess 3.7 (*)    All other components within normal limits  CBG MONITORING, ED - Abnormal; Notable for the following components:   Glucose-Capillary 108 (*)    All other components within normal limits  TROPONIN I (HIGH SENSITIVITY) - Abnormal; Notable for the following components:   Troponin I (High Sensitivity) 225 (*)    All other components within normal limits  TROPONIN I (HIGH SENSITIVITY) - Abnormal; Notable for the following components:   Troponin I (High Sensitivity) 242 (*)    All other components within normal limits  TROPONIN I (HIGH SENSITIVITY) - Abnormal; Notable for the following components:   Troponin I (High Sensitivity) 118 (*)    All other components within normal limits  TROPONIN I (HIGH SENSITIVITY) - Abnormal; Notable for the following components:   Troponin I (High Sensitivity) 55 (*)    All other components within normal limits     _________________________ Troponin 250 - 118 -55 ECG: Ordered Personally reviewed and  interpreted by me showing: HR : 46 Rhythm: Sinus bradycardia T wave abnormality, consider inferior ischemia T wave abnormality, consider anterior ischemia Abnormal ECG QTC 409     The recent clinical data is shown below. Vitals:   12/08/22 1932 12/08/22 1945 12/08/22 2000 12/08/22 2103  BP: (!) 92/59 (!) 87/59 (!) 97/59 (!) 97/52  Pulse: (!) 44 (!) 52 (!) 54 60  Resp: 12 13 16 16   Temp:    98.3 F (36.8 C)  TempSrc:    Oral  SpO2: 98% 99% 100% 100%  Weight:    73.7 kg  Height:    5\' 8"  (1.727 m)     WBC     Component Value Date/Time   WBC 8.6 12/08/2022 1036   LYMPHSABS 1.8 12/08/2022 1036   MONOABS 0.9 12/08/2022 1036   EOSABS 0.0 12/08/2022 1036   BASOSABS 0.0 12/08/2022 1036        Lactic Acid, Venous    Component Value Date/Time   LATICACIDVEN 0.7 12/08/2022 1308      UA   no evidence of UTI     Urine analysis:    Component Value Date/Time   COLORURINE YELLOW 12/08/2022 1453   APPEARANCEUR CLEAR 12/08/2022 1453   LABSPEC >1.046 (H) 12/08/2022 1453   PHURINE 6.0 12/08/2022 1453   GLUCOSEU NEGATIVE 12/08/2022 1453   HGBUR NEGATIVE 12/08/2022 1453   BILIRUBINUR NEGATIVE 12/08/2022 1453   KETONESUR >80 (A) 12/08/2022 1453   PROTEINUR TRACE (A) 12/08/2022 1453   UROBILINOGEN 1.0 09/11/2014 0759   NITRITE NEGATIVE 12/08/2022 1453   LEUKOCYTESUR NEGATIVE 12/08/2022 1453    Results for orders placed or performed during the hospital encounter of 12/08/22  Blood culture (routine x 2)     Status: None (Preliminary result)   Collection Time: 12/08/22 10:36 AM   Specimen: BLOOD  Result Value Ref Range Status   Specimen Description   Final    BLOOD LEFT ANTECUBITAL Performed at [facility], [address] [place], [place] [address]    Special Requests   Final    BOTTLES DRAWN AEROBIC AND ANAEROBIC Blood Culture adequate volume Performed at [facility], [address] [address], [place], [place] [address]    Culture PENDING  Incomplete    Report Status PENDING  Incomplete  Resp panel by RT-PCR (RSV, Flu A&B, Covid) Anterior Nasal Swab     Status: Abnormal   Collection Time: 12/08/22 10:48 AM   Specimen: Anterior Nasal Swab  Result Value Ref Range Status   SARS Coronavirus 2 by RT PCR NEGATIVE NEGATIVE Final         Influenza A by PCR POSITIVE (A) NEGATIVE Final   Influenza B by PCR NEGATIVE NEGATIVE Final         Resp Syncytial Virus by PCR NEGATIVE NEGATIVE Final          _______________________________________________ Hospitalist was called for admission for   Hypoxia    Influenza A    The following Work up has been ordered so far:  Orders Placed This Encounter  Procedures   Urine Culture   Resp panel by RT-PCR (RSV, Flu A&B, Covid) Anterior Nasal Swab   Blood culture (routine x 2)   DG Chest Portable 1 View   CT Angio Chest PE W and/or Wo Contrast   Urinalysis, Routine w reflex microscopic   Rapid urine drug screen (hospital performed)   CBC with Differential   Comprehensive metabolic panel   Lactic acid, plasma   Lipase, blood   Ammonia   Brain natriuretic peptide   CK   Magnesium   Phosphorus   TSH   Blood gas, venous   TSH   HIV Antibody (routine testing w rflx)   Comprehensive metabolic panel   CBC   Magnesium   Phosphorus   Care order/instruction: [facility] to [facility], nursing staff will need to notify PATIENT PLACEMENT that the patient has arrived. The flow manager will contact [facility] to assign patient to the appropriate physician. Nursing staff will need to notify the ...   Cardiac Monitoring -  Continuous Indefinite   Patient has an active order for admit to inpatient/place in observation   Vital signs   Notify physician (specify)   Progressive Mobility Protocol: No Restrictions   If patient diabetic or glucose greater than 140 notify physician for Sliding Scale Insulin Orders   Oral care per nursing protocol   Initiate Oral Care Protocol   Initiate Carrier Fluid Protocol   Assess    Check Pulse Oximetry while ambulating   Place order for blood cultures x 2 (from different sites) for Temp > 101F   RN may order General Admission PRN Orders utilizing "General Admission PRN medications" (through manage orders) for the following patient needs: allergy symptoms (Claritin), cold sores (Carmex), cough (Robitussin DM), eye irritation (Liquifilm Tears), hemorrhoids (Tucks), indigestion (Maalox), minor skin irritation (Hydrocortisone Cream), muscle pain Romeo Apple Gay), nose irritation (saline nasal spray) and sore throat (Chloraseptic spray).   SCDs   Cardiac Monitoring - Continuous Indefinite   Full code   Inpatient consult to Cardiology   Consult to hospitalist   May transport without cardiac monitor   Pulse oximetry check with vital signs   Oxygen therapy Mode or (Route): Nasal cannula; Liters Per Minute: 2; Keep 02 saturation: greater than 92 %   Incentive spirometry   Pulse oximetry, continuous   CBG monitoring, ED   EKG 12-Lead   ED EKG   EKG 12-Lead   EKG 12-Lead   EKG 12-Lead   EKG 12-Lead   ECHOCARDIOGRAM COMPLETE   Admit to Inpatient (patient's expected length of stay will be greater than 2 midnights or inpatient only procedure)   Admit to Inpatient (patient's expected length of stay will be greater than 2 midnights or inpatient only procedure)     OTHER Significant initial  Findings:  labs showing:  Recent Labs  Lab 12/07/22 1952 12/08/22 1036 12/08/22 2303  NA 132* 136  --   K 3.9 3.8  --   CO2 27 28  --   GLUCOSE 85 126*  --   BUN 13 14  --   CREATININE 0.92 0.93  --   CALCIUM 8.7* 8.9  --   MG  --   --  2.1  PHOS  --   --  3.0    Cr   stable,    Lab Results  Component Value Date   CREATININE 0.93 12/08/2022   CREATININE 0.92 12/07/2022   CREATININE 0.68 05/22/2020    Recent Labs  Lab 12/07/22 1952 12/08/22 1036  AST 28 74*  ALT 19 46*  ALKPHOS 31* 40  BILITOT 0.5 0.5  PROT 7.1 7.1  ALBUMIN 4.5 4.5   Lab Results  Component Value Date    CALCIUM 8.9 12/08/2022    Plt: Lab Results  Component Value Date   PLT 171 12/08/2022    COVID-19 Labs  No results for input(s): "DDIMER", "FERRITIN", "LDH", "CRP" in the last 72 hours.  Lab Results  Component Value Date   SARSCOV2NAA NEGATIVE 12/08/2022   SARSCOV2NAA NEGATIVE 08/31/2020   SARSCOV2NAA NEGATIVE 05/22/2020   SARSCOV2NAA Not Detected 07/10/2019    Venous  Blood Gas result:  pH   7.36 O2 Saturation 66.5 %   pCO2, Ven 54 mmHg Patient temperature 36.8  pO2, Ven 38 mmHg        Recent Labs  Lab 12/07/22 1952 12/08/22 1036  WBC 4.1 8.6  NEUTROABS  --  5.9  HGB 12.2 12.3  HCT 35.1* 35.6*  MCV 89.3 90.1  PLT 151 171  HG/HCT   stable,      Component Value Date/Time   HGB 12.3 12/08/2022 1036   HCT 35.6 (L) 12/08/2022 1036   MCV 90.1 12/08/2022 1036     Recent Labs  Lab 12/07/22 1952 12/08/22 1036  LIPASE <10* 14   Recent Labs  Lab 12/08/22 1308  AMMONIA 46*    Cardiac Panel (last 3 results) Recent Labs    12/08/22 2303  CKTOTAL 196    .car BNP (last 3 results) Recent Labs    12/08/22 1036  BNP 94.9      DM  labs:  HbA1C: No results for input(s): "HGBA1C" in the last 8760 hours.     CBG (last 3)  Recent Labs    12/08/22 1030  GLUCAP 108*    Cultures:    Component Value Date/Time   SDES  12/08/2022 1036    BLOOD LEFT ANTECUBITAL Performed at [facility], [address] [place], [place] [address]    [facility]  12/08/2022 1036    BOTTLES DRAWN AEROBIC AND ANAEROBIC Blood Culture adequate volume Performed at [facility], [address], [place], [place] [address]    CULT PENDING 12/08/2022 1036   REPTSTATUS PENDING 12/08/2022 1036     Radiological Exams on Admission: CT Angio Chest PE W and/or Wo Contrast  Result Date: 12/08/2022 CLINICAL DATA:  Desaturation, tachycardia, tachypnea, recent viral illness, on birth control medication, question pulmonary embolism. EXAM: CT ANGIOGRAPHY  CHEST WITH CONTRAST TECHNIQUE: Multidetector CT imaging of the chest was performed using the standard protocol during bolus administration of intravenous contrast. Multiplanar CT image reconstructions and MIPs were obtained to evaluate the vascular anatomy. RADIATION DOSE REDUCTION: This exam was performed according to the departmental dose-optimization program which includes automated exposure control, adjustment of the mA and/or kV according to patient size and/or use of iterative reconstruction technique. CONTRAST:  75mL OMNIPAQUE IOHEXOL 350 MG/ML SOLN IV COMPARISON:  None Available. FINDINGS: Cardiovascular: Heart unremarkable. No pericardial effusion. Aorta normal caliber without aneurysm or dissection. Pulmonary arteries adequately opacified and patent. No evidence of pulmonary embolism. Mediastinum/Nodes: Esophagus unremarkable. Amorphous tissue in anterior mediastinum question residual thymic tissue. No discrete adenopathy or mass. Lungs/Pleura: Lungs clear. No pulmonary infiltrate, pleural effusion, or pneumothorax. Upper Abdomen: Visualized upper abdomen unremarkable. Musculoskeletal: No acute osseous findings. Review of the MIP images confirms the above findings. IMPRESSION: No evidence of pulmonary embolism. No acute intrathoracic abnormalities. Electronically Signed   By: Ulyses SouthwardMark  Boles M.D.   On: 12/08/2022 11:32   DG Chest Portable 1 View  Result Date: 12/08/2022 CLINICAL DATA:  Hypoxia, unresponsive. EXAM: PORTABLE CHEST 1 VIEW COMPARISON:  12/05/2016 FINDINGS: Apical lordotic projection. Left lateral costophrenic angle omitted. The lungs appear clear. Cardiac and mediastinal margins appear normal. No significant bony abnormality. IMPRESSION: 1. No active cardiopulmonary disease is radiographically apparent. 2. Apical lordotic projection. Electronically Signed   By: Gaylyn RongWalter  Liebkemann M.D.   On: 12/08/2022 11:10    _______________________________________________________________________________________________________ Latest  Blood pressure (!) 97/52, pulse 60, temperature 98.3 F (36.8 C), temperature source Oral, resp. rate 16, height 5\' 8"  (1.727 m), weight 73.7 kg, SpO2 100 %, unknown if currently breastfeeding.   Vitals  labs and radiology finding personally reviewed  Review of Systems:    Pertinent positives include:   Fevers, chills, fatigue nausea,syncope Constitutional:  No weight loss, night sweats,, weight loss  HEENT:  No headaches, Difficulty swallowing,Tooth/dental problems,Sore throat,  No sneezing, itching, ear ache, nasal congestion, post nasal drip,  Cardio-vascular:  No  chest pain, Orthopnea, PND, anasarca, dizziness, palpitations.no Bilateral lower extremity swelling  GI:  No heartburn, indigestion, abdominal pain,  vomiting, diarrhea, change in bowel habits, loss of appetite, melena, blood in stool, hematemesis Resp:  no shortness of breath at rest. No dyspnea on exertion, No excess mucus, no productive cough, No non-productive cough, No coughing up of blood.No change in color of mucus.No wheezing. Skin:  no rash or lesions. No jaundice GU:  no dysuria, change in color of urine, no urgency or frequency. No straining to urinate.  No flank pain.  Musculoskeletal:  No joint pain or no joint swelling. No decreased range of motion. No back pain.  Psych:  No change in mood or affect. No depression or anxiety. No memory loss.  Neuro: no localizing neurological complaints, no tingling, no weakness, no double vision, no gait abnormality, no slurred speech, no confusion  All systems reviewed and apart from HOPI all are negative _______________________________________________________________________________________________ Past Medical History:   Past Medical History:  Diagnosis Date   Adjustment disorder with mixed disturbance of emotions and conduct    Anxiety    Dislocation  of the knee cap    left 2018   Hernia, umbilical    Miscarriage    Multiple gastric ulcers    Opiate abuse, continuous (HCC)    Ovarian cyst       Past Surgical History:  Procedure Laterality Date   ANTERIOR CRUCIATE LIGAMENT REPAIR Left 01/12/2018   Procedure: LEFT KNEE ANTERIOR CRUCIATE LIGAMENT (ACL) RECONSTRUCTION, PARTIAL MEDIAL MENISCECTOMY;  Surgeon: Cammy Copa, MD;  Location: [facility] OR;  Service: Orthopedics;  Laterality: Left;   ESOPHAGOGASTRODUODENOSCOPY     WISDOM TOOTH EXTRACTION      Social History:  Ambulatory   independently       reports that she has quit smoking. Her smoking use included cigarettes. She has never used smokeless tobacco. She reports that she does not currently use alcohol. She reports current drug use. Drug: Marijuana.   Family History:  Family History  Problem Relation Age of Onset   Thyroid disease Father    Other Neg Hx    ______________________________________________________________________________________________ Allergies: No Known Allergies   Prior to Admission medications   Medication Sig Start Date End Date Taking? Authorizing Provider  amoxicillin (AMOXIL) 500 MG tablet Take 500 mg by mouth 3 (three) times daily. 12/06/22  Yes [provider]  amphetamine-dextroamphetamine (ADDERALL XR) 20 MG 24 hr capsule Take 1 capsule by mouth daily.   Yes [provider]  oseltamivir (TAMIFLU) 75 MG capsule Take 75 mg by mouth 2 (two) times daily. 12/06/22  Yes [provider]  trazodone (DESYREL) 300 MG tablet Take 150 mg by mouth at bedtime.   Yes [provider]  ZAFEMY 150-35 MCG/24HR transdermal patch Place 1 patch onto the skin once a week. 11/21/22  Yes [provider]  Prenatal Vit-Fe Fumarate-FA (PRENATAL VITAMIN) 27-0.8 MG TABS Take 1 tablet by mouth daily. Patient not taking: Reported on 12/08/2022 09/03/20   Philip Aspen, DO     ___________________________________________________________________________________________________ Physical Exam:    12/08/2022    9:03 PM 12/08/2022    8:00 PM 12/08/2022    7:45 PM  Vitals with BMI  Height 5\' 8"     Weight 162 lbs 6 oz    BMI 24.7    Systolic 97 97 87  Diastolic 52 59 59  Pulse 60 54 52    1. General:  in No  Acute distress    acutely ill -  appearing 2. Psychological: Alert and   Oriented 3. Head/ENT:    Dry Mucous Membranes                          Head Non traumatic, neck supple                      Poor Dentition 4. SKIN: decreased Skin turgor,  Skin clean Dry and intact no rash 5. Heart: Regular rate and rhythm no  Murmur, no Rub or gallop 6. Lungs: , no wheezes or crackles   7. Abdomen: Soft,  non-tender, Non distended   obese  bowel sounds present 8. Lower extremities: no clubbing, cyanosis, no  edema 9. Neurologically Grossly intact, moving all 4 extremities equally   10. MSK: Normal range of motion    Chart has been reviewed  ______________________________________________________________________________________________  Assessment/Plan  42 y.o. female with medical history significant of   ADHD    Admitted for   Hypoxia  Influenza A, syncope and elevated troponin    Present on Admission:  Acute hypoxic respiratory failure (HCC)  Influenza A  Headache  Elevated troponin  Syncope, vasovagal    Acute hypoxic respiratory failure (HCC)  this patient has acute respiratory failure with Hypoxia  as documented by the presence of following: O2 saturatio< 90% on RA  Likely due to: Drug overdose versus influenza A Provide O2 therapy and titrate as needed  Continuous pulse ox   check Pulse ox with ambulation prior to discharge   may need  TC consult for home O2 set up    flutter valve ordered  Possibly in the setting of accidental benzo overdose versus narcotic that does not show up on U tox.  Patient denies any history of narcotic use.  But  Narcan seem to have improved her symptoms.  Continue to monitor carefully. Oxygen as needed.  CTA negative for PE.  Troponin downtrending.  Echogram in the morning  Influenza A Patient is now 6 days out.  She have had complications with Tamiflu including nausea vomiting diarrhea hallucinations we will hold off on further Tamiflu for right now.  Continue to monitor rehydrate and supportive measures  Headache Patient describes severe headache Obtain CT head given syncope and a fall. Continue to monitor otherwise denies any neurological complaints.  She does report that she has a bad tooth will see if may need dedicated imaging of the abdomen future  Elevated troponin In the setting of hypoxia and syncope.  Most likely demand We will continue to trend Obtain echogram  Syncope, vasovagal In the setting of dehydration, nausea  and vomiting  Using non prescribed benzodiazepine Pt responded well to narcan but Utox did not show narcotics  Denies taking any other meds  Will avoid narcotics  Follow troponin  Echo in AM Monitor on tele  Check orhtostatics    Other plan as per orders.  DVT prophylaxis:    SCDs Start: 12/08/22 2227    Code Status:    Code Status: Full Code FULL CODE  e as per patient   I had personally discussed CODE STATUS with patient    Family Communication:   Family not at  Bedside    Disposition Plan:   To home once workup is complete and patient is stable   Following barriers for discharge:  Electrolytes corrected                             Will need consultants to evaluate patient prior to discharge     Consults called: emailed cardiology   Admission status:  ED Disposition     ED Disposition  [place]: [place] [[id number]]  Level of Care: Progressive [102]  Admit to Progressive based on following criteria: MULTISYSTEM THREATS such as stable sepsis, metabolic/electrolyte  imbalance with or without encephalopathy that is responding to early treatment.  May admit patient to [facility] or [facility] if equivalent level of care is available:: No  Interfacility transfer: Yes  Covid Evaluation: Confirmed COVID Negative  Diagnosis: Acute hypoxic respiratory failure [facility]) [[id number]]  Admitting Physician: Dory Horn [[id number]]  Attending Physician: Emmaleigh Paris [[id number]]  Certification:: I certify this patient will need inpatient services for at least 2 midnights            inpatient     I Expect 2 midnight stay secondary to severity of patient's current illness need for inpatient interventions justified by the following:  hemodynamic instability despite optimal treatment (tachycardia  hypoxia,  )  Severe lab/radiological/exam abnormalities including:    Influenza A , troponin elevation     That are currently affecting medical management.   I expect  patient to be hospitalized for 2 midnights requiring inpatient medical care.  Patient is at high risk for adverse outcome (such as loss of life or disability) if not treated.  Indication for inpatient stay as follows:    New or worsening hypoxia   Need for   IV fluids,    Level of care          progressive tele indefinitely please discontinue once patient no longer qualifies COVID-19 Labs   Lab Results  Component Value Date   [place] 12/08/2022     Precautions: admitted as   Covid Negative   Lochlann Mastrangelo 12/09/2022, 2:19 AM    [facility]     after 2 AM please page floor coverage PA If 7AM-7PM, please contact the day team taking care of the patient using [email or web address]   Patient was evaluated in the context of the global COVID-19 pandemic, which necessitated consideration that the patient might be at risk for infection with the SARS-CoV-2 virus that causes COVID-19. Institutional protocols and algorithms that pertain to the evaluation of patients at risk for  COVID-19 are in a state of rapid change based on information released by regulatory bodies including the CDC and federal and state organizations. These policies and algorithms were followed during the patient's care.

## 2022-12-08 NOTE — ED Notes (Addendum)
Responded to call from the lobby, pt unresponsive . Pt wheeled to treatment room , responded to verbal and sternal rub very briefly , a few seconds later pt  was  unresponsive and shallow breathing , cyanotic lips and hands . MD at bedside ,  Naloxone in route and pushed at 1030. Pt responded immediately to narcan , verbal to MD questions .    A few minutes Prior to the event this RN assisted pt out the  bathroom where she spend around 20 min , she was alert and oriented x 4 when wheeled back to the lobby .

## 2022-12-08 NOTE — ED Notes (Signed)
Unable to obtain 2nd blood culture 

## 2022-12-08 NOTE — ED Provider Notes (Signed)
[facility] EMERGENCY DEPT Provider Note   CSN: [id number] Arrival date & time: 12/08/22  0093     History  No chief complaint on file.   Debbie Griffin is a 42 y.o. female.  The history is provided by the patient and medical records. The history is limited by the condition of the patient. No language interpreter was used.  Altered Mental Status Presenting symptoms: partial responsiveness and unresponsiveness   Presenting symptoms: no confusion   Severity:  Severe Most recent episode:  Today Episode history:  Continuous Timing:  Unable to specify Chronicity:  New Context: recent change in medication   Associated symptoms: fever (subjective), light-headedness, nausea and vomiting   Associated symptoms: no abdominal pain, no agitation, no headaches, no palpitations, no rash and no seizures        Home Medications Prior to Admission medications   Medication Sig Start Date End Date Taking? Authorizing Provider  amoxicillin (AMOXIL) 500 MG tablet Take 500 mg by mouth 3 (three) times daily. 12/06/22   [provider]  oseltamivir (TAMIFLU) 75 MG capsule Take 75 mg by mouth 2 (two) times daily. 12/06/22   [provider]  Prenatal Vit-Fe Fumarate-FA (PRENATAL VITAMIN) 27-0.8 MG TABS Take 1 tablet by mouth daily. 09/03/20   Philip Aspen, DO  ZAFEMY 150-35 MCG/24HR transdermal patch Place 1 patch onto the skin once a week. 11/21/22   [provider]      Allergies    Patient has no known allergies.    Review of Systems   Review of Systems  Constitutional:  Positive for chills, fatigue and fever (subjective).  HENT:  Positive for congestion.   Respiratory:  Positive for cough, chest tightness, shortness of breath and wheezing.   Cardiovascular:  Positive for chest pain. Negative for palpitations.  Gastrointestinal:  Positive for diarrhea, nausea and vomiting. Negative for abdominal pain and constipation.  Genitourinary:  Negative for  dysuria.  Musculoskeletal:  Negative for back pain, neck pain and neck stiffness.  Skin:  Negative for rash and wound.  Neurological:  Positive for light-headedness. Negative for seizures and headaches.  Psychiatric/Behavioral:  Negative for agitation and confusion.   All other systems reviewed and are negative.   Physical Exam Updated Vital Signs BP (!) 138/94   Pulse 87   Temp 99.2 F (37.3 C)   Resp (!) 21   SpO2 100%  Physical Exam Vitals and nursing note reviewed.  Constitutional:      General: She is in acute distress.     Appearance: She is well-developed. She is ill-appearing. She is not toxic-appearing or diaphoretic.  HENT:     Head: Normocephalic and atraumatic.     Nose: Nose normal.     Mouth/Throat:     Mouth: Mucous membranes are dry.  Eyes:     Extraocular Movements: Extraocular movements intact.     Conjunctiva/sclera: Conjunctivae normal.     Pupils: Pupils are equal, round, and reactive to light.  Cardiovascular:     Rate and Rhythm: Regular rhythm. Tachycardia present.     Heart sounds: No murmur heard. Pulmonary:     Effort: Pulmonary effort is normal. No respiratory distress.     Breath sounds: Rhonchi present. No wheezing or rales.  Chest:     Chest wall: No tenderness.  Abdominal:     Palpations: Abdomen is soft.     Tenderness: There is no abdominal tenderness. There is no right CVA tenderness, left CVA tenderness, guarding or rebound.  Musculoskeletal:  General: No swelling or tenderness.     Cervical back: Neck supple.     Right lower leg: No edema.     Left lower leg: No edema.  Skin:    General: Skin is warm and dry.     Capillary Refill: Capillary refill takes less than 2 seconds.     Findings: No erythema or rash.  Neurological:     General: No focal deficit present.     Mental Status: She is alert.     Sensory: No sensory deficit.     Motor: No weakness.  Psychiatric:        Mood and Affect: Mood normal.     ED Results  / Procedures / Treatments   Labs (all labs ordered are listed, but only abnormal results are displayed) Labs Reviewed  RESP PANEL BY RT-PCR (RSV, FLU A&B, COVID)  RVPGX2 - Abnormal; Notable for the following components:      Result Value   Influenza A by PCR POSITIVE (*)    All other components within normal limits  URINALYSIS, ROUTINE W REFLEX MICROSCOPIC - Abnormal; Notable for the following components:   Specific Gravity, Urine >1.046 (*)    Ketones, ur >80 (*)    Protein, ur TRACE (*)    All other components within normal limits  RAPID URINE DRUG SCREEN, HOSP PERFORMED - Abnormal; Notable for the following components:   Benzodiazepines POSITIVE (*)    Tetrahydrocannabinol POSITIVE (*)    All other components within normal limits  CBC WITH DIFFERENTIAL/PLATELET - Abnormal; Notable for the following components:   HCT 35.6 (*)    All other components within normal limits  COMPREHENSIVE METABOLIC PANEL - Abnormal; Notable for the following components:   Chloride 95 (*)    Glucose, Bld 126 (*)    AST 74 (*)    ALT 46 (*)    All other components within normal limits  LACTIC ACID, PLASMA - Abnormal; Notable for the following components:   Lactic Acid, Venous 2.7 (*)    All other components within normal limits  AMMONIA - Abnormal; Notable for the following components:   Ammonia 46 (*)    All other components within normal limits  CBG MONITORING, ED - Abnormal; Notable for the following components:   Glucose-Capillary 108 (*)    All other components within normal limits  TROPONIN I (HIGH SENSITIVITY) - Abnormal; Notable for the following components:   Troponin I (High Sensitivity) 225 (*)    All other components within normal limits  TROPONIN I (HIGH SENSITIVITY) - Abnormal; Notable for the following components:   Troponin I (High Sensitivity) 242 (*)    All other components within normal limits  CULTURE, BLOOD (ROUTINE X 2)  URINE CULTURE  CULTURE, BLOOD (ROUTINE X 2)  LACTIC  ACID, PLASMA  LIPASE, BLOOD  BRAIN NATRIURETIC PEPTIDE    EKG EKG Interpretation  Date/Time:  Thursday December 08 2022 10:39:00 EST Ventricular Rate:  105 PR Interval:  140 QRS Duration: 108 QT Interval:  343 QTC Calculation: 454 R Axis:   57 Text Interpretation: Sinus tachycardia Probable left atrial enlargement RSR' in V1 or V2, right VCD or RVH Nonspecific T abnrm, anterolateral leads Artifact in lead(s) II III aVL aVF V1 V2 V3 V4 V5 V6 when comapred to prior, more artifact.  faster rate. No STEMI Confirmed by Antony Blackbird [phone]) on 12/08/2022 11:10:13 AM  Radiology CT Angio Chest PE W and/or Wo Contrast  Result Date: 12/08/2022 CLINICAL DATA:  Desaturation, tachycardia,  tachypnea, recent viral illness, on birth control medication, question pulmonary embolism. EXAM: CT ANGIOGRAPHY CHEST WITH CONTRAST TECHNIQUE: Multidetector CT imaging of the chest was performed using the standard protocol during bolus administration of intravenous contrast. Multiplanar CT image reconstructions and MIPs were obtained to evaluate the vascular anatomy. RADIATION DOSE REDUCTION: This exam was performed according to the departmental dose-optimization program which includes automated exposure control, adjustment of the mA and/or kV according to patient size and/or use of iterative reconstruction technique. CONTRAST:  89mL OMNIPAQUE IOHEXOL 350 MG/ML SOLN IV COMPARISON:  None Available. FINDINGS: Cardiovascular: Heart unremarkable. No pericardial effusion. Aorta normal caliber without aneurysm or dissection. Pulmonary arteries adequately opacified and patent. No evidence of pulmonary embolism. Mediastinum/Nodes: Esophagus unremarkable. Amorphous tissue in anterior mediastinum question residual thymic tissue. No discrete adenopathy or mass. Lungs/Pleura: Lungs clear. No pulmonary infiltrate, pleural effusion, or pneumothorax. Upper Abdomen: Visualized upper abdomen unremarkable. Musculoskeletal: No acute osseous  findings. Review of the MIP images confirms the above findings. IMPRESSION: No evidence of pulmonary embolism. No acute intrathoracic abnormalities. Electronically Signed   By: Ulyses Southward M.D.   On: 12/08/2022 11:32   DG Chest Portable 1 View  Result Date: 12/08/2022 CLINICAL DATA:  Hypoxia, unresponsive. EXAM: PORTABLE CHEST 1 VIEW COMPARISON:  12/05/2016 FINDINGS: Apical lordotic projection. Left lateral costophrenic angle omitted. The lungs appear clear. Cardiac and mediastinal margins appear normal. No significant bony abnormality. IMPRESSION: 1. No active cardiopulmonary disease is radiographically apparent. 2. Apical lordotic projection. Electronically Signed   By: Gaylyn Rong M.D.   On: 12/08/2022 11:10    Procedures Procedures    CRITICAL CARE Performed by: Canary Brim Notnamed Scholz Total critical care time: 45 minutes Critical care time was exclusive of separately billable procedures and treating other patients. Critical care was necessary to treat or prevent imminent or life-threatening deterioration. Critical care was time spent personally by me on the following activities: development of treatment plan with patient and/or surrogate as well as nursing, discussions with consultants, evaluation of patient's response to treatment, examination of patient, obtaining history from patient or surrogate, ordering and performing treatments and interventions, ordering and review of laboratory studies, ordering and review of radiographic studies, pulse oximetry and re-evaluation of patient's condition.   Medications Ordered in ED Medications  naloxone [facility]) 2 MG/2ML injection (2 mg  Given 12/08/22 1030)  sodium chloride 0.9 % bolus 1,000 mL (0 mLs Intravenous Stopped 12/08/22 1345)  iohexol (OMNIPAQUE) 350 MG/ML injection 100 mL (75 mLs Intravenous Contrast Given 12/08/22 1113)  ondansetron (ZOFRAN) injection 4 mg (4 mg Intravenous Given 12/08/22 1259)    ED Course/ Medical Decision Making/  A&P                           Medical Decision Making Amount and/or Complexity of Data Reviewed Labs: ordered. Radiology: ordered.  Risk Prescription drug management. Decision regarding hospitalization.     Debbie Griffin is a 42 y.o. female with a past medical history significant for verbal report of recent diagnosis of influenza on Tamiflu and recent antibiotic use for dental pain presents for continued nausea, vomiting, diarrhea, chest pain, shortness of breath, fatigue, fevers, and chills.  According to patient, several days ago she was diagnosed with the flu and then yesterday came and waited for 8 hours to be seen but was unable to be seen with nausea, vomiting, and diarrhea.  She reports that has been persistent and then she left the emergency department without getting fully evaluated.  She says that overnight she continued to have the symptoms but was having more chest tightness and shortness of breath.  She presented for reevaluation and while in the waiting room went unresponsive and cyanotic.  Patient quickly was brought back to the evaluation room with her waxing waning mental status.  When she was hooked to the monitor she was found to have oxygen saturation in the mid 20s with a perfect waveform concerning for true hypoxia that was quite profound.  Patient started getting oxygen via bag-valve-mask and supplies were getting assembled for intubation.  Although her pupils were 3 mm and sluggish, we decided to give some Narcan to see if this would help.  Patient did start to have more response and her oxygen saturations began to improve.  Patient now on nonrebreather and answering questions.  Patient says that she started having chest tightness and shortness of breath last night but denies any history of DVT or PE.  She reports she was recently started on birth control medications recently.  She was tachycardic in the 120s, was tachypneic, and very warm to the touch.  Oral temperature  99.2.  Now with oxygen saturations are improving and mental status is improving, she is answering more questions.  We will give her some more fluids and start her workup.  She is now not tachycardic and totally afebrile so we will hold on code sepsis as I suspect is more of a viral syndrome however I do feel need to rule out pneumothorax or even a pulmonary embolism given her birth control use.  Chest x-ray was obtained and I viewed the images at the bedside on the machine and do not see large pneumothorax.  She appears stable for CT.  Due to her profound hypoxia, anticipate she will need admission.  Will check screening labs, get the CT scan, and reassess.  She does note that she took a Xanax last night when she was very anxious about how she was feeling, unclear if this was a combination of accidental benzo overdose or not.  She denies any opiate use.  12:31 PM Just spoke to Dr. Stanford Breed with cardiology who agrees with medicine admission for troponin trending and echo.  He reports that he has seen troponin elevation in the setting of the flu and many patients recently.  Due to her new oxygen requirement and hypoxia, her unresponsive episode and positive troponin, will call for admission.          Final Clinical Impression(s) / ED Diagnoses Final diagnoses:  Hypoxia  Influenza A     Clinical Impression: 1. Hypoxia   2. Influenza A     Disposition: Admit  This note was prepared with assistance of Dragon voice recognition software. Occasional wrong-word or sound-a-like substitutions may have occurred due to the inherent limitations of voice recognition software.      Perline Awe, Gwenyth Allegra, MD 12/08/22 [phone]

## 2022-12-08 NOTE — Assessment & Plan Note (Signed)
Patient describes severe headache Obtain CT head given syncope and a fall. Continue to monitor otherwise denies any neurological complaints.  She does report that she has a bad tooth will see if may need dedicated imaging of the abdomen future

## 2022-12-08 NOTE — Progress Notes (Signed)
Plan of Care Note for accepted transfer   Patient: Debbie Griffin MRN: [id number]   DOA: 12/08/2022  Facility requesting transfer: [facility] Requesting Provider: Dr. Sherry Ruffing Reason for transfer: acute respiratory failure, flu Facility course: 42 yo F w/ no significant PMHx. Presenting w/ shortness of breath, fevers, chills. She was found unresponsive in the ED. She had a recent diagnosis of flu and was on tamiflu outpt, but was not getting any better. She initially came to the ED but left d/t wait. She came back again this morning and became unresponsive while waiting. Sats down to the 20s. W/u showed elevated troponin, elevated lactic acid and flu positive. She has a negative PE scan. Now on 4L . EDP spoke with cards. Rec'd echo, trending trp, and admission to [facility]. They will follow.    Plan of care: The patient is accepted for admission to Progressive unit, at [facility]  While holding at [facility], medical decision making responsibilities remain with the [facility]. Upon arrival to [facility], [place] will assume care. Thank you.   Author: Jonnie Finner, DO 12/08/2022  Check [email or web address] for on-call coverage.  Nursing staff, Please call [place] number on Amion as soon as patient's arrival, so appropriate admitting provider can evaluate the pt.

## 2022-12-08 NOTE — ED Notes (Signed)
Pt bladder scanned as she is unable to void, 0 cc. Will try to obtain urine sample after ivf.

## 2022-12-08 NOTE — ED Notes (Signed)
CRITICAL VALUE STICKER  CRITICAL VALUE:  RECEIVER (on-site recipient of call):Debbie Griffin  DATE & TIME NOTIFIED:  12/08/2022 1150 MESSENGER (representative from lab):  MD NOTIFIED: tegeler  TIME OF NOTIFICATION:12/08/2022 1150  RESPONSE:

## 2022-12-08 NOTE — Subjective & Objective (Signed)
Patient with 6-day history of chills fatigue congestion viral illness was tested positive for hip influenza A.  Started on Tamiflu then developed nausea vomiting diarrhea and hallucinations became very lightheaded try to seek care at the ER but the wait list was too long Could not sleep took Xanax at home then presented back to [facility] this morning and while waiting in the ER had syncopal episode Patient was noted to be unresponsive and cyanotic was brought back to the evaluation room with waxing and waning mental status noted to have evidence of hypoxia with oxygen saturation down in 20s got bag-valve-mask with pupils at 3 mm and sluggish she was given Narcan and started to respond with improved oxygen saturation not requiring intubation any longer  U tox positive for benzos and tetrahydrocannabinol also noted to have elevated troponin up to 242 now downtrending CTA negative for PE  Dr. Stanford Breed was consulted recommended trending troponin and echo

## 2022-12-08 NOTE — Assessment & Plan Note (Signed)
Patient is now 6 days out.  She have had complications with Tamiflu including nausea vomiting diarrhea hallucinations we will hold off on further Tamiflu for right now.  Continue to monitor rehydrate and supportive measures

## 2022-12-08 NOTE — ED Notes (Signed)
Saturation 89% while resting. Placed on 2LPM via [place]. PA notified.

## 2022-12-09 ENCOUNTER — Other Ambulatory Visit: Payer: Self-pay | Admitting: Cardiology

## 2022-12-09 ENCOUNTER — Inpatient Hospital Stay (HOSPITAL_BASED_OUTPATIENT_CLINIC_OR_DEPARTMENT_OTHER)
Admit: 2022-12-09 | Discharge: 2022-12-09 | Disposition: A | Discharge status: Home / Self Care | Payer: Medicaid Other | Attending: Cardiology | Admitting: Cardiology

## 2022-12-09 ENCOUNTER — Inpatient Hospital Stay (HOSPITAL_COMMUNITY): Payer: Medicaid Other

## 2022-12-09 DIAGNOSIS — J9601 Acute respiratory failure with hypoxia: Secondary | ICD-10-CM

## 2022-12-09 DIAGNOSIS — R55 Syncope and collapse: Secondary | ICD-10-CM

## 2022-12-09 DIAGNOSIS — R7989 Other specified abnormal findings of blood chemistry: Secondary | ICD-10-CM

## 2022-12-09 LAB — ECHOCARDIOGRAM COMPLETE
Area-P 1/2: 2.36 cm2
Calc EF: 55 %
Height: 68 in
S' Lateral: 3.3 cm
Single Plane A2C EF: 59.1 %
Single Plane A4C EF: 50.8 %
Weight: 2598.4 oz

## 2022-12-09 LAB — CBC
HCT: 27.3 % — ABNORMAL LOW (ref 36.0–46.0)
Hemoglobin: 9.5 g/dL — ABNORMAL LOW (ref 12.0–15.0)
MCH: 31 pg (ref 26.0–34.0)
MCHC: 34.8 g/dL (ref 30.0–36.0)
MCV: 89.2 fL (ref 80.0–100.0)
Platelets: 105 10*3/uL — ABNORMAL LOW (ref 150–400)
RBC: 3.06 MIL/uL — ABNORMAL LOW (ref 3.87–5.11)
RDW: 12 % (ref 11.5–15.5)
WBC: 3.9 10*3/uL — ABNORMAL LOW (ref 4.0–10.5)
nRBC: 0 % (ref 0.0–0.2)

## 2022-12-09 LAB — COMPREHENSIVE METABOLIC PANEL
ALT: 32 U/L (ref 0–44)
AST: 36 U/L (ref 15–41)
Albumin: 2.9 g/dL — ABNORMAL LOW (ref 3.5–5.0)
Alkaline Phosphatase: 29 U/L — ABNORMAL LOW (ref 38–126)
Anion gap: 5 (ref 5–15)
BUN: 10 mg/dL (ref 6–20)
CO2: 28 mmol/L (ref 22–32)
Calcium: 7.6 mg/dL — ABNORMAL LOW (ref 8.9–10.3)
Chloride: 101 mmol/L (ref 98–111)
Creatinine, Ser: 0.75 mg/dL (ref 0.44–1.00)
GFR, Estimated: >60 mL/min (ref >60)
Glucose, Bld: 164 mg/dL — ABNORMAL HIGH (ref 70–99)
Potassium: 3.2 mmol/L — ABNORMAL LOW (ref 3.5–5.1)
Sodium: 134 mmol/L — ABNORMAL LOW (ref 135–145)
Total Bilirubin: 0.1 mg/dL — ABNORMAL LOW (ref 0.3–1.2)
Total Protein: 4.8 g/dL — ABNORMAL LOW (ref 6.5–8.1)

## 2022-12-09 LAB — TSH: TSH: 3.102 u[IU]/mL (ref 0.350–4.500)

## 2022-12-09 LAB — URINE CULTURE: Culture: NO GROWTH

## 2022-12-09 LAB — TROPONIN I (HIGH SENSITIVITY)
Troponin I (High Sensitivity): 55 ng/L — ABNORMAL HIGH (ref <18)
Troponin I (High Sensitivity): 35 ng/L — ABNORMAL HIGH (ref <18)

## 2022-12-09 LAB — CK: Total CK: 196 U/L (ref 38–234)

## 2022-12-09 LAB — PHOSPHORUS
Phosphorus: 3 mg/dL (ref 2.5–4.6)
Phosphorus: 2.5 mg/dL (ref 2.5–4.6)

## 2022-12-09 LAB — HIV ANTIBODY (ROUTINE TESTING W REFLEX): HIV Screen 4th Generation wRfx: NONREACTIVE

## 2022-12-09 LAB — MAGNESIUM
Magnesium: 2.1 mg/dL (ref 1.7–2.4)
Magnesium: 2 mg/dL (ref 1.7–2.4)

## 2022-12-09 MED ORDER — ONDANSETRON 4 MG PO TBDP: 4.0000 mg | ORAL_TABLET | Freq: Three times a day (TID) | ORAL | 0 refills | Status: DC | PRN

## 2022-12-09 MED ORDER — ORAL CARE MOUTH RINSE: 15.0000 mL | OROMUCOSAL | Status: DC | PRN

## 2022-12-09 MED ORDER — SODIUM CHLORIDE 0.9 % IV BOLUS: 1000.0000 mL | Freq: Once | INTRAVENOUS | Status: AC

## 2022-12-09 MED ORDER — SODIUM CHLORIDE 0.9 % IV BOLUS
1000.0000 mL | Freq: Once | INTRAVENOUS | Status: AC
  Administered 2022-12-09: 1000 mL via INTRAVENOUS

## 2022-12-09 MED ORDER — POTASSIUM CHLORIDE CRYS ER 20 MEQ PO TBCR
40.0000 meq | EXTENDED_RELEASE_TABLET | Freq: Once | ORAL | Status: AC
  Administered 2022-12-09: 40 meq via ORAL
  Filled 2022-12-09: qty 2

## 2022-12-09 MED ORDER — TRAMADOL HCL 50 MG PO TABS
50.0000 mg | ORAL_TABLET | Freq: Four times a day (QID) | ORAL | Status: DC | PRN
  Administered 2022-12-09: 50 mg via ORAL
  Filled 2022-12-09 (×2): qty 1

## 2022-12-09 NOTE — Progress Notes (Signed)
Attending made aware of heart rate dropping into 40's.

## 2022-12-09 NOTE — Assessment & Plan Note (Signed)
In the setting of dehydration, nausea  and vomiting  Using non prescribed benzodiazepine Pt responded well to narcan but Utox did not show narcotics  Denies taking any other meds  Will avoid narcotics  Follow troponin  Echo in AM Monitor on tele  Check orhtostatics

## 2022-12-09 NOTE — Consult Note (Addendum)
Cardiology Consultation   Patient ID: Debbie Griffin Sheehan MRN: [id number]; DOB: 01-Aug-1981  Admit date: 12/08/2022 Date of Consult: 12/09/2022  PCP:  Patient, No Pcp Per   [facility] Providers Cardiologist:  None        Patient Profile:   Debbie Griffin Hollon is a 42 y.o. female with history of anxiety, umbilical hernia, gastric ulcers, opiate use disorder, ovarian cysts who is being seen 12/09/2022 for the evaluation of elevated troponin and T wave inversion at the request of Dr. Jarvis NewcomerGrunz.  History of Present Illness:   Ms. Debbie Griffin reported diagnosis of influenza A several days ago and was started on Tamiflu. She then experienced vomiting, diarrhea, hallucinations and presented to [facility] on 1/3 for evaluation. Appears that she left prior to being seen due to wait times. After returning home that night, she couldn't sleep so she took Xanax. Patient then returned to [facility] on the morning of 1/4. While waiting to be seen, she had what appeared to be a syncopal episode followed by significantly decreased LOC. Patient was noted to be cyanotic with O2 saturation in the 20s. Pupils noted to be 3mm and sluggish. Patient respirations were supported by BVM and she was given Narcan with positive response noted. Patient was admitted to [facility] and transferred to [facility] for further evaluation and management. Cardiology was consulted after troponin checked and found elevated at 242 (now downtrending: 118, 55, 35). ECGs on the evening of 1/4 and morning of 1/5 with new T wave inversions in leads III, AVF, V1, V3, V4.  Today patient reports that aside from this week while actively sick with the flu, she has had no chest pain, shortness of breath, or orthopnea. She regularly exercises, plays soccer, and is active with her 40 year old without having any limiting symptoms. She endorses isolated palpitations. Denies any cardiac history and has not experienced syncopal episodes prior to yesterday. This  week she has had chest and upper back aching and discomfort, exacerbated by coughing.   Past Medical History:  Diagnosis Date   Adjustment disorder with mixed disturbance of emotions and conduct    Anxiety    Dislocation of the knee cap    left 2018   Hernia, umbilical    Miscarriage    Multiple gastric ulcers    Opiate abuse, continuous (HCC)    Ovarian cyst     Past Surgical History:  Procedure Laterality Date   ANTERIOR CRUCIATE LIGAMENT REPAIR Left 01/12/2018   Procedure: LEFT KNEE ANTERIOR CRUCIATE LIGAMENT (ACL) RECONSTRUCTION, PARTIAL MEDIAL MENISCECTOMY;  Surgeon: Cammy Copaean, Gregory Scott, MD;  Location: [facility] OR;  Service: Orthopedics;  Laterality: Left;   ESOPHAGOGASTRODUODENOSCOPY     WISDOM TOOTH EXTRACTION       Home Medications:  Prior to Admission medications   Medication Sig Start Date End Date Taking? Authorizing Provider  amoxicillin (AMOXIL) 500 MG tablet Take 500 mg by mouth 3 (three) times daily. 12/06/22  Yes [provider]  amphetamine-dextroamphetamine (ADDERALL XR) 20 MG 24 hr capsule Take 1 capsule by mouth daily.   Yes [provider]  oseltamivir (TAMIFLU) 75 MG capsule Take 75 mg by mouth 2 (two) times daily. 12/06/22  Yes [provider]  trazodone (DESYREL) 300 MG tablet Take 150 mg by mouth at bedtime.   Yes [provider]  ZAFEMY 150-35 MCG/24HR transdermal patch Place 1 patch onto the skin once a week. 11/21/22  Yes [provider]  Prenatal Vit-Fe Fumarate-FA (PRENATAL VITAMIN) 27-0.8 MG TABS Take  1 tablet by mouth daily. Patient not taking: Reported on 12/08/2022 09/03/20   Debbie Kenner, DO    Inpatient Medications: Scheduled Meds:  Continuous Infusions:  PRN Meds: acetaminophen **OR** acetaminophen, mouth rinse, traMADol  Allergies:   No Known Allergies  Social History:   Social History   Socioeconomic History   Marital status: Divorced    Spouse name: Not on file   Number of children: Not on  file   Years of education: Not on file   Highest education level: Not on file  Occupational History   Not on file  Tobacco Use   Smoking status: Former    Types: Cigarettes   Smokeless tobacco: Never  Vaping Use   Vaping Use: Never used  Substance and Sexual Activity   Alcohol use: Not Currently    Comment: rarely   Drug use: Yes    Types: Marijuana    Comment: Heorin (Past); Marijuana Occ   Sexual activity: Yes    Birth control/protection: None  Other Topics Concern   Not on file  Social History Narrative   Not on file   Social Determinants of Health   Financial Resource Strain: Not on file  Food Insecurity: Not on file  Transportation Needs: Not on file  Physical Activity: Not on file  Stress: Not on file  Social Connections: Not on file  Intimate Partner Violence: Not on file    Family History:    Family History  Problem Relation Age of Onset   Thyroid disease Father    Other Neg Hx      ROS:  Please see the history of present illness.   All other ROS reviewed and negative.     Physical Exam/Data:   Vitals:   12/09/22 1034 12/09/22 1035 12/09/22 1036 12/09/22 1105  BP: 96/67 104/71 102/69 (!) 98/58  Pulse:      Resp:      Temp:      TempSrc:      SpO2: 99%   99%  Weight:      Height:        Intake/Output Summary (Last 24 hours) at 12/09/2022 1137 Last data filed at 12/09/2022 0940 Gross per 24 hour  Intake 2384.33 ml  Output --  Net 2384.33 ml      12/08/2022    9:03 PM 12/07/2022    7:41 PM 08/31/2020    8:14 PM  Last 3 Weights  Weight (lbs) 162 lb 6.4 oz 175 lb 14.8 oz 176 lb  Weight (kg) 73.664 kg 79.8 kg 79.833 kg     Body mass index is 24.69 kg/m.  General:  Well nourished, well developed, in no acute distress HEENT: normal Neck: no JVD Vascular: No carotid bruits; Distal pulses 2+ bilaterally Cardiac:  normal S1, S2; RRR; no murmur  Lungs:  clear to auscultation bilaterally, no wheezing, rhonchi or rales  Abd: soft, nontender, no  hepatomegaly  Ext: no edema Musculoskeletal:  No deformities, BUE and BLE strength normal and equal Skin: warm and dry  Neuro:  CNs 2-12 intact, no focal abnormalities noted Psych:  Normal affect   EKG:  The EKG was personally reviewed and demonstrates:  sinus rhythm with T wave inversions in leads III, AVF as well as V1, V3, V4. Telemetry:  Telemetry was personally reviewed and demonstrates:  sinus rhythm/bradycardia  Relevant CV Studies: IMPRESSIONS     1. Left ventricular ejection fraction, by estimation, is 55 to 60%. The  left ventricle has normal function. The left  ventricle has no regional  wall motion abnormalities. Left ventricular diastolic parameters are  indeterminate.   2. Right ventricular systolic function is normal. The right ventricular  size is normal. There is normal pulmonary artery systolic pressure.   3. No evidence of mitral valve regurgitation.   4. The aortic valve is tricuspid. Aortic valve regurgitation is not  visualized.   5. The inferior vena cava is normal in size with <50% respiratory  variability, suggesting right atrial pressure of 8 mmHg.   Comparison(s): No prior Echocardiogram.   FINDINGS   Left Ventricle: Left ventricular ejection fraction, by estimation, is 55  to 60%. The left ventricle has normal function. The left ventricle has no  regional wall motion abnormalities. The left ventricular internal cavity  size was normal in size. There is   no left ventricular hypertrophy. Left ventricular diastolic parameters  are indeterminate.   Right Ventricle: The right ventricular size is normal. Right ventricular  systolic function is normal. There is normal pulmonary artery systolic  pressure. The tricuspid regurgitant velocity is 1.89 m/s, and with an  assumed right atrial pressure of 8 mmHg,   the estimated right ventricular systolic pressure is 22.3 mmHg.   Left Atrium: Left atrial size was normal in size.   Right Atrium: Right atrial  size was normal in size.   Pericardium: There is no evidence of pericardial effusion.   Mitral Valve: No evidence of mitral valve regurgitation.   Tricuspid Valve: Tricuspid valve regurgitation is trivial.   Aortic Valve: The aortic valve is tricuspid. Aortic valve regurgitation is  not visualized.   Pulmonic Valve: Pulmonic valve regurgitation is trivial.   Aorta: The aortic root and ascending aorta are structurally normal, with  no evidence of dilitation.   Venous: The inferior vena cava is normal in size with less than 50%  respiratory variability, suggesting right atrial pressure of 8 mmHg.   IAS/Shunts: No atrial level shunt detected by color flow Doppler.    Laboratory Data:  High Sensitivity Troponin:   Recent Labs  Lab 12/08/22 1036 12/08/22 1308 12/08/22 1633 12/08/22 2303 12/09/22 0305  TROPONINIHS 225* 242* 118* 55* 35*     Chemistry Recent Labs  Lab 12/07/22 1952 12/08/22 1036 12/08/22 2303 12/09/22 0305  NA 132* 136  --  134*  K 3.9 3.8  --  3.2*  CL 95* 95*  --  101  CO2 27 28  --  28  GLUCOSE 85 126*  --  164*  BUN 13 14  --  10  CREATININE 0.92 0.93  --  0.75  CALCIUM 8.7* 8.9  --  7.6*  MG  --   --  2.1 2.0  GFRNONAA >60 >60  --  >60  ANIONGAP 10 13  --  5    Recent Labs  Lab 12/07/22 1952 12/08/22 1036 12/09/22 0305  PROT 7.1 7.1 4.8*  ALBUMIN 4.5 4.5 2.9*  AST 28 74* 36  ALT 19 46* 32  ALKPHOS 31* 40 29*  BILITOT 0.5 0.5 0.1*   Lipids No results for input(s): "CHOL", "TRIG", "HDL", "LABVLDL", "LDLCALC", "CHOLHDL" in the last 168 hours.  Hematology Recent Labs  Lab 12/07/22 1952 12/08/22 1036 12/09/22 0305  WBC 4.1 8.6 3.9*  RBC 3.93 3.95 3.06*  HGB 12.2 12.3 9.5*  HCT 35.1* 35.6* 27.3*  MCV 89.3 90.1 89.2  MCH 31.0 31.1 31.0  MCHC 34.8 34.6 34.8  RDW 11.8 11.9 12.0  PLT 151 171 105*   Thyroid  Recent Labs  Lab 12/08/22 2303  TSH 3.102    BNP Recent Labs  Lab 12/08/22 1036  BNP 94.9    DDimer No results  for input(s): "DDIMER" in the last 168 hours.   Radiology/Studies:  ECHOCARDIOGRAM COMPLETE  Result Date: 12/09/2022    ECHOCARDIOGRAM REPORT   Patient Name:   Debbie Griffin Challis Date of Exam: 12/09/2022 Medical Rec #:  [id number]      Height:       68.0 in Accession #:    [id number]     Weight:       162.4 lb Date of Birth:  Oct 08, 1981      BSA:          1.871 m Patient Age:    41 years       BP:           92/52 mmHg Patient Gender: F              HR:           46 bpm. Exam Location:  Inpatient Procedure: 2D Echo, Cardiac Doppler and Color Doppler Indications:    Elevated Troponin  History:        Patient has no prior history of Echocardiogram examinations.  Sonographer:    Eulah PontSarah Pirrotta RDCS Referring Phys: Therisa DoyneANASTASSIA DOUTOVA IMPRESSIONS  1. Left ventricular ejection fraction, by estimation, is 55 to 60%. The left ventricle has normal function. The left ventricle has no regional wall motion abnormalities. Left ventricular diastolic parameters are indeterminate.  2. Right ventricular systolic function is normal. The right ventricular size is normal. There is normal pulmonary artery systolic pressure.  3. No evidence of mitral valve regurgitation.  4. The aortic valve is tricuspid. Aortic valve regurgitation is not visualized.  5. The inferior vena cava is normal in size with <50% respiratory variability, suggesting right atrial pressure of 8 mmHg. Comparison(s): No prior Echocardiogram. FINDINGS  Left Ventricle: Left ventricular ejection fraction, by estimation, is 55 to 60%. The left ventricle has normal function. The left ventricle has no regional wall motion abnormalities. The left ventricular internal cavity size was normal in size. There is  no left ventricular hypertrophy. Left ventricular diastolic parameters are indeterminate. Right Ventricle: The right ventricular size is normal. Right ventricular systolic function is normal. There is normal pulmonary artery systolic pressure. The tricuspid regurgitant  velocity is 1.89 m/s, and with an assumed right atrial pressure of 8 mmHg,  the estimated right ventricular systolic pressure is 22.3 mmHg. Left Atrium: Left atrial size was normal in size. Right Atrium: Right atrial size was normal in size. Pericardium: There is no evidence of pericardial effusion. Mitral Valve: No evidence of mitral valve regurgitation. Tricuspid Valve: Tricuspid valve regurgitation is trivial. Aortic Valve: The aortic valve is tricuspid. Aortic valve regurgitation is not visualized. Pulmonic Valve: Pulmonic valve regurgitation is trivial. Aorta: The aortic root and ascending aorta are structurally normal, with no evidence of dilitation. Venous: The inferior vena cava is normal in size with less than 50% respiratory variability, suggesting right atrial pressure of 8 mmHg. IAS/Shunts: No atrial level shunt detected by color flow Doppler.  LEFT VENTRICLE PLAX 2D LVIDd:         4.30 cm      Diastology LVIDs:         3.30 cm      LV e' medial:    8.70 cm/s LV PW:         0.90 cm      LV E/e' medial:  8.7 LV IVS:        0.90 cm      LV e' lateral:   16.30 cm/s LVOT diam:     2.00 cm      LV E/e' lateral: 4.7 LV SV:         57 LV SV Index:   30 LVOT Area:     3.14 cm  LV Volumes (MOD) LV vol d, MOD A2C: 128.0 ml LV vol d, MOD A4C: 114.0 ml LV vol s, MOD A2C: 52.4 ml LV vol s, MOD A4C: 56.1 ml LV SV MOD A2C:     75.6 ml LV SV MOD A4C:     114.0 ml LV SV MOD BP:      66.5 ml RIGHT VENTRICLE RV S prime:     10.60 cm/s TAPSE (M-mode): 1.9 cm LEFT ATRIUM           Index        RIGHT ATRIUM           Index LA diam:      3.10 cm 1.66 cm/m   RA Area:     13.90 cm LA Vol (A2C): 46.5 ml 24.86 ml/m  RA Volume:   36.70 ml  19.62 ml/m LA Vol (A4C): 42.1 ml 22.51 ml/m  AORTIC VALVE LVOT Vmax:   82.50 cm/s LVOT Vmean:  50.900 cm/s LVOT VTI:    0.181 m  AORTA Ao Root diam: 3.00 cm Ao Asc diam:  2.90 cm MITRAL VALVE               TRICUSPID VALVE MV Area (PHT): 2.36 cm    TR Peak grad:   14.3 mmHg MV Decel Time:  322 msec    TR Vmax:        189.00 cm/s MV E velocity: 75.90 cm/s MV A velocity: 22.20 cm/s  SHUNTS MV E/A ratio:  3.42        Systemic VTI:  0.18 m                            Systemic Diam: 2.00 cm Photographer signed by Carolan Clines Signature Date/Time: 12/09/2022/10:15:46 AM    Final    CT HEAD WO CONTRAST ( )  Result Date: 12/09/2022 CLINICAL DATA:  Headache, sudden, severe EXAM: CT HEAD WITHOUT CONTRAST TECHNIQUE: Contiguous axial images were obtained from the base of the skull through the vertex without intravenous contrast. RADIATION DOSE REDUCTION: This exam was performed according to the departmental dose-optimization program which includes automated exposure control, adjustment of the mA and/or kV according to patient size and/or use of iterative reconstruction technique. COMPARISON:  None Available. FINDINGS: Brain: No evidence of acute infarction, hemorrhage, mass, mass effect, or midline shift. No hydrocephalus or extra-axial fluid collection. Vascular: No hyperdense vessel. Skull: Normal. Negative for fracture or focal lesion. Sinuses/Orbits: Mucous retention cyst and mild mucosal thickening in the right maxillary sinus. Mucosal thickening in the left sphenoid sinus. No acute finding in the orbits. Other: The mastoid air cells are well aerated. IMPRESSION: No acute intracranial process. No etiology seen for the patient's headaches Electronically Signed   By: Wiliam Ke M.D.   On: 12/09/2022 01:07   CT Angio Chest PE W and/or Wo Contrast  Result Date: 12/08/2022 CLINICAL DATA:  Desaturation, tachycardia, tachypnea, recent viral illness, on birth control medication, question pulmonary embolism. EXAM: CT ANGIOGRAPHY CHEST WITH CONTRAST TECHNIQUE: Multidetector CT imaging of the chest was performed  using the standard protocol during bolus administration of intravenous contrast. Multiplanar CT image reconstructions and MIPs were obtained to evaluate the vascular anatomy. RADIATION DOSE  REDUCTION: This exam was performed according to the departmental dose-optimization program which includes automated exposure control, adjustment of the mA and/or kV according to patient size and/or use of iterative reconstruction technique. CONTRAST:  44mL OMNIPAQUE IOHEXOL 350 MG/ML SOLN IV COMPARISON:  None Available. FINDINGS: Cardiovascular: Heart unremarkable. No pericardial effusion. Aorta normal caliber without aneurysm or dissection. Pulmonary arteries adequately opacified and patent. No evidence of pulmonary embolism. Mediastinum/Nodes: Esophagus unremarkable. Amorphous tissue in anterior mediastinum question residual thymic tissue. No discrete adenopathy or mass. Lungs/Pleura: Lungs clear. No pulmonary infiltrate, pleural effusion, or pneumothorax. Upper Abdomen: Visualized upper abdomen unremarkable. Musculoskeletal: No acute osseous findings. Review of the MIP images confirms the above findings. IMPRESSION: No evidence of pulmonary embolism. No acute intrathoracic abnormalities. Electronically Signed   By: Lavonia Dana M.D.   On: 12/08/2022 11:32   DG Chest Portable 1 View  Result Date: 12/08/2022 CLINICAL DATA:  Hypoxia, unresponsive. EXAM: PORTABLE CHEST 1 VIEW COMPARISON:  12/05/2016 FINDINGS: Apical lordotic projection. Left lateral costophrenic angle omitted. The lungs appear clear. Cardiac and mediastinal margins appear normal. No significant bony abnormality. IMPRESSION: 1. No active cardiopulmonary disease is radiographically apparent. 2. Apical lordotic projection. Electronically Signed   By: Van Clines M.D.   On: 12/08/2022 11:10     Assessment and Plan:   Elevated troponin Abnormal ECG  Patient with influenza A admitted following syncopal episode/acute hypoxia while in [facility] ED. She recovered with Narcan and BVM and did not require any advance airway management. Troponin subsequently checked and found elevated but down-trending: 225->242->118->55->35. ECGs following this  episode noted to have new T wave inversions. TTE completed today found with normal LVEF 55-60% and no regional wall motion abnormalities. No valvular abnormalities. Telemetry is reassuring without arrhythmia.   Overall low concern for ongoing ischemic cardiac process. Suspect that patient's syncope was secondary to hypoxia rather than arrhythmia. Down-trending troponin and normal TTE also reassuring. Will repeat ECG this morning.  ECG changes may be due to influenza induced myocarditis. Will arrange for outpatient cardiology follow-up for repeat ECG. Plan for live heart monitor at discharge.   Risk Assessment/Risk Scores:           For questions or updates, please contact [place] Please consult [email or web address] for contact info under    Signed, Lily Kocher, PA-C  12/09/2022 11:37 AM  History and all data above reviewed.  Patient examined.  I agree with the findings as above.  Patient has no past cardiac history.  She has felt well in the past.  She is very active.  She denies any cardiovascular symptoms and can be as physical as playing competitive soccer.  She is never had any cardiac workup.  She developed flulike symptoms for about a week.  She has been getting worse and apparently had some syncopal episodes witnessed by her family.  She does not recall the events.   There is a description as above.  She did have an abnormal EKG with T wave inversions.  The objective finding has been hypokalemia but otherwise no significant findings on her extensive workup as above.  Follow-up EKG this afternoon is normalized.  This demonstrated sinus rhythm 47, RSR prime V1 and V2, intervals within normal limits, no acute ST-T wave changes.  Other than the events leading to this admission she denies any cardiovascular symptoms.  The patient  denies any new symptoms such as chest discomfort, neck or arm discomfort. There has been no new shortness of breath, PND or orthopnea. There have been no  reported palpitations, presyncope or syncope.  The patient exam reveals COR:RRR  ,  Lungs: Clear  ,  Abd: Positive bowel sounds, no rebound no guarding, Ext No edema  .  All available labs, radiology testing, previous records reviewed. Agree with documented assessment and plan.  Syncope: This is probably related to dehydration.  The abnormal EKG was likely related to this and electrolyte abnormalities.  T wave inversions normalized.  Echo was unremarkable.  Given this event and this EKG I will have her wear a 2-week monitor.  However, I do not suspect any other cardiac workup.  Elevated troponin: I think this is secondary.  There is no coronary calcium noted on her CT.  She does have risk factors.  She is otherwise been very physically active.  Not suggesting further cardiac workup but we will see her back in the office to review after her 2-week monitor.  Fayrene Fearing Kaylani Fromme  2:15 PM  12/09/2022

## 2022-12-09 NOTE — Progress Notes (Signed)
Seen and examined by Dr. Roel Cluck.

## 2022-12-09 NOTE — Discharge Summary (Signed)
Physician Discharge Summary   Patient: Debbie Griffin MRN: [id number] DOB: 28-Jun-1981  Admit date:     12/08/2022  Discharge date: {dischdate:26783}  Discharge Physician: Tyrone Nine   PCP: Patient, No Pcp Per   Recommendations at discharge:  {Tip this will not be part of the note when signed- Example include specific recommendations for outpatient follow-up, pending tests to follow-up on. (Optional):26781}  ***  Discharge Diagnoses: Principal Problem:   Acute hypoxic respiratory failure (HCC) Active Problems:   Influenza A   Headache   Elevated troponin   Syncope, vasovagal  Resolved Problems:   * No resolved hospital problems. [facility] Course: No notes on file  Assessment and Plan: No notes have been filed under this hospital service. Service: Hospitalist     {Tip this will not be part of the note when signed Body mass index is 24.69 kg/m. , ,  (Optional):26781}  {(NOTE) Pain control PDMP Statment (Optional):26782} Consultants: *** Procedures performed: ***  Disposition: {Plan; Disposition:26390} Diet recommendation:  {Diet_Plan:26776} DISCHARGE MEDICATION: Allergies as of 12/09/2022   No Known Allergies      Medication List     STOP taking these medications    oseltamivir 75 MG capsule Commonly known as: TAMIFLU       TAKE these medications    amoxicillin 500 MG tablet Commonly known as: AMOXIL Take 500 mg by mouth 3 (three) times daily.   amphetamine-dextroamphetamine 20 MG 24 hr capsule Commonly known as: ADDERALL XR Take 1 capsule by mouth daily.   ondansetron 4 MG disintegrating tablet Commonly known as: ZOFRAN-ODT Take 1 tablet (4 mg total) by mouth every 8 (eight) hours as needed for nausea or vomiting.   Prenatal Vitamin 27-0.8 MG Tabs Take 1 tablet by mouth daily.   trazodone 300 MG tablet Commonly known as: DESYREL Take 150 mg by mouth at bedtime.   Zafemy 150-35 MCG/24HR transdermal patch Generic drug:  norelgestromin-ethinyl estradiol Place 1 patch onto the skin once a week.        Discharge Exam: Filed Weights   12/08/22 2103  Weight: 73.7 kg   ***  Condition at discharge: {DC Condition:26389}  The results of significant diagnostics from this hospitalization (including imaging, microbiology, ancillary and laboratory) are listed below for reference.   Imaging Studies: ECHOCARDIOGRAM COMPLETE  Result Date: 12/09/2022    ECHOCARDIOGRAM REPORT   Patient Name:   Debbie Griffin Date of Exam: 12/09/2022 Medical Rec #:  [id number]      Height:       68.0 in Accession #:    [id number]     Weight:       162.4 lb Date of Birth:  1981/09/08      BSA:          1.871 m Patient Age:    42 years       BP:           92/52 mmHg Patient Gender: F              HR:           46 bpm. Exam Location:  Inpatient Procedure: 2D Echo, Cardiac Doppler and Color Doppler Indications:    Elevated Troponin  History:        Patient has no prior history of Echocardiogram examinations.  Sonographer:    Eulah Pont RDCS Referring Phys: Therisa Doyne IMPRESSIONS  1. Left ventricular ejection fraction, by estimation, is 55 to 60%. The left ventricle has normal function. The left ventricle  has no regional wall motion abnormalities. Left ventricular diastolic parameters are indeterminate.  2. Right ventricular systolic function is normal. The right ventricular size is normal. There is normal pulmonary artery systolic pressure.  3. No evidence of mitral valve regurgitation.  4. The aortic valve is tricuspid. Aortic valve regurgitation is not visualized.  5. The inferior vena cava is normal in size with <50% respiratory variability, suggesting right atrial pressure of 8 mmHg. Comparison(s): No prior Echocardiogram. FINDINGS  Left Ventricle: Left ventricular ejection fraction, by estimation, is 55 to 60%. The left ventricle has normal function. The left ventricle has no regional wall motion abnormalities. The left ventricular  internal cavity size was normal in size. There is  no left ventricular hypertrophy. Left ventricular diastolic parameters are indeterminate. Right Ventricle: The right ventricular size is normal. Right ventricular systolic function is normal. There is normal pulmonary artery systolic pressure. The tricuspid regurgitant velocity is 1.89 m/s, and with an assumed right atrial pressure of 8 mmHg,  the estimated right ventricular systolic pressure is 09.3 mmHg. Left Atrium: Left atrial size was normal in size. Right Atrium: Right atrial size was normal in size. Pericardium: There is no evidence of pericardial effusion. Mitral Valve: No evidence of mitral valve regurgitation. Tricuspid Valve: Tricuspid valve regurgitation is trivial. Aortic Valve: The aortic valve is tricuspid. Aortic valve regurgitation is not visualized. Pulmonic Valve: Pulmonic valve regurgitation is trivial. Aorta: The aortic root and ascending aorta are structurally normal, with no evidence of dilitation. Venous: The inferior vena cava is normal in size with less than 50% respiratory variability, suggesting right atrial pressure of 8 mmHg. IAS/Shunts: No atrial level shunt detected by color flow Doppler.  LEFT VENTRICLE PLAX 2D LVIDd:         4.30 cm      Diastology LVIDs:         3.30 cm      LV e' medial:    8.70 cm/s LV PW:         0.90 cm      LV E/e' medial:  8.7 LV IVS:        0.90 cm      LV e' lateral:   16.30 cm/s LVOT diam:     2.00 cm      LV E/e' lateral: 4.7 LV SV:         57 LV SV Index:   30 LVOT Area:     3.14 cm  LV Volumes (MOD) LV vol d, MOD A2C: 128.0 ml LV vol d, MOD A4C: 114.0 ml LV vol s, MOD A2C: 52.4 ml LV vol s, MOD A4C: 56.1 ml LV SV MOD A2C:     75.6 ml LV SV MOD A4C:     114.0 ml LV SV MOD BP:      66.5 ml RIGHT VENTRICLE RV S prime:     10.60 cm/s TAPSE (M-mode): 1.9 cm LEFT ATRIUM           Index        RIGHT ATRIUM           Index LA diam:      3.10 cm 1.66 cm/m   RA Area:     13.90 cm LA Vol (A2C): 46.5 ml 24.86  ml/m  RA Volume:   36.70 ml  19.62 ml/m LA Vol (A4C): 42.1 ml 22.51 ml/m  AORTIC VALVE LVOT Vmax:   82.50 cm/s LVOT Vmean:  50.900 cm/s LVOT VTI:    0.181 m  AORTA Ao Root diam: 3.00 cm Ao Asc diam:  2.90 cm MITRAL VALVE               TRICUSPID VALVE MV Area (PHT): 2.36 cm    TR Peak grad:   14.3 mmHg MV Decel Time: 322 msec    TR Vmax:        189.00 cm/s MV E velocity: 75.90 cm/s MV A velocity: 22.20 cm/s  SHUNTS MV E/A ratio:  3.42        Systemic VTI:  0.18 m                            Systemic Diam: 2.00 cm Carolan Clines Electronically signed by Carolan Clines Signature Date/Time: 12/09/2022/10:15:46 AM    Final    CT HEAD WO CONTRAST ( )  Result Date: 12/09/2022 CLINICAL DATA:  Headache, sudden, severe EXAM: CT HEAD WITHOUT CONTRAST TECHNIQUE: Contiguous axial images were obtained from the base of the skull through the vertex without intravenous contrast. RADIATION DOSE REDUCTION: This exam was performed according to the departmental dose-optimization program which includes automated exposure control, adjustment of the mA and/or kV according to patient size and/or use of iterative reconstruction technique. COMPARISON:  None Available. FINDINGS: Brain: No evidence of acute infarction, hemorrhage, mass, mass effect, or midline shift. No hydrocephalus or extra-axial fluid collection. Vascular: No hyperdense vessel. Skull: Normal. Negative for fracture or focal lesion. Sinuses/Orbits: Mucous retention cyst and mild mucosal thickening in the right maxillary sinus. Mucosal thickening in the left sphenoid sinus. No acute finding in the orbits. Other: The mastoid air cells are well aerated. IMPRESSION: No acute intracranial process. No etiology seen for the patient's headaches Electronically Signed   By: Wiliam Ke M.D.   On: 12/09/2022 01:07   CT Angio Chest PE W and/or Wo Contrast  Result Date: 12/08/2022 CLINICAL DATA:  Desaturation, tachycardia, tachypnea, recent viral illness, on birth control medication,  question pulmonary embolism. EXAM: CT ANGIOGRAPHY CHEST WITH CONTRAST TECHNIQUE: Multidetector CT imaging of the chest was performed using the standard protocol during bolus administration of intravenous contrast. Multiplanar CT image reconstructions and MIPs were obtained to evaluate the vascular anatomy. RADIATION DOSE REDUCTION: This exam was performed according to the departmental dose-optimization program which includes automated exposure control, adjustment of the mA and/or kV according to patient size and/or use of iterative reconstruction technique. CONTRAST:  75mL OMNIPAQUE IOHEXOL 350 MG/ML SOLN IV COMPARISON:  None Available. FINDINGS: Cardiovascular: Heart unremarkable. No pericardial effusion. Aorta normal caliber without aneurysm or dissection. Pulmonary arteries adequately opacified and patent. No evidence of pulmonary embolism. Mediastinum/Nodes: Esophagus unremarkable. Amorphous tissue in anterior mediastinum question residual thymic tissue. No discrete adenopathy or mass. Lungs/Pleura: Lungs clear. No pulmonary infiltrate, pleural effusion, or pneumothorax. Upper Abdomen: Visualized upper abdomen unremarkable. Musculoskeletal: No acute osseous findings. Review of the MIP images confirms the above findings. IMPRESSION: No evidence of pulmonary embolism. No acute intrathoracic abnormalities. Electronically Signed   By: Ulyses Southward M.D.   On: 12/08/2022 11:32   DG Chest Portable 1 View  Result Date: 12/08/2022 CLINICAL DATA:  Hypoxia, unresponsive. EXAM: PORTABLE CHEST 1 VIEW COMPARISON:  12/05/2016 FINDINGS: Apical lordotic projection. Left lateral costophrenic angle omitted. The lungs appear clear. Cardiac and mediastinal margins appear normal. No significant bony abnormality. IMPRESSION: 1. No active cardiopulmonary disease is radiographically apparent. 2. Apical lordotic projection. Electronically Signed   By: Gaylyn Rong M.D.   On: 12/08/2022 11:10    Microbiology:  Results for orders  placed or performed during the hospital encounter of 12/08/22  Blood culture (routine x 2)     Status: None (Preliminary result)   Collection Time: 12/08/22 10:36 AM   Specimen: BLOOD  Result Value Ref Range Status   Specimen Description   Final    BLOOD LEFT ANTECUBITAL Performed at [facility], [place] [address] [place], [place] [address]    Special Requests   Final    BOTTLES DRAWN AEROBIC AND ANAEROBIC Blood Culture adequate volume Performed at [facility], [address], [place], [place] [address]    Culture   Final    NO GROWTH < 24 HOURS Performed at [facility], [place] [address] [place], [place] [address]    Report Status PENDING  Incomplete  Resp panel by RT-PCR (RSV, Flu A&B, Covid) Anterior Nasal Swab     Status: Abnormal   Collection Time: 12/08/22 10:48 AM   Specimen: Anterior Nasal Swab  Result Value Ref Range Status   SARS Coronavirus 2 by RT PCR NEGATIVE NEGATIVE Final    Comment: (NOTE) SARS-CoV-2 target nucleic acids are NOT DETECTED.  The SARS-CoV-2 RNA is generally detectable in upper respiratory specimens during the acute phase of infection. The lowest concentration of SARS-CoV-2 viral copies this assay can detect is 138 copies/mL. A negative result does not preclude SARS-Cov-2 infection and should not be used as the sole basis for treatment or other patient management decisions. A negative result may occur with  improper specimen collection/handling, submission of specimen other than nasopharyngeal swab, presence of viral mutation(s) within the areas targeted by this assay, and inadequate number of viral copies(<138 copies/mL). A negative result must be combined with clinical observations, patient history, and epidemiological information. The expected result is Negative.  Fact Sheet for Patients:  EntrepreneurPulse.com.au  Fact Sheet for Healthcare Providers:   IncredibleEmployment.be  This test is no t yet approved or cleared by the [place] FDA and  has been authorized for detection and/or diagnosis of SARS-CoV-2 by FDA under an Emergency Use Authorization (EUA). This EUA will remain  in effect (meaning this test can be used) for the duration of the COVID-19 declaration under Section 564(b)(1) of the Act, 21 U.S.C.section 360bbb-3(b)(1), unless the authorization is terminated  or revoked sooner.       Influenza A by PCR POSITIVE (A) NEGATIVE Final   Influenza B by PCR NEGATIVE NEGATIVE Final    Comment: (NOTE) The Xpert Xpress SARS-CoV-2/FLU/RSV plus assay is intended as an aid in the diagnosis of influenza from Nasopharyngeal swab specimens and should not be used as a sole basis for treatment. Nasal washings and aspirates are unacceptable for Xpert Xpress SARS-CoV-2/FLU/RSV testing.  Fact Sheet for Patients: EntrepreneurPulse.com.au  Fact Sheet for Healthcare Providers: IncredibleEmployment.be  This test is not yet approved or cleared by the [place] FDA and has been authorized for detection and/or diagnosis of SARS-CoV-2 by FDA under an Emergency Use Authorization (EUA). This EUA will remain in effect (meaning this test can be used) for the duration of the COVID-19 declaration under Section 564(b)(1) of the Act, 21 U.S.C. section 360bbb-3(b)(1), unless the authorization is terminated or revoked.     Resp Syncytial Virus by PCR NEGATIVE NEGATIVE Final    Comment: (NOTE) Fact Sheet for Patients: EntrepreneurPulse.com.au  Fact Sheet for Healthcare Providers: IncredibleEmployment.be  This test is not yet approved or cleared by the [place] FDA and has been authorized for detection and/or diagnosis of SARS-CoV-2 by  FDA under an Emergency Use Authorization (EUA). This EUA will remain in effect (meaning this test can be used)  for the duration of the COVID-19 declaration under Section 564(b)(1) of the Act, 21 U.S.C. section 360bbb-3(b)(1), unless the authorization is terminated or revoked.  Performed at [facility], [address] [place], [place] [address]   Blood culture (routine x 2)     Status: None (Preliminary result)   Collection Time: 12/08/22  1:08 PM   Specimen: BLOOD LEFT HAND  Result Value Ref Range Status   Specimen Description   Final    BLOOD LEFT HAND Performed at [facility], [address] [place], [place] [address]    Special Requests   Final    BOTTLES DRAWN AEROBIC AND ANAEROBIC Blood Culture adequate volume Performed at [facility], [address], [place], [place] [address]    Culture   Final    NO GROWTH < 24 HOURS Performed at [facility], [address] [place], [place] [address]    Report Status PENDING  Incomplete    Labs: CBC: Recent Labs  Lab 12/07/22 1952 12/08/22 1036 12/09/22 0305  WBC 4.1 8.6 3.9*  NEUTROABS  --  5.9  --   HGB 12.2 12.3 9.5*  HCT 35.1* 35.6* 27.3*  MCV 89.3 90.1 89.2  PLT 151 171 105*   Basic Metabolic Panel: Recent Labs  Lab 12/07/22 1952 12/08/22 1036 12/08/22 2303 12/09/22 0305  NA 132* 136  --  134*  K 3.9 3.8  --  3.2*  CL 95* 95*  --  101  CO2 27 28  --  28  GLUCOSE 85 126*  --  164*  BUN 13 14  --  10  CREATININE 0.92 0.93  --  0.75  CALCIUM 8.7* 8.9  --  7.6*  MG  --   --  2.1 2.0  PHOS  --   --  3.0 2.5   Liver Function Tests: Recent Labs  Lab 12/07/22 1952 12/08/22 1036 12/09/22 0305  AST 28 74* 36  ALT 19 46* 32  ALKPHOS 31* 40 29*  BILITOT 0.5 0.5 0.1*  PROT 7.1 7.1 4.8*  ALBUMIN 4.5 4.5 2.9*   CBG: Recent Labs  Lab 12/08/22 1030  GLUCAP 108*    Discharge time spent: {LESS THAN/GREATER THAN:26388} 30 minutes.  Signed: Tyrone Nine, MD [facility] 12/09/2022

## 2022-12-09 NOTE — Progress Notes (Signed)
Echocardiogram 2D Echocardiogram has been performed.  Debbie Griffin 12/09/2022, 10:10 AM

## 2022-12-13 LAB — CULTURE, BLOOD (ROUTINE X 2)
Culture: NO GROWTH
Culture: NO GROWTH
Special Requests: ADEQUATE
Special Requests: ADEQUATE

## 2022-12-29 ENCOUNTER — Telehealth: Payer: Self-pay | Admitting: Cardiology

## 2022-12-29 NOTE — Telephone Encounter (Signed)
Spoke with pt, she was recently in the hospital with syncope and she reports last night she did not feel well. She was evaluated by EMT and her vital signs were good by her report. Today she continues to feel bad with dizziness and feeling faint. She does not have a way to check her blood pressure. She has not eaten anything today so told patient to eat something with salt and to drink plenty of fluids, avoiding caffeine. Discussed with dr hochrein and he agreed to the above. Patient appointment moved up to tomorrow afternoon to see dr hochrein. Patient voiced understanding to go to the ER if she feels worse or passes out. Pt agreed with this plan.

## 2022-12-29 NOTE — Telephone Encounter (Signed)
Patient c/o Palpitations:  High priority if patient c/o lightheadedness, shortness of breath, or chest pain  How long have you had palpitations/irregular HR/ Afib? Are you having the symptoms now? Yes   Are you currently experiencing lightheadedness, SOB or CP? All 3   Do you have a history of afib (atrial fibrillation) or irregular heart rhythm? yes  Have you checked your BP or HR? (document readings if available):    Are you experiencing any other symptoms? Dizziness

## 2022-12-29 NOTE — Progress Notes (Signed)
Cardiology Office Note   Date:  12/30/2022   ID:  Debbie Griffin, DOB [birth year], MRN [id number]  PCP:  Patient, No Pcp Per  Cardiologist:   None   Chief Complaint  Patient presents with   Loss of Consciousness   Dizziness      History of Present Illness: Debbie Griffin is a 42 y.o. female who presents for dizziness  In early January she had influenza A several days ago and was started on Tamiflu. She then experienced vomiting, diarrhea, hallucinations and presented to [facility] ED on 1/3 for evaluation. Because of wait times she left without being seen initially.   After returning home that night, she couldn't sleep so she took Xanax. She then returned to [facility] on the morning of 1/4. While waiting to be seen, she had what appeared to be a syncopal episode followed by significantly decreased LOC. She was noted to be cyanotic with O2 saturation in the 20s. Her pupils were noted to be 9mm and sluggish. Respirations were supported by BVM and she was given Narcan with positive response noted. She was admitted to [facility] and transferred to [facility] for further evaluation and management. Cardiology was consulted after troponin checked and found elevated at 242 (now downtrending: 118, 55, 35). ECGs on the evening of 1/4 and morning of 1/5 with new T wave inversions in leads III, AVF, V1, V3, V4.   Echo was unremarkable and EKG normalized.  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  She does not recall all of the details of this event.  She said that she just felt very dizzy.  She did go down to the ground but she did not actually pass out she does not think.  There was no trauma.  She said this was not as severe as the episode [facility].  She had a couple of episodes of apparent syncope around the time of her flu before going to the hospital.  Prior to that she been doing well.  In between the hospitalization and now she is on  snowboarding and had done okay.  She is been able to work.  She is not describing new chest pressure, neck or arm discomfort.  She does have some occasional orthostatic symptoms.  She is not had any tachypalpitations.  She wore an event monitor and just mailed this in.  She did not have it on when she had her significant dizzy episode.   Past Medical History:  Diagnosis Date   Adjustment disorder with mixed disturbance of emotions and conduct    Anxiety    Dislocation of the knee cap    left 2018   Hernia, umbilical    Miscarriage    Multiple gastric ulcers    Opiate abuse, continuous (HCC)    Ovarian cyst     Past Surgical History:  Procedure Laterality Date   ANTERIOR CRUCIATE LIGAMENT REPAIR Left 01/12/2018   Procedure: LEFT KNEE ANTERIOR CRUCIATE LIGAMENT (ACL) RECONSTRUCTION, PARTIAL MEDIAL MENISCECTOMY;  Surgeon: Cammy Copa, MD;  Location: [facility] OR;  Service: Orthopedics;  Laterality: Left;   ESOPHAGOGASTRODUODENOSCOPY     WISDOM TOOTH EXTRACTION       Current Outpatient Medications  Medication Sig Dispense Refill   trazodone (DESYREL) 300 MG tablet Take 150 mg by mouth at bedtime.     No current facility-administered medications for this visit.    Allergies:   Patient has no known allergies.    Social History:  The  patient  reports that she has quit smoking. Her smoking use included cigarettes. She has never used smokeless tobacco. She reports that she does not currently use alcohol. She reports current drug use. Drug: Marijuana.   Family History:  The patient's family history includes Thyroid disease in her father.    ROS:  Please see the history of present illness.   Otherwise, review of systems are positive for none.   All other systems are reviewed and negative.    PHYSICAL EXAM: VS:  BP 120/72 (BP Location: Left Arm, Patient Position: Sitting, Cuff Size: Normal)   Pulse 99   Ht 5\' 8"  (1.727 m)   Wt 155 lb (70.3 kg)   BMI 23.57 kg/m  , BMI Body mass  index is 23.57 kg/m. GENERAL:  Well appearing HEENT:  Pupils equal round and reactive, fundi not visualized, oral mucosa unremarkable NECK:  No jugular venous distention, waveform within normal limits, carotid upstroke brisk and symmetric, no bruits, no thyromegaly LYMPHATICS:  No cervical, inguinal adenopathy LUNGS:  Clear to auscultation bilaterally BACK:  No CVA tenderness CHEST:  Unremarkable HEART:  PMI not displaced or sustained,S1 and S2 within normal limits, no S3, no S4, no clicks, no rubs, no murmurs ABD:  Flat, positive bowel sounds normal in frequency in pitch, no bruits, no rebound, no guarding, no midline pulsatile mass, no hepatomegaly, no splenomegaly EXT:  2 plus pulses throughout, no edema, no cyanosis no clubbing SKIN:  No rashes no nodules NEURO:  Cranial nerves II through XII grossly intact, motor grossly intact throughout PSYCH:  Cognitively intact, oriented to person place and time    EKG:  EKG is ordered today. The ekg ordered today demonstrates sinus rhythm, rate 99, axis within normal limits, intervals within normal limits, RSR prime V1 and V2, no acute ST-T wave changes.   Recent Labs: 12/08/2022: B Natriuretic Peptide 94.9; TSH 3.102 12/09/2022: ALT 32; BUN 10; Creatinine, Ser 0.75; Hemoglobin 9.5; Magnesium 2.0; Platelets 105; Potassium 3.2; Sodium 134    Lipid Panel No results found for: "CHOL", "TRIG", "HDL", "CHOLHDL", "VLDL", "LDLCALC", "LDLDIRECT"    Wt Readings from Last 3 Encounters:  12/30/22 155 lb (70.3 kg)  12/08/22 162 lb 6.4 oz (73.7 kg)  08/31/20 176 lb (79.8 kg)      Other studies Reviewed: Additional studies/ records that were reviewed today include: None. Review of the above records demonstrates:  Please see elsewhere in the note.     ASSESSMENT AND PLAN:  SYNCOPE: The episode is unclear.  She was not orthostatic in the office today although her heart rate did go up a little bit.  There could be some component of postural  orthostatic symptoms.  We talked about hydration and salt loading.  We talked about compression garments.  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  I am going to look for the results of the monitor and she might need an implanted monitor.  She should also consider neurology evaluation.  ABNORMAL EKG: She had transient QT prolongation T wave inversion on EKG in the hospital that resolved.  Today's EKG is unremarkable.  Monitor as above.   Current medicines are reviewed at length with the patient today.  The patient does not have concerns regarding medicines.  The following changes have been made:  no change  Labs/ tests ordered today include: None No orders of the defined types were placed in this encounter.    Disposition:   FU with me APP in one month.  Signed, Minus Breeding, MD  12/30/2022 5:33 PM    [place]

## 2022-12-30 ENCOUNTER — Ambulatory Visit: Payer: Medicaid Other | Attending: Cardiology | Admitting: Cardiology

## 2022-12-30 ENCOUNTER — Encounter: Payer: Self-pay | Admitting: Cardiology

## 2022-12-30 VITALS — BP 120/72 | HR 99 | Ht 68.0 in | Wt 155.0 lb

## 2022-12-30 DIAGNOSIS — R42 Dizziness and giddiness: Secondary | ICD-10-CM | POA: Diagnosis present

## 2022-12-30 NOTE — Patient Instructions (Signed)
Medication Instructions:  Your physician recommends that you continue on your current medications as directed. Please refer to the Current Medication list given to you today.  *If you need a refill on your cardiac medications before your next appointment, please call your pharmacy*   Lab Work: NONE If you have labs (blood work) drawn today and your tests are completely normal, you will receive your results only by: [place] (if you have MyChart) OR A paper copy in the mail If you have any lab test that is abnormal or we need to change your treatment, we will call you to review the results.   Testing/Procedures: NONE   Follow-Up: At [facility], you and your health needs are our priority.  As part of our continuing mission to provide you with exceptional heart care, we have created designated Provider Care Teams.  These Care Teams include your primary Cardiologist (physician) and Advanced Practice Providers (APPs -  Physician Assistants and Nurse Practitioners) who all work together to provide you with the care you need, when you need it.  We recommend signing up for the patient portal called "MyChart".  Sign up information is provided on this After Visit Summary.  MyChart is used to connect with patients for Virtual Visits (Telemedicine).  Patients are able to view lab/test results, encounter notes, upcoming appointments, etc.  Non-urgent messages can be sent to your provider as well.   To learn more about what you can do with MyChart, go to [email or web address].    Your next appointment:   1 month(s)  Provider:   Coletta Memos, FNP, Fabian Sharp, PA-C, or Almyra Deforest, PA-C

## 2023-01-02 ENCOUNTER — Ambulatory Visit: Payer: Medicaid Other | Admitting: Cardiology

## 2023-01-02 ENCOUNTER — Telehealth: Payer: Self-pay | Admitting: Cardiology

## 2023-01-02 DIAGNOSIS — R55 Syncope and collapse: Secondary | ICD-10-CM

## 2023-01-02 DIAGNOSIS — R42 Dizziness and giddiness: Secondary | ICD-10-CM

## 2023-01-02 NOTE — Telephone Encounter (Signed)
Asking that our office provide her with a referral to see neurologist. Please advise

## 2023-01-02 NOTE — Telephone Encounter (Signed)
Spoke with pt regarding wanting a neurologist referral. Pt would like whomever Dr. Percival Spanish recommends. Will route to provider to advise. Pt verbalizes understanding.

## 2023-01-03 NOTE — Telephone Encounter (Signed)
Left message for pt, referral placed for dr Wells Guiles tat in neurology

## 2023-01-04 ENCOUNTER — Encounter: Payer: Self-pay | Admitting: Neurology

## 2023-01-04 NOTE — Addendum Note (Signed)
Encounter addended by: Markus Daft A on: 01/04/2023 12:54 PM  Actions taken: Imaging Exam ended

## 2023-01-06 ENCOUNTER — Other Ambulatory Visit: Payer: Medicaid Other

## 2023-01-06 ENCOUNTER — Encounter: Payer: Self-pay | Admitting: Neurology

## 2023-01-06 ENCOUNTER — Ambulatory Visit (INDEPENDENT_AMBULATORY_CARE_PROVIDER_SITE_OTHER): Payer: Medicaid Other | Admitting: Neurology

## 2023-01-06 VITALS — BP 129/83 | HR 103 | Ht 68.0 in | Wt 154.6 lb

## 2023-01-06 DIAGNOSIS — R55 Syncope and collapse: Secondary | ICD-10-CM

## 2023-01-06 NOTE — Patient Instructions (Addendum)
Good to meet you.  Schedule MRI brain with and without contrast  2. Schedule 1-hour EEG  3. Our office will call with results. If normal, follow-up as needed and continue Cardiology follow-up
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
## 2023-01-10 ENCOUNTER — Ambulatory Visit: Payer: Medicaid Other | Admitting: Neurology

## 2023-01-10 DIAGNOSIS — R55 Syncope and collapse: Secondary | ICD-10-CM | POA: Diagnosis not present

## 2023-01-10 NOTE — Progress Notes (Signed)
EEG complete - results pending 

## 2023-01-24 ENCOUNTER — Telehealth: Payer: Self-pay | Admitting: Neurology

## 2023-01-24 NOTE — Procedures (Signed)
ELECTROENCEPHALOGRAM REPORT  Date of Study: 01/10/2023  Patient's Name: Debbie Griffin MRN: [id number] Date of Birth: 10/05/1981  Referring Provider: Dr. Ellouise Newer  Clinical History: This is a 42 year old woman with dizziness and loss of consciousness. EEG for classification.  Medications: Trazodone  Technical Summary: A multichannel digital 1-hour EEG recording measured by the international 10-20 system with electrodes applied with paste and impedances below 5000 ohms performed in our laboratory with EKG monitoring in an awake and drowsy patient.  Hyperventilation and photic stimulation were performed.  The digital EEG was referentially recorded, reformatted, and digitally filtered in a variety of bipolar and referential montages for optimal display.    Description: The patient is awake and drowsy during the recording.  During maximal wakefulness, there is a symmetric, medium voltage 10 Hz posterior dominant rhythm that attenuates with eye opening.  The record is symmetric.  During drowsiness, there is an increase in theta slowing of the background.  Sleep was not captured. Hyperventilation and photic stimulation did not elicit any abnormalities.  There were no epileptiform discharges or electrographic seizures seen.    EKG lead was unremarkable.  Impression: This 1-hour awake and drowsy EEG is normal.    Clinical Correlation: A normal EEG does not exclude a clinical diagnosis of epilepsy.  If further clinical questions remain, prolonged EEG may be helpful.  Clinical correlation is advised.   Ellouise Newer, M.D.

## 2023-01-24 NOTE — Telephone Encounter (Signed)
Called patient and gave results and let her know no driving for 6 months

## 2023-01-24 NOTE — Telephone Encounter (Signed)
Pls let her know the EEG is normal. Proceed with brain MRI as scheduled. I see her cardiologist saying no driving for 6 months.

## 2023-01-24 NOTE — Telephone Encounter (Signed)
Pt called in wanting to get her EEG results and she also had a couple of follow up questions about when she can drive.

## 2023-01-26 ENCOUNTER — Ambulatory Visit: Payer: Medicaid Other | Admitting: Neurology
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
## 2023-02-01 ENCOUNTER — Encounter: Payer: Self-pay | Admitting: General Practice

## 2023-02-01 ENCOUNTER — Ambulatory Visit: Payer: Medicaid Other | Attending: General Practice | Admitting: General Practice

## 2023-02-01 VITALS — BP 96/50 | HR 70 | Ht 68.0 in | Wt 156.4 lb

## 2023-02-01 DIAGNOSIS — F419 Anxiety disorder, unspecified: Secondary | ICD-10-CM | POA: Insufficient documentation

## 2023-02-01 DIAGNOSIS — R42 Dizziness and giddiness: Secondary | ICD-10-CM | POA: Diagnosis not present

## 2023-02-01 DIAGNOSIS — R55 Syncope and collapse: Secondary | ICD-10-CM | POA: Insufficient documentation

## 2023-02-01 NOTE — Patient Instructions (Signed)
Medication Instructions:  The current medical regimen is effective;  continue present plan and medications as directed. Please refer to the Current Medication list given to you today.  *If you need a refill on your cardiac medications before your next appointment, please call your pharmacy*  Lab Work: NONE If you have labs (blood work) drawn today and your tests are completely normal, you will receive your results only by: [place] (if you have MyChart) OR  A paper copy in the mail If you have any lab test that is abnormal or we need to change your treatment, we will call you to review the results.  Other Instructions INCREASE PHYSICAL ACTIVITY-AS TOLERATED  LIBERALIZE SALT  INCREASE HYDRATION  MAY WEAR COMPRESSION STOCKINGS-AS NEEDED   Follow-Up: At [facility], you and your health needs are our priority.  As part of our continuing mission to provide you with exceptional heart care, we have created designated Provider Care Teams.  These Care Teams include your primary Cardiologist (physician) and Advanced Practice Providers (APPs -  Physician Assistants and Nurse Practitioners) who all work together to provide you with the care you need, when you need it.  Your next appointment:   AS NEEDED  Provider:   Minus Breeding, MD  or Coletta Memos, FNP

## 2023-02-02 ENCOUNTER — Ambulatory Visit
Admission: RE | Admit: 2023-02-02 | Discharge: 2023-02-02 | Disposition: A | Discharge status: Home / Self Care | Payer: Medicaid Other | Source: Ambulatory Visit | Attending: Neurology | Admitting: Neurology

## 2023-02-02 DIAGNOSIS — R55 Syncope and collapse: Secondary | ICD-10-CM

## 2023-02-02 MED ORDER — GADOPICLENOL 0.5 MMOL/ML IV SOLN
7.0000 mL | Freq: Once | INTRAVENOUS | Status: AC | PRN
  Administered 2023-02-02: 7 mL via INTRAVENOUS

## 2023-02-09 ENCOUNTER — Telehealth: Payer: Self-pay

## 2023-02-09 NOTE — Telephone Encounter (Signed)
Patient LVM asking if someone would call her about the MRI results.

## 2023-02-10 ENCOUNTER — Telehealth: Payer: Self-pay | Admitting: Neurology

## 2023-02-10 NOTE — Telephone Encounter (Signed)
Pt called in wanting to get her MRI results. She is eager to find out due to her cardiologist telling her if there was nothing on her MRI then she could start driving again.

## 2023-02-10 NOTE — Telephone Encounter (Signed)
Pls let her know brain MRI looks fine, no tumor, stroke, bleed. It shows very minimal nonspecific changes that would not cause any symptoms. Thanks

## 2023-02-10 NOTE — Telephone Encounter (Signed)
Pt called informed brain MRI looks fine, no tumor, stroke, bleed. It shows very minimal nonspecific changes that would not cause any symptoms

## 2023-02-10 NOTE — Telephone Encounter (Signed)
See other phone note

## 2023-02-22 ENCOUNTER — Telehealth: Payer: Self-pay

## 2023-02-22 NOTE — Telephone Encounter (Signed)
Patient is needing a letter submitted through [place] [employer] stating she is cleared to drive. Needing this for work purposes.

## 2023-02-26 ENCOUNTER — Emergency Department (HOSPITAL_BASED_OUTPATIENT_CLINIC_OR_DEPARTMENT_OTHER)
Admission: EM | Admit: 2023-02-26 | Discharge: 2023-02-26 | Disposition: A | Discharge status: Home / Self Care | Payer: Medicaid Other | Attending: Emergency Medicine | Admitting: Emergency Medicine

## 2023-02-26 ENCOUNTER — Encounter (HOSPITAL_BASED_OUTPATIENT_CLINIC_OR_DEPARTMENT_OTHER): Payer: Self-pay

## 2023-02-26 ENCOUNTER — Other Ambulatory Visit: Payer: Self-pay

## 2023-02-26 ENCOUNTER — Emergency Department (HOSPITAL_BASED_OUTPATIENT_CLINIC_OR_DEPARTMENT_OTHER): Payer: Medicaid Other

## 2023-02-26 DIAGNOSIS — R197 Diarrhea, unspecified: Secondary | ICD-10-CM | POA: Insufficient documentation

## 2023-02-26 DIAGNOSIS — N3000 Acute cystitis without hematuria: Secondary | ICD-10-CM | POA: Diagnosis not present

## 2023-02-26 DIAGNOSIS — R634 Abnormal weight loss: Secondary | ICD-10-CM | POA: Insufficient documentation

## 2023-02-26 DIAGNOSIS — R1110 Vomiting, unspecified: Secondary | ICD-10-CM | POA: Diagnosis present

## 2023-02-26 DIAGNOSIS — E876 Hypokalemia: Secondary | ICD-10-CM | POA: Insufficient documentation

## 2023-02-26 LAB — COMPREHENSIVE METABOLIC PANEL
ALT: 16 U/L (ref 0–44)
AST: 20 U/L (ref 15–41)
Albumin: 4.4 g/dL (ref 3.5–5.0)
Alkaline Phosphatase: 57 U/L (ref 38–126)
Anion gap: 11 (ref 5–15)
BUN: 17 mg/dL (ref 6–20)
CO2: 38 mmol/L — ABNORMAL HIGH (ref 22–32)
Calcium: 9.8 mg/dL (ref 8.9–10.3)
Chloride: 81 mmol/L — ABNORMAL LOW (ref 98–111)
Creatinine, Ser: 1.14 mg/dL — ABNORMAL HIGH (ref 0.44–1.00)
GFR, Estimated: >60 mL/min (ref >60)
Glucose, Bld: 111 mg/dL — ABNORMAL HIGH (ref 70–99)
Potassium: 3.3 mmol/L — ABNORMAL LOW (ref 3.5–5.1)
Sodium: 130 mmol/L — ABNORMAL LOW (ref 135–145)
Total Bilirubin: 1.1 mg/dL (ref 0.3–1.2)
Total Protein: 7.2 g/dL (ref 6.5–8.1)

## 2023-02-26 LAB — URINALYSIS, ROUTINE W REFLEX MICROSCOPIC
Bilirubin Urine: NEGATIVE
Glucose, UA: NEGATIVE mg/dL
Hgb urine dipstick: NEGATIVE
Ketones, ur: NEGATIVE mg/dL
Nitrite: NEGATIVE
Specific Gravity, Urine: 1.029 (ref 1.005–1.030)
Squamous Epithelial / HPF: >50 /HPF (ref 0–5)
pH: 6.5 (ref 5.0–8.0)

## 2023-02-26 LAB — LIPASE, BLOOD: Lipase: 11 U/L (ref 11–51)

## 2023-02-26 LAB — CBC WITH DIFFERENTIAL/PLATELET
Abs Immature Granulocytes: 0.02 10*3/uL (ref 0.00–0.07)
Basophils Absolute: 0 10*3/uL (ref 0.0–0.1)
Basophils Relative: 0 %
Eosinophils Absolute: 0.5 10*3/uL (ref 0.0–0.5)
Eosinophils Relative: 6 %
HCT: 46.2 % — ABNORMAL HIGH (ref 36.0–46.0)
Hemoglobin: 16.2 g/dL — ABNORMAL HIGH (ref 12.0–15.0)
Immature Granulocytes: 0 %
Lymphocytes Relative: 21 %
Lymphs Abs: 1.5 10*3/uL (ref 0.7–4.0)
MCH: 30.1 pg (ref 26.0–34.0)
MCHC: 35.1 g/dL (ref 30.0–36.0)
MCV: 85.9 fL (ref 80.0–100.0)
Monocytes Absolute: 0.7 10*3/uL (ref 0.1–1.0)
Monocytes Relative: 10 %
Neutro Abs: 4.6 10*3/uL (ref 1.7–7.7)
Neutrophils Relative %: 63 %
Platelets: 297 10*3/uL (ref 150–400)
RBC: 5.38 MIL/uL — ABNORMAL HIGH (ref 3.87–5.11)
RDW: 11.8 % (ref 11.5–15.5)
WBC: 7.3 10*3/uL (ref 4.0–10.5)
nRBC: 0 % (ref 0.0–0.2)

## 2023-02-26 LAB — BASIC METABOLIC PANEL
Anion gap: 7 (ref 5–15)
BUN: 15 mg/dL (ref 6–20)
CO2: 40 mmol/L — ABNORMAL HIGH (ref 22–32)
Calcium: 8.9 mg/dL (ref 8.9–10.3)
Chloride: 87 mmol/L — ABNORMAL LOW (ref 98–111)
Creatinine, Ser: 0.97 mg/dL (ref 0.44–1.00)
GFR, Estimated: >60 mL/min (ref >60)
Glucose, Bld: 115 mg/dL — ABNORMAL HIGH (ref 70–99)
Potassium: 2.7 mmol/L — CL (ref 3.5–5.1)
Sodium: 134 mmol/L — ABNORMAL LOW (ref 135–145)

## 2023-02-26 LAB — HIV ANTIBODY (ROUTINE TESTING W REFLEX): HIV Screen 4th Generation wRfx: NONREACTIVE

## 2023-02-26 LAB — PREGNANCY, URINE: Preg Test, Ur: NEGATIVE

## 2023-02-26 LAB — CBG MONITORING, ED: Glucose-Capillary: 110 mg/dL — ABNORMAL HIGH (ref 70–99)

## 2023-02-26 LAB — TSH: TSH: 1.192 u[IU]/mL (ref 0.350–4.500)

## 2023-02-26 MED ORDER — SODIUM CHLORIDE 0.9 % IV BOLUS
1000.0000 mL | Freq: Once | INTRAVENOUS | Status: AC
  Administered 2023-02-26: 1000 mL via INTRAVENOUS

## 2023-02-26 MED ORDER — METOCLOPRAMIDE HCL 10 MG PO TABS: 10.0000 mg | ORAL_TABLET | Freq: Four times a day (QID) | ORAL | 0 refills | Status: DC

## 2023-02-26 MED ORDER — CEPHALEXIN 500 MG PO CAPS: 500.0000 mg | ORAL_CAPSULE | Freq: Four times a day (QID) | ORAL | 0 refills | Status: DC

## 2023-02-26 MED ORDER — SODIUM CHLORIDE 0.9 % IV SOLN: Freq: Once | INTRAVENOUS | Status: AC

## 2023-02-26 MED ORDER — POTASSIUM CHLORIDE 10 MEQ/100ML IV SOLN
10.0000 meq | INTRAVENOUS | Status: AC
  Administered 2023-02-26 (×2): 10 meq via INTRAVENOUS
  Filled 2023-02-26: qty 100

## 2023-02-26 MED ORDER — IOHEXOL 300 MG/ML  SOLN
100.0000 mL | Freq: Once | INTRAMUSCULAR | Status: AC | PRN
  Administered 2023-02-26: 80 mL via INTRAVENOUS

## 2023-02-26 MED ORDER — LACTATED RINGERS IV BOLUS
1000.0000 mL | Freq: Once | INTRAVENOUS | Status: AC
  Administered 2023-02-26: 1000 mL via INTRAVENOUS

## 2023-02-26 NOTE — ED Notes (Signed)
Patient ambulatory to restroom with steady gait.

## 2023-02-26 NOTE — ED Triage Notes (Addendum)
Patient reports significant weight loss over the previous two weeks d/t D. Reports she weighed 70kgs two weeks ago to 62kgs today. Has been evaluated at PCP. Denies loss of appetite, N/V, abd pain, fever, chills.

## 2023-02-26 NOTE — ED Notes (Signed)
CRITICAL VALUE STICKER  CRITICAL VALUE:  RECEIVER (on-site recipient of call):Alistar Mcenery,rn  DATE & TIME NOTIFIED: 02/26/2023 1621  MESSENGER (representative from lab):  MD NOTIFIED: Theodis Blaze , PA  TIME OF NOTIFICATION: [id number] RESPONSE:

## 2023-02-26 NOTE — Discharge Instructions (Addendum)
You are seen in the emergency department today for vomiting and diarrhea.  I am referring you to a gastroenterologist to have ongoing workup of your symptoms.  In the meantime I have prescribed you Reglan for vomiting.  He can continue to use Lomotil for your diarrhea.  Please return to the emergency department if you are unable to tolerate liquids due to vomiting.

## 2023-02-26 NOTE — ED Provider Notes (Signed)
[facility] Provider Note   CSN: [id number] Arrival date & time: 02/26/23  1205     History  Chief Complaint  Patient presents with   Weight Loss    Debbie Griffin is a 42 y.o. female. With past medical history of opiate abuse, adjustment disorder, anxiety who presents to the emergency department for weight loss.   States she has had diarrhea and vomiting for three weeks. She feels dehydrated. She states she has been seen multiple times for these symptoms and has been now on three different medications. She was initially started on Imodium, but states this did not improve symptoms. She was given Zofran for her vomiting which she states did not help. She is now on Lomotil which she states has stopped her diarrhea but is now having increase in vomiting. She describes having watery, non-bloody, "explosive" diarrhea. She states that she would have diarrhea and vomiting shortly after eating. However, states she has been eating and drinking normally. She last went to PCP on Thursday. This is who prescribed Lomotil. Diarrhea improving but she now has about 4-5 episodes of non-bloody emesis daily. Notes that she had her weight taken at primary care and had lost 15 lbs. She denies having abdominal pain, fever, dysuria, vaginal discharge. Denies recent travel or previous abdominal surgeries or recent antibiotics.    HPI     Home Medications Prior to Admission medications   Medication Sig Start Date End Date Taking? Authorizing Provider  cephALEXin (KEFLEX) 500 MG capsule Take 1 capsule (500 mg total) by mouth 4 (four) times daily. 02/26/23  Yes Mickie Hillier, PA-C  metoCLOPramide (REGLAN) 10 MG tablet Take 1 tablet (10 mg total) by mouth every 6 (six) hours. 02/26/23  Yes Mickie Hillier, PA-C  trazodone (DESYREL) 300 MG tablet Take 150 mg by mouth at bedtime.    [provider]      Allergies    Patient has no known allergies.    Review  of Systems   Review of Systems  Gastrointestinal:  Positive for diarrhea, nausea and vomiting. Negative for blood in stool.  All other systems reviewed and are negative.   Physical Exam Updated Vital Signs BP 95/62 (BP Location: Right Arm)   Pulse 68   Temp 98.8 F (37.1 C) (Oral)   Resp 16   Wt 62.1 kg Comment: Simultaneous filing. User may not have seen previous data.  LMP  (LMP Unknown)   SpO2 94%   BMI 20.83 kg/m  Physical Exam Vitals and nursing note reviewed.  Constitutional:      General: She is not in acute distress.    Appearance: Normal appearance. She is normal weight. She is not ill-appearing or toxic-appearing.  HENT:     Head: Normocephalic.     Mouth/Throat:     Mouth: Mucous membranes are dry.     Pharynx: Oropharynx is clear.  Eyes:     General: No scleral icterus.    Extraocular Movements: Extraocular movements intact.     Pupils: Pupils are equal, round, and reactive to light.  Cardiovascular:     Rate and Rhythm: Normal rate and regular rhythm.     Pulses: Normal pulses.     Heart sounds: No murmur heard. Pulmonary:     Effort: Pulmonary effort is normal. No respiratory distress.     Breath sounds: Normal breath sounds.  Abdominal:     General: Bowel sounds are normal. There is no distension.  Palpations: Abdomen is soft.     Tenderness: There is no abdominal tenderness.  Musculoskeletal:        General: Normal range of motion.  Skin:    General: Skin is warm and dry.     Capillary Refill: Capillary refill takes less than 2 seconds.  Neurological:     General: No focal deficit present.     Mental Status: She is alert and oriented to person, place, and time. Mental status is at baseline.  Psychiatric:        Mood and Affect: Mood normal.        Behavior: Behavior normal.        Thought Content: Thought content normal.        Judgment: Judgment normal.     ED Results / Procedures / Treatments   Labs (all labs ordered are listed, but  only abnormal results are displayed) Labs Reviewed  COMPREHENSIVE METABOLIC PANEL - Abnormal; Notable for the following components:      Result Value   Sodium 130 (*)    Potassium 3.3 (*)    Chloride 81 (*)    CO2 38 (*)    Glucose, Bld 111 (*)    Creatinine, Ser 1.14 (*)    All other components within normal limits  CBC WITH DIFFERENTIAL/PLATELET - Abnormal; Notable for the following components:   RBC 5.38 (*)    Hemoglobin 16.2 (*)    HCT 46.2 (*)    All other components within normal limits  URINALYSIS, ROUTINE W REFLEX MICROSCOPIC - Abnormal; Notable for the following components:   APPearance CLOUDY (*)    Protein, ur TRACE (*)    Leukocytes,Ua MODERATE (*)    Bacteria, UA MANY (*)    All other components within normal limits  BASIC METABOLIC PANEL - Abnormal; Notable for the following components:   Sodium 134 (*)    Potassium 2.7 (*)    Chloride 87 (*)    CO2 40 (*)    Glucose, Bld 115 (*)    All other components within normal limits  CBG MONITORING, ED - Abnormal; Notable for the following components:   Glucose-Capillary 110 (*)    All other components within normal limits  URINE CULTURE  LIPASE, BLOOD  TSH  PREGNANCY, URINE  HIV ANTIBODY (ROUTINE TESTING W REFLEX)    EKG None  Radiology CT Abdomen Pelvis W Contrast  Result Date: 02/26/2023 CLINICAL DATA:  Abdominal pain, acute, nonlocalized EXAM: CT ABDOMEN AND PELVIS WITH CONTRAST TECHNIQUE: Multidetector CT imaging of the abdomen and pelvis was performed using the standard protocol following bolus administration of intravenous contrast. RADIATION DOSE REDUCTION: This exam was performed according to the departmental dose-optimization program which includes automated exposure control, adjustment of the mA and/or kV according to patient size and/or use of iterative reconstruction technique. CONTRAST:  3mL OMNIPAQUE IOHEXOL 300 MG/ML  SOLN COMPARISON:  MRI 05/22/2020 FINDINGS: Lower chest: No acute abnormality.  Hepatobiliary: No focal liver abnormality is seen. No gallstones, gallbladder wall thickening, or biliary dilatation. Pancreas: Grossly unremarkable. Spleen: Normal in size without focal abnormality. Adrenals/Urinary Tract: Unremarkable adrenal glands. Kidneys enhance symmetrically without focal lesion, stone, or hydronephrosis. Ureters are nondilated. Urinary bladder appears unremarkable for the degree of distention. Stomach/Bowel: Stomach is moderately distended with ingested material. Multiple dilated, fluid-filled loops of small bowel throughout the abdomen measuring up to 4.3 cm in diameter. The distal small bowel is decompressed, however an abrupt transition point is not definitively seen. There is gaseous distension of the  transverse colon with a few air-fluid levels. A normal appendix is seen in the right lower quadrant (series 2, image 53). Vascular/Lymphatic: No significant vascular findings are present. No enlarged abdominal or pelvic lymph nodes. Reproductive: Uterus and bilateral adnexa are unremarkable. Other: No abdominal wall hernia or abnormality. No abdominopelvic ascites. Musculoskeletal: No acute or significant osseous findings. IMPRESSION: 1. Multiple dilated, fluid-filled loops of small bowel throughout the abdomen measuring up to 4.3 cm in diameter. The distal small bowel is decompressed, however an abrupt transition point is not definitively seen. Findings are concerning for a partial or developing small bowel obstruction. 2. Gaseous distension of the transverse colon with a few air-fluid levels. 3. Normal appendix. Electronically Signed   By: Davina Poke D.O.   On: 02/26/2023 17:42    Procedures Procedures   Medications Ordered in ED Medications  potassium chloride 10 mEq in 100 mL IVPB (10 mEq Intravenous New Bag/Given 02/26/23 1811)  lactated ringers bolus 1,000 mL (0 mLs Intravenous Stopped 02/26/23 1358)  sodium chloride 0.9 % bolus 1,000 mL (0 mLs Intravenous Stopped 02/26/23  1510)  0.9 %  sodium chloride infusion ( Intravenous New Bag/Given 02/26/23 1710)  iohexol (OMNIPAQUE) 300 MG/ML solution 100 mL (80 mLs Intravenous Contrast Given 02/26/23 1654)    ED Course/ Medical Decision Making/ A&P Clinical Course as of 02/26/23 1856  Sun Feb 26, 2023  1336 Comprehensive metabolic panel(!) [LA]  [id number] Spoke with Dr. Ninfa Linden with general surgery who does not think CT findings are of true bowel obstruction. Feels she needs likely GI follow-up for her symptoms.  [LA]    Clinical Course User Index [LA] Mickie Hillier, PA-C   {    Medical Decision Making Amount and/or Complexity of Data Reviewed Labs: ordered. Decision-making details documented in ED Course. Radiology: ordered.  Risk Prescription drug management.  Initial Impression and Ddx 42 year old female who presents to the emergency department with vomiting and diarrhea Patient PMH that increases complexity of ED encounter:  opiate abuse, adjustment disorder  Differential: viral gastroenteritis, other infectious gastroenteritis, adrenal crisis, thyroid storm, antibiotic associated, bowel obstruction, hyperemesis, hepatobiliary disease, cannabinoid   Interpretation of Diagnostics I independent reviewed and interpreted the labs as followed: Initial CMP with sodium of 130, potassium of 3.3, chloride of 81, mild increased creatinine to 1.14.  Appears to possibly have UTI.  No leukocytosis, TSH is normal.  Lipase is normal.  Repeated BMP with potassium of 2.7 but improving chloride and renal function.  - I independently visualized the following imaging with scope of interpretation limited to determining acute life threatening conditions related to emergency care: CT abdomen pelvis, which revealed IMPRESSION: 1. Multiple dilated, fluid-filled loops of small bowel throughout the abdomen measuring up to 4.3 cm in diameter. The distal small bowel is decompressed, however an abrupt transition point is not definitively  seen. Findings are concerning for a partial or developing small bowel obstruction. 2. Gaseous distension of the transverse colon with a few air-fluid levels. 3. Normal appendix. Electronically Signed   By: Davina Poke D.O.   On: 02/26/2023 17:42    Patient Reassessment and Ultimate Disposition/Management 42 year old female who presents with 3 weeks of N/V/D. States she has lost about 15 lbs. Has been seen a few times for the same complaint.  Overall, non-septic and non-toxic appearing. Hemodynamically stable. Afebrile. Her lips are somewhat dry. She is thin but non cachectic or frail appearing.  Will get basic labs and TSH, HIV and give her IVF and reassess.  Non-bloody so doubt hemorrhage ecoli.  1330: CMP with what appears to be significant dehydration. Chloride is 81. Na 130. Mild increased creatinine. CBC is hemoconcentrated. I will give 2nd liter IVF and recheck BMP afterward.  1640: Second bolus finished and rechecked BMP with worsening hypokalemia.  Sodium is 127.  Her chloride is slightly improved to 87.  She does have contraction alkalosis.  I discussed with Dr. Billy Fischer about neck steps.  We will move forward with CT abdomen pelvis to ensure no acute findings.  Will also give her some running IV fluids as well as potassium replacement.  CT shows IMPRESSION: 1. Multiple dilated, fluid-filled loops of small bowel throughout the abdomen measuring up to 4.3 cm in diameter. The distal small bowel is decompressed, however an abrupt transition point is not definitively seen. Findings are concerning for a partial or developing small bowel obstruction. 2. Gaseous distension of the transverse colon with a few air-fluid levels. 3. Normal appendix. Electronically Signed   By: Davina Poke D.O.   On: 02/26/2023 17:42    I consulted and spoke with Dr. Ninfa Linden, general surgery who does not feel that this is an acute bowel obstruction.  May be related to opioid use.  Patient is on Suboxone.  He  recommends that she have follow-up with a gastroenterologist.  Patient had potassium replaced IV.  I will prescribe her Keflex for her UTI. Culture was sent.  I am prescribing her Reglan given that Zofran is not working and I have sent a ambulatory referral to gastroenterology.  I have given her strict return precautions for worsening symptoms.  However overall she is clinically well-appearing, nonseptic and nontoxic in appearance.  Feel that she is appropriate for discharge at this time.  Patient management required discussion with the following services or consulting groups:  General/Trauma Surgery  Complexity of Problems Addressed Acute complicated illness or Injury  Additional Data Reviewed and Analyzed Further history obtained from: Past medical history and medications listed in the EMR, Prior ED visit notes, Care Everywhere, and Prior labs/imaging results  Patient Encounter Risk Assessment Prescriptions, SDOH impact on management, and Consideration of hospitalization  Final Clinical Impression(s) / ED Diagnoses Final diagnoses:  Vomiting and diarrhea  Acute cystitis without hematuria    Rx / DC Orders ED Discharge Orders          Ordered    metoCLOPramide (REGLAN) 10 MG tablet  Every 6 hours        02/26/23 1841    Ambulatory referral to Gastroenterology        02/26/23 1841    cephALEXin (KEFLEX) 500 MG capsule  4 times daily        02/26/23 1856              Mickie Hillier, PA-C 02/26/23 1857    Ezequiel Essex, MD 02/27/23 0730

## 2023-02-27 ENCOUNTER — Encounter: Payer: Self-pay | Admitting: Neurology

## 2023-02-27 ENCOUNTER — Encounter: Payer: Self-pay | Admitting: Physician Assistant

## 2023-02-27 LAB — URINE CULTURE

## 2023-02-27 NOTE — Telephone Encounter (Signed)
Pt called informed that letter I ready for pick up

## 2023-02-27 NOTE — Telephone Encounter (Signed)
Done, thanks

## 2023-04-11 ENCOUNTER — Ambulatory Visit: Payer: Medicaid Other | Admitting: Physician Assistant

## 2023-04-12 ENCOUNTER — Ambulatory Visit: Payer: Medicaid Other | Admitting: Gastroenterology

## 2023-09-26 ENCOUNTER — Emergency Department (HOSPITAL_COMMUNITY): Payer: MEDICAID

## 2023-09-26 ENCOUNTER — Emergency Department (HOSPITAL_COMMUNITY)
Admission: EM | Admit: 2023-09-26 | Discharge: 2023-09-27 | Disposition: A | Discharge status: Home / Self Care | Payer: MEDICAID | Attending: Emergency Medicine | Admitting: Emergency Medicine

## 2023-09-26 ENCOUNTER — Other Ambulatory Visit: Payer: Self-pay

## 2023-09-26 ENCOUNTER — Encounter (HOSPITAL_COMMUNITY): Payer: Self-pay

## 2023-09-26 DIAGNOSIS — M25512 Pain in left shoulder: Secondary | ICD-10-CM | POA: Diagnosis not present

## 2023-09-26 DIAGNOSIS — S60011A Contusion of right thumb without damage to nail, initial encounter: Secondary | ICD-10-CM | POA: Insufficient documentation

## 2023-09-26 DIAGNOSIS — Y92410 Unspecified street and highway as the place of occurrence of the external cause: Secondary | ICD-10-CM | POA: Diagnosis not present

## 2023-09-26 DIAGNOSIS — Z87891 Personal history of nicotine dependence: Secondary | ICD-10-CM | POA: Insufficient documentation

## 2023-09-26 DIAGNOSIS — M79644 Pain in right finger(s): Secondary | ICD-10-CM | POA: Diagnosis present

## 2023-09-26 LAB — CBC WITH DIFFERENTIAL/PLATELET
Abs Immature Granulocytes: 0.02 10*3/uL (ref 0.00–0.07)
Basophils Absolute: 0 10*3/uL (ref 0.0–0.1)
Basophils Relative: 0 %
Eosinophils Absolute: 0.2 10*3/uL (ref 0.0–0.5)
Eosinophils Relative: 3 %
HCT: 37.1 % (ref 36.0–46.0)
Hemoglobin: 12.5 g/dL (ref 12.0–15.0)
Immature Granulocytes: 0 %
Lymphocytes Relative: 30 %
Lymphs Abs: 1.7 10*3/uL (ref 0.7–4.0)
MCH: 28.9 pg (ref 26.0–34.0)
MCHC: 33.7 g/dL (ref 30.0–36.0)
MCV: 85.7 fL (ref 80.0–100.0)
Monocytes Absolute: 0.5 10*3/uL (ref 0.1–1.0)
Monocytes Relative: 9 %
Neutro Abs: 3.4 10*3/uL (ref 1.7–7.7)
Neutrophils Relative %: 58 %
Platelets: 191 10*3/uL (ref 150–400)
RBC: 4.33 MIL/uL (ref 3.87–5.11)
RDW: 11.9 % (ref 11.5–15.5)
WBC: 5.9 10*3/uL (ref 4.0–10.5)
nRBC: 0 % (ref 0.0–0.2)

## 2023-09-26 LAB — COMPREHENSIVE METABOLIC PANEL
ALT: 19 U/L (ref 0–44)
AST: 24 U/L (ref 15–41)
Albumin: 4 g/dL (ref 3.5–5.0)
Alkaline Phosphatase: 55 U/L (ref 38–126)
Anion gap: 9 (ref 5–15)
BUN: 12 mg/dL (ref 6–20)
CO2: 28 mmol/L (ref 22–32)
Calcium: 9.2 mg/dL (ref 8.9–10.3)
Chloride: 100 mmol/L (ref 98–111)
Creatinine, Ser: 0.92 mg/dL (ref 0.44–1.00)
GFR, Estimated: >60 mL/min (ref >60)
Glucose, Bld: 89 mg/dL (ref 70–99)
Potassium: 3.9 mmol/L (ref 3.5–5.1)
Sodium: 137 mmol/L (ref 135–145)
Total Bilirubin: 0.5 mg/dL (ref 0.3–1.2)
Total Protein: 6.6 g/dL (ref 6.5–8.1)

## 2023-09-26 LAB — TYPE AND SCREEN
ABO/RH(D): O NEG
Antibody Screen: NEGATIVE

## 2023-09-26 LAB — HCG, SERUM, QUALITATIVE: Preg, Serum: NEGATIVE

## 2023-09-26 MED ORDER — MORPHINE SULFATE (PF) 4 MG/ML IV SOLN
4.0000 mg | Freq: Once | INTRAVENOUS | Status: AC
  Administered 2023-09-26: 4 mg via INTRAVENOUS
  Filled 2023-09-26: qty 1

## 2023-09-26 MED ORDER — IOHEXOL 350 MG/ML SOLN
75.0000 mL | Freq: Once | INTRAVENOUS | Status: AC | PRN
  Administered 2023-09-27: 75 mL via INTRAVENOUS

## 2023-09-26 MED ORDER — ACETAMINOPHEN 500 MG PO TABS
1000.0000 mg | ORAL_TABLET | Freq: Once | ORAL | Status: AC
  Administered 2023-09-26: 1000 mg via ORAL
  Filled 2023-09-26: qty 2

## 2023-09-26 MED ORDER — ONDANSETRON HCL 4 MG/2ML IJ SOLN
4.0000 mg | Freq: Once | INTRAMUSCULAR | Status: AC
  Administered 2023-09-26: 4 mg via INTRAVENOUS
  Filled 2023-09-26: qty 2

## 2023-09-26 MED ORDER — OXYCODONE HCL 5 MG PO TABS
5.0000 mg | ORAL_TABLET | Freq: Once | ORAL | Status: AC
  Administered 2023-09-27: 5 mg via ORAL
  Filled 2023-09-26: qty 1
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
## 2023-09-27 NOTE — ED Notes (Signed)
Pt ambulatory to restroom. Gait steady

## 2023-10-02 ENCOUNTER — Emergency Department (HOSPITAL_COMMUNITY): Payer: MEDICAID

## 2023-10-02 ENCOUNTER — Encounter (HOSPITAL_COMMUNITY): Payer: Self-pay | Admitting: Emergency Medicine

## 2023-10-02 ENCOUNTER — Emergency Department (HOSPITAL_COMMUNITY)
Admission: EM | Admit: 2023-10-02 | Discharge: 2023-10-03 | Disposition: A | Discharge status: Home / Self Care | Payer: MEDICAID | Attending: Emergency Medicine | Admitting: Emergency Medicine

## 2023-10-02 DIAGNOSIS — M549 Dorsalgia, unspecified: Secondary | ICD-10-CM | POA: Diagnosis not present

## 2023-10-02 DIAGNOSIS — M542 Cervicalgia: Secondary | ICD-10-CM | POA: Insufficient documentation

## 2023-10-02 DIAGNOSIS — R519 Headache, unspecified: Secondary | ICD-10-CM | POA: Diagnosis present

## 2023-10-02 DIAGNOSIS — R52 Pain, unspecified: Secondary | ICD-10-CM

## 2023-10-02 LAB — I-STAT CHEM 8, ED
BUN: 11 mg/dL (ref 6–20)
Calcium, Ion: 1.13 mmol/L — ABNORMAL LOW (ref 1.15–1.40)
Chloride: 101 mmol/L (ref 98–111)
Creatinine, Ser: 0.9 mg/dL (ref 0.44–1.00)
Glucose, Bld: 99 mg/dL (ref 70–99)
HCT: 39 % (ref 36.0–46.0)
Hemoglobin: 13.3 g/dL (ref 12.0–15.0)
Potassium: 4.4 mmol/L (ref 3.5–5.1)
Sodium: 138 mmol/L (ref 135–145)
TCO2: 25 mmol/L (ref 22–32)

## 2023-10-02 MED ORDER — IOHEXOL 350 MG/ML SOLN
75.0000 mL | Freq: Once | INTRAVENOUS | Status: AC | PRN
  Administered 2023-10-02: 75 mL via INTRAVENOUS

## 2023-10-02 MED ORDER — PROCHLORPERAZINE EDISYLATE 10 MG/2ML IJ SOLN
10.0000 mg | Freq: Once | INTRAMUSCULAR | Status: AC
  Administered 2023-10-02: 10 mg via INTRAVENOUS
  Filled 2023-10-02: qty 2

## 2023-10-02 MED ORDER — DIPHENHYDRAMINE HCL 50 MG/ML IJ SOLN
25.0000 mg | Freq: Once | INTRAMUSCULAR | Status: AC
  Administered 2023-10-02: 25 mg via INTRAVENOUS
  Filled 2023-10-02: qty 1
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
## 2023-10-03 ENCOUNTER — Other Ambulatory Visit: Payer: Self-pay

## 2023-10-03 MED ORDER — METAXALONE 800 MG PO TABS: 800.0000 mg | ORAL_TABLET | Freq: Three times a day (TID) | ORAL | 0 refills | Status: DC

## 2023-11-06 ENCOUNTER — Ambulatory Visit (HOSPITAL_COMMUNITY): Admit: 2023-11-06 | Payer: No Typology Code available for payment source | Admitting: General Surgery

## 2023-11-06 SURGERY — REPAIR, HERNIA, VENTRAL, LAPAROSCOPIC
Anesthesia: General

## 2024-01-16 ENCOUNTER — Emergency Department (HOSPITAL_BASED_OUTPATIENT_CLINIC_OR_DEPARTMENT_OTHER): Payer: No Typology Code available for payment source

## 2024-01-16 ENCOUNTER — Emergency Department (HOSPITAL_BASED_OUTPATIENT_CLINIC_OR_DEPARTMENT_OTHER)
Admission: EM | Admit: 2024-01-16 | Discharge: 2024-01-16 | Disposition: A | Discharge status: Home / Self Care | Payer: No Typology Code available for payment source | Attending: Emergency Medicine | Admitting: Emergency Medicine

## 2024-01-16 ENCOUNTER — Emergency Department (HOSPITAL_BASED_OUTPATIENT_CLINIC_OR_DEPARTMENT_OTHER): Payer: No Typology Code available for payment source | Admitting: Radiology

## 2024-01-16 ENCOUNTER — Encounter (HOSPITAL_BASED_OUTPATIENT_CLINIC_OR_DEPARTMENT_OTHER): Payer: Self-pay | Admitting: Emergency Medicine

## 2024-01-16 DIAGNOSIS — R519 Headache, unspecified: Secondary | ICD-10-CM | POA: Diagnosis not present

## 2024-01-16 DIAGNOSIS — Y92410 Unspecified street and highway as the place of occurrence of the external cause: Secondary | ICD-10-CM | POA: Diagnosis not present

## 2024-01-16 DIAGNOSIS — M25551 Pain in right hip: Secondary | ICD-10-CM | POA: Diagnosis present

## 2024-01-16 DIAGNOSIS — M25512 Pain in left shoulder: Secondary | ICD-10-CM | POA: Insufficient documentation

## 2024-01-16 MED ORDER — HYDROMORPHONE HCL 1 MG/ML IJ SOLN
2.0000 mg | Freq: Once | INTRAMUSCULAR | Status: AC
  Administered 2024-01-16: 2 mg via INTRAMUSCULAR
  Filled 2024-01-16 (×2): qty 2

## 2024-01-16 MED ORDER — FENTANYL CITRATE PF 50 MCG/ML IJ SOSY
50.0000 ug | PREFILLED_SYRINGE | Freq: Once | INTRAMUSCULAR | Status: AC
  Administered 2024-01-16: 50 ug via INTRAVENOUS
  Filled 2024-01-16: qty 1

## 2024-01-16 MED ORDER — METHOCARBAMOL 500 MG PO TABS
1000.0000 mg | ORAL_TABLET | Freq: Once | ORAL | Status: AC
  Administered 2024-01-16: 1000 mg via ORAL
  Filled 2024-01-16: qty 2

## 2024-01-16 MED ORDER — HYDROMORPHONE HCL 1 MG/ML IJ SOLN
2.0000 mg | Freq: Once | INTRAMUSCULAR | Status: AC
  Administered 2024-01-16: 2 mg via INTRAVENOUS
  Filled 2024-01-16: qty 2

## 2024-01-16 MED ORDER — LIDOCAINE 5 % EX PTCH: 1.0000 | MEDICATED_PATCH | CUTANEOUS | 0 refills | Status: AC

## 2024-01-16 MED ORDER — KETOROLAC TROMETHAMINE 15 MG/ML IJ SOLN
30.0000 mg | Freq: Once | INTRAMUSCULAR | Status: AC
  Administered 2024-01-16: 30 mg via INTRAMUSCULAR
  Filled 2024-01-16: qty 2

## 2024-01-16 MED ORDER — METHOCARBAMOL 500 MG PO TABS: 500.0000 mg | ORAL_TABLET | Freq: Two times a day (BID) | ORAL | 0 refills | Status: DC

## 2024-01-16 NOTE — ED Notes (Addendum)
Calls out. Pacing in room appears to be in distress. Reports last medication did not help pain "at all". PA notified. See MAR- meds to follow.
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
## 2024-01-16 NOTE — ED Notes (Signed)
Patient able to bend to put shoes on and ambulate to restroom with minimal to no assistance.
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
## 2024-01-16 NOTE — ED Notes (Signed)
Reviewed AVS/discharge instruction with patient. Time allotted for and all questions answered. Patient is agreeable for d/c and escorted to ed exit by staff.
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
## 2024-01-16 NOTE — ED Notes (Signed)
Family member came to nurses station. Reports uncontrolled pain after pain medication.  Provider notified Asked him to eval patient
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
## 2024-01-18 NOTE — Progress Notes (Signed)
Sent message, via epic in basket, requesting orders in epic from Careers adviser.

## 2024-01-22 ENCOUNTER — Ambulatory Visit: Payer: Self-pay | Admitting: General Surgery

## 2024-01-22 DIAGNOSIS — K439 Ventral hernia without obstruction or gangrene: Secondary | ICD-10-CM

## 2024-01-22 NOTE — Progress Notes (Signed)
 COVID Vaccine received:  []  No []  Yes Date of any COVID positive Test in last 90 days:  PCP - [facility] Cardiologist - Rollene Rotunda MD  Chest x-ray - 01/16/24 Epic EKG -  09/27/23 Epic Stress Test -  ECHO - 12/09/22 Epic Cardiac Cath -   Bowel Prep - []  No  []   Yes ______  Pacemaker / ICD device []  No []  Yes   Spinal Cord Stimulator:[]  No []  Yes       History of Sleep Apnea? []  No []  Yes   CPAP used?- []  No []  Yes    Does the patient monitor blood sugar?          []  No []  Yes  []  N/A  Patient has: []  NO Hx DM   []  Pre-DM                 []  DM1  []   DM2 Does patient have a [employer] or Dexacom? []  No []  Yes   Fasting Blood Sugar Ranges-  Checks Blood Sugar _____ times a day  GLP1 agonist / usual dose -  GLP1 instructions:  SGLT-2 inhibitors / usual dose -  SGLT-2 instructions:   Blood Thinner / Instructions: Aspirin Instructions:  Comments:   Activity level: Patient is able / unable to climb a flight of stairs without difficulty; []  No CP  []  No SOB, but would have ___   Patient can / can not perform ADLs without assistance.   Anesthesia review:   Patient denies shortness of breath, fever, cough and chest pain at PAT appointment.  Patient verbalized understanding and agreement to the Pre-Surgical Instructions that were given to them at this PAT appointment. Patient was also educated of the need to review these PAT instructions again prior to his/her surgery.I reviewed the appropriate phone numbers to call if they have any and questions or concerns.

## 2024-01-22 NOTE — Patient Instructions (Signed)
 SURGICAL WAITING ROOM VISITATION  Patients having surgery or a procedure may have no more than 2 support people in the waiting area - these visitors may rotate.    Children under the age of 71 must have an adult with them who is not the patient.  Due to an increase in RSV and influenza rates and associated hospitalizations, children ages 62 and under may not visit patients in [facility] hospitals.  Visitors with respiratory illnesses are discouraged from visiting and should remain at home.  If the patient needs to stay at the hospital during part of their recovery, the visitor guidelines for inpatient rooms apply. Pre-op nurse will coordinate an appropriate time for 1 support person to accompany patient in pre-op.  This support person may not rotate.    Please refer to the [facility] website for the visitor guidelines for Inpatients (after your surgery is over and you are in a regular room).    Your procedure is scheduled on: 01/29/24   Report to [facility] Main Entrance    Report to admitting at  7:45 AM   Call this number if you have problems the morning of surgery [phone]   Do not eat food :After Midnight.   After Midnight you may have the following liquids until 7:00 AM the DAY OF SURGERY  Water Non-Citrus Juices (without pulp, NO RED-Apple, White grape, White cranberry) Black Coffee (NO MILK/CREAM OR CREAMERS, sugar ok)  Clear Tea (NO MILK/CREAM OR CREAMERS, sugar ok) regular and decaf                             Plain Jell-O (NO RED)                                           Fruit ices (not with fruit pulp, NO RED)                                     Popsicles (NO RED)                                                               Sports drinks like Gatorade (NO RED)              Oral Hygiene is also important to reduce your risk of infection.                                    Remember - BRUSH YOUR TEETH THE MORNING OF SURGERY WITH YOUR REGULAR  TOOTHPASTE   Do NOT smoke after Midnight   Stop all vitamins and herbal supplements 7 days before surgery.   Take these medicines the morning of surgery with A SIP OF WATER: Omeprazole              You may not have any metal on your body including hair pins, jewelry, and body piercing             Do not wear make-up,  lotions, powders, perfumes, or deodorant  Do not wear nail polish including gel and S&S, artificial/acrylic nails, or any other type of covering on natural nails including finger and toenails. If you have artificial nails, gel coating, etc. that needs to be removed by a nail salon please have this removed prior to surgery or surgery may need to be canceled/ delayed if the surgeon/ anesthesia feels like they are unable to be safely monitored.   Do not shave  48 hours prior to surgery.    Do not bring valuables to the hospital. [facility] IS NOT             RESPONSIBLE   FOR VALUABLES.   Contacts, glasses, dentures or bridgework may not be worn into surgery.  DO NOT BRING YOUR HOME MEDICATIONS TO THE HOSPITAL. PHARMACY WILL DISPENSE MEDICATIONS LISTED ON YOUR MEDICATION LIST TO YOU DURING YOUR ADMISSION IN THE HOSPITAL!    Patients discharged on the day of surgery will not be allowed to drive home.  Someone NEEDS to stay with you for the first 24 hours after anesthesia.   Special Instructions: Bring a copy of your healthcare power of attorney and living will documents the day of surgery if you haven't scanned them before.              Please read over the following fact sheets you were given: IF YOU HAVE QUESTIONS ABOUT YOUR PRE-OP INSTRUCTIONS PLEASE CALL [phone] Contras    If you received a COVID test during your pre-op visit  it is requested that you wear a mask when out in public, stay away from anyone that may not be feeling well and notify your surgeon if you develop symptoms. If you test positive for Covid or have been in contact with anyone that has tested  positive in the last 10 days please notify you surgeon.    [facility] - Preparing for Surgery Before surgery, you can play an important role.  Because skin is not sterile, your skin needs to be as free of germs as possible.  You can reduce the number of germs on your skin by washing with CHG (chlorahexidine gluconate) soap before surgery.  CHG is an antiseptic cleaner which kills germs and bonds with the skin to continue killing germs even after washing. Please DO NOT use if you have an allergy to CHG or antibacterial soaps.  If your skin becomes reddened/irritated stop using the CHG and inform your nurse when you arrive at Short Stay. Do not shave (including legs and underarms) for at least 48 hours prior to the first CHG shower.  You may shave your face/neck.  Please follow these instructions carefully:  1.  Shower with CHG Soap the night before surgery and the  morning of surgery.  2.  If you choose to wash your hair, wash your hair first as usual with your normal  shampoo.  3.  After you shampoo, rinse your hair and body thoroughly to remove the shampoo.                             4.  Use CHG as you would any other liquid soap.  You can apply chg directly to the skin and wash.  Gently with a scrungie or clean washcloth.  5.  Apply the CHG Soap to your body ONLY FROM THE NECK DOWN.   Do   not use on face/ open  Wound or open sores. Avoid contact with eyes, ears mouth and   genitals (private parts).                       Wash face,  Genitals (private parts) with your normal soap.             6.  Wash thoroughly, paying special attention to the area where your    surgery  will be performed.  7.  Thoroughly rinse your body with warm water from the neck down.  8.  DO NOT shower/wash with your normal soap after using and rinsing off the CHG Soap.                9.  Pat yourself dry with a clean towel.            10.  Wear clean pajamas.            11.  Place clean sheets  on your bed the night of your first shower and do not  sleep with pets. Day of Surgery : Do not apply any lotions/deodorants the morning of surgery.  Please wear clean clothes to the hospital/surgery center.  FAILURE TO FOLLOW THESE INSTRUCTIONS MAY RESULT IN THE CANCELLATION OF YOUR SURGERY  ________________________________________________________________________

## 2024-01-23 ENCOUNTER — Encounter (HOSPITAL_COMMUNITY): Payer: Self-pay

## 2024-01-23 NOTE — Progress Notes (Signed)
 PCP - [facility] Cardiologist - Rollene Rotunda MD   Chest x-ray - 01/16/24 Epic EKG -  09/27/23 Epic Stress Test -  ECHO - 12/09/22 Epic Cardiac Cath -    Bowel Prep - []  No  []   Yes ______   Pacemaker / ICD device []  No []  Yes   Spinal Cord Stimulator:[]  No []  Yes       History of Sleep Apnea? []  No []  Yes   CPAP used?- []  No []  Yes     Does the patient monitor blood sugar?          []  No []  Yes  []  N/A   Patient has: []  NO Hx DM   []  Pre-DM                 []  DM1  []   DM2 Does patient have a [employer] or Dex[employer]om? []  No []  Yes   Fasting Blood Sugar Ranges-  Checks Blood Sugar _____ times a day   GLP1 agonist / usual dose -  GLP1 instructions:  SGLT-2 inhibitors / usual dose -  SGLT-2 instructions:    Blood Thinner / Instructions: Aspirin Instructions:   Comments:    Activity level: Patient is able / unable to climb a flight of stairs without difficulty; []  No CP  []  No SOB, but would have ___   Patient can / can not perform ADLs without assistance.    Anesthesia review:    Patient denies shortness of breath, fever, cough and chest pain at PAT appointment.   Patient verbalized understanding and agreement to the Pre-Surgical Instructions that were given to them at this PAT appointment. Patient was also educated of the need to review these PAT instructions again prior to his/her surgery.I reviewed the appropriate phone numbers to call if they have any and questions or concerns.

## 2024-01-24 NOTE — Progress Notes (Signed)
 COVID Vaccine Completed:  Date of COVID positive in last 90 days:  PCP - [facility] [place] Cardiologist - Rollene Rotunda, MD  Chest x-ray - 01/16/24 Epic EKG - 09/27/23 Epic Stress Test -  ECHO - 12/09/22 Epic Cardiac Cath -  Pacemaker/ICD device last checked: Spinal Cord Stimulator:  Bowel Prep -   Sleep Study -  CPAP -   Fasting Blood Sugar -  Checks Blood Sugar _____ times a day  Last dose of GLP1 agonist-  N/A GLP1 instructions:  Hold 7 days before surgery    Last dose of SGLT-2 inhibitors-  N/A SGLT-2 instructions:  Hold 3 days before surgery    Blood Thinner Instructions:  Last dose:   Time: Aspirin Instructions: Last Dose:  Activity level:  Can go up a flight of stairs and perform activities of daily living without stopping and without symptoms of chest pain or shortness of breath.  Able to exercise without symptoms  Unable to go up a flight of stairs without symptoms of     Anesthesia review: hypotension, dizziness, syncope  Patient denies shortness of breath, fever, cough and chest pain at PAT appointment  Patient verbalized understanding of instructions that were given to them at the PAT appointment. Patient was also instructed that they will need to review over the PAT instructions again at home before surgery.

## 2024-01-25 ENCOUNTER — Encounter (HOSPITAL_COMMUNITY)
Admission: RE | Admit: 2024-01-25 | Discharge: 2024-01-25 | Disposition: A | Discharge status: Home / Self Care | Payer: MEDICAID | Source: Ambulatory Visit | Attending: General Surgery | Admitting: General Surgery

## 2024-01-25 ENCOUNTER — Other Ambulatory Visit: Payer: Self-pay

## 2024-01-25 ENCOUNTER — Encounter (HOSPITAL_COMMUNITY): Payer: Self-pay

## 2024-01-25 DIAGNOSIS — F419 Anxiety disorder, unspecified: Secondary | ICD-10-CM | POA: Diagnosis not present

## 2024-01-25 DIAGNOSIS — F1911 Other psychoactive substance abuse, in remission: Secondary | ICD-10-CM | POA: Diagnosis not present

## 2024-01-25 DIAGNOSIS — K219 Gastro-esophageal reflux disease without esophagitis: Secondary | ICD-10-CM | POA: Diagnosis not present

## 2024-01-25 DIAGNOSIS — K439 Ventral hernia without obstruction or gangrene: Secondary | ICD-10-CM | POA: Diagnosis not present

## 2024-01-25 DIAGNOSIS — F1011 Alcohol abuse, in remission: Secondary | ICD-10-CM | POA: Diagnosis not present

## 2024-01-25 DIAGNOSIS — F1211 Cannabis abuse, in remission: Secondary | ICD-10-CM | POA: Insufficient documentation

## 2024-01-25 DIAGNOSIS — R55 Syncope and collapse: Secondary | ICD-10-CM | POA: Insufficient documentation

## 2024-01-25 DIAGNOSIS — Z01812 Encounter for preprocedural laboratory examination: Secondary | ICD-10-CM | POA: Insufficient documentation

## 2024-01-25 DIAGNOSIS — Z01818 Encounter for other preprocedural examination: Secondary | ICD-10-CM | POA: Diagnosis present

## 2024-01-25 HISTORY — DX: Syncope and collapse: R55

## 2024-01-25 HISTORY — DX: Headache, unspecified: R51.9

## 2024-01-25 HISTORY — DX: Gastro-esophageal reflux disease without esophagitis: K21.9

## 2024-01-26 ENCOUNTER — Encounter (HOSPITAL_COMMUNITY): Payer: Self-pay

## 2024-01-26 NOTE — Anesthesia Preprocedure Evaluation (Addendum)
 Anesthesia Evaluation  Patient identified by MRN, date of birth, ID band Patient awake    Reviewed: Allergy & Precautions, NPO status , Patient's Chart, lab work & pertinent test results  Airway Mallampati: II  TM Distance: >3 FB     Dental no notable dental hx. (+) Dental Advisory Given, Teeth Intact   Pulmonary Current Smoker and Patient abstained from smoking.   Pulmonary exam normal breath sounds clear to auscultation       Cardiovascular Normal cardiovascular exam Rhythm:Regular Rate:Normal     Neuro/Psych  Headaches PSYCHIATRIC DISORDERS Anxiety     Adjustment disorder   GI/Hepatic PUD,GERD  Medicated,,(+)     substance abuse  marijuana use  Endo/Other    Renal/GU negative Renal ROS  negative genitourinary   Musculoskeletal  (+)  narcotic dependentUmbilical hernia   Abdominal   Peds  Hematology negative hematology ROS (+)   Anesthesia Other Findings   Reproductive/Obstetrics negative OB ROS                              Anesthesia Physical Anesthesia Plan  ASA: 2  Anesthesia Plan: General   Post-op Pain Management: Precedex, Tylenol PO (pre-op)*, Dilaudid IV and Ketamine IV*   Induction:   PONV Risk Score and Plan: 4 or greater and Treatment may vary due to age or medical condition, Midazolam, Ondansetron and Dexamethasone  Airway Management Planned: Oral ETT  Additional Equipment: None  Intra-op Plan:   Post-operative Plan: Extubation in OR  Informed Consent: I have reviewed the patients History and Physical, chart, labs and discussed the procedure including the risks, benefits and alternatives for the proposed anesthesia with the patient or authorized representative who has indicated his/her understanding and acceptance.     Dental advisory given  Plan Discussed with: CRNA and Anesthesiologist  Anesthesia Plan Comments: (See PAT note from 2/20 by Sherlie Ban  PA-C )         Anesthesia Quick Evaluation
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
## 2024-01-27 ENCOUNTER — Encounter (HOSPITAL_COMMUNITY): Payer: Self-pay | Admitting: General Surgery

## 2024-01-29 ENCOUNTER — Other Ambulatory Visit: Payer: Self-pay

## 2024-01-29 ENCOUNTER — Ambulatory Visit (HOSPITAL_COMMUNITY): Payer: MEDICAID | Admitting: Medical

## 2024-01-29 ENCOUNTER — Ambulatory Visit (HOSPITAL_BASED_OUTPATIENT_CLINIC_OR_DEPARTMENT_OTHER): Payer: MEDICAID | Admitting: Anesthesiology

## 2024-01-29 ENCOUNTER — Ambulatory Visit (HOSPITAL_COMMUNITY)
Admission: RE | Admit: 2024-01-29 | Discharge: 2024-01-29 | Disposition: A | Discharge status: Home / Self Care | Payer: MEDICAID | Attending: General Surgery | Admitting: General Surgery

## 2024-01-29 ENCOUNTER — Encounter (HOSPITAL_COMMUNITY): Payer: Self-pay | Admitting: General Surgery

## 2024-01-29 ENCOUNTER — Encounter (HOSPITAL_COMMUNITY)
Admission: RE | Disposition: A | Discharge status: Home / Self Care | Payer: Self-pay | Source: Home / Self Care | Attending: General Surgery

## 2024-01-29 DIAGNOSIS — K429 Umbilical hernia without obstruction or gangrene: Secondary | ICD-10-CM | POA: Insufficient documentation

## 2024-01-29 DIAGNOSIS — K219 Gastro-esophageal reflux disease without esophagitis: Secondary | ICD-10-CM | POA: Insufficient documentation

## 2024-01-29 DIAGNOSIS — Z8711 Personal history of peptic ulcer disease: Secondary | ICD-10-CM | POA: Diagnosis not present

## 2024-01-29 DIAGNOSIS — K439 Ventral hernia without obstruction or gangrene: Secondary | ICD-10-CM

## 2024-01-29 DIAGNOSIS — Z8719 Personal history of other diseases of the digestive system: Secondary | ICD-10-CM

## 2024-01-29 HISTORY — PX: SUPRA-UMBILICAL HERNIA: SHX6105

## 2024-01-29 HISTORY — PX: UMBILICAL HERNIA REPAIR: SHX196

## 2024-01-29 LAB — CBC WITH DIFFERENTIAL/PLATELET
Abs Immature Granulocytes: 0.03 10*3/uL (ref 0.00–0.07)
Basophils Absolute: 0 10*3/uL (ref 0.0–0.1)
Basophils Relative: 0 %
Eosinophils Absolute: 0.4 10*3/uL (ref 0.0–0.5)
Eosinophils Relative: 5 %
HCT: 38.6 % (ref 36.0–46.0)
Hemoglobin: 12.8 g/dL (ref 12.0–15.0)
Immature Granulocytes: 0 %
Lymphocytes Relative: 42 %
Lymphs Abs: 3 10*3/uL (ref 0.7–4.0)
MCH: 29.7 pg (ref 26.0–34.0)
MCHC: 33.2 g/dL (ref 30.0–36.0)
MCV: 89.6 fL (ref 80.0–100.0)
Monocytes Absolute: 0.5 10*3/uL (ref 0.1–1.0)
Monocytes Relative: 7 %
Neutro Abs: 3.3 10*3/uL (ref 1.7–7.7)
Neutrophils Relative %: 46 %
Platelets: 235 10*3/uL (ref 150–400)
RBC: 4.31 MIL/uL (ref 3.87–5.11)
RDW: 11.9 % (ref 11.5–15.5)
WBC: 7.2 10*3/uL (ref 4.0–10.5)
nRBC: 0 % (ref 0.0–0.2)

## 2024-01-29 LAB — BASIC METABOLIC PANEL
Anion gap: 7 (ref 5–15)
BUN: 14 mg/dL (ref 6–20)
CO2: 28 mmol/L (ref 22–32)
Calcium: 8.7 mg/dL — ABNORMAL LOW (ref 8.9–10.3)
Chloride: 103 mmol/L (ref 98–111)
Creatinine, Ser: 0.81 mg/dL (ref 0.44–1.00)
GFR, Estimated: >60 mL/min (ref >60)
Glucose, Bld: 86 mg/dL (ref 70–99)
Potassium: 3.6 mmol/L (ref 3.5–5.1)
Sodium: 138 mmol/L (ref 135–145)

## 2024-01-29 LAB — POCT PREGNANCY, URINE: Preg Test, Ur: NEGATIVE

## 2024-01-29 SURGERY — REPAIR, HERNIA, UMBILICAL, LAPAROSCOPIC
Anesthesia: General

## 2024-01-29 MED ORDER — MIDAZOLAM HCL 2 MG/2ML IJ SOLN
INTRAMUSCULAR | Status: DC | PRN
  Administered 2024-01-29: 2 mg via INTRAVENOUS

## 2024-01-29 MED ORDER — DROPERIDOL 2.5 MG/ML IJ SOLN
INTRAMUSCULAR | Status: DC
Start: 2024-01-29 — End: 2024-01-30
  Filled 2024-01-29: qty 2

## 2024-01-29 MED ORDER — OXYCODONE HCL 5 MG PO TABS: 5.0000 mg | ORAL_TABLET | Freq: Four times a day (QID) | ORAL | 0 refills | Status: DC | PRN

## 2024-01-29 MED ORDER — OXYCODONE HCL 5 MG/5ML PO SOLN: 5.0000 mg | Freq: Once | ORAL | Status: DC | PRN

## 2024-01-29 MED ORDER — HYDROMORPHONE HCL 2 MG/ML IJ SOLN
INTRAMUSCULAR | Status: AC
  Filled 2024-01-29: qty 1

## 2024-01-29 MED ORDER — CHLORHEXIDINE GLUCONATE 0.12 % MT SOLN
15.0000 mL | Freq: Once | OROMUCOSAL | Status: AC
  Administered 2024-01-29: 15 mL via OROMUCOSAL

## 2024-01-29 MED ORDER — HYDROMORPHONE HCL 1 MG/ML IJ SOLN
INTRAMUSCULAR | Status: DC | PRN
  Administered 2024-01-29: .6 mg via INTRAVENOUS
  Administered 2024-01-29 (×2): .4 mg via INTRAVENOUS
  Administered 2024-01-29: .2 mg via INTRAVENOUS
  Administered 2024-01-29: .4 mg via INTRAVENOUS

## 2024-01-29 MED ORDER — OXYCODONE HCL 5 MG PO TABS: 5.0000 mg | ORAL_TABLET | Freq: Once | ORAL | Status: DC | PRN

## 2024-01-29 MED ORDER — CEFAZOLIN SODIUM-DEXTROSE 2-4 GM/100ML-% IV SOLN
2.0000 g | INTRAVENOUS | Status: AC
Start: 2024-01-29 — End: 2024-01-29
  Administered 2024-01-29: 2 g via INTRAVENOUS
  Filled 2024-01-29: qty 100

## 2024-01-29 MED ORDER — CHLORHEXIDINE GLUCONATE CLOTH 2 % EX PADS: 6.0000 | MEDICATED_PAD | Freq: Once | CUTANEOUS | Status: DC

## 2024-01-29 MED ORDER — ONDANSETRON HCL 4 MG/2ML IJ SOLN
INTRAMUSCULAR | Status: DC | PRN
  Administered 2024-01-29: 4 mg via INTRAVENOUS

## 2024-01-29 MED ORDER — MIDAZOLAM HCL 2 MG/2ML IJ SOLN
2.0000 mg | Freq: Once | INTRAMUSCULAR | Status: AC
  Administered 2024-01-29: 2 mg via INTRAVENOUS
  Filled 2024-01-29: qty 2

## 2024-01-29 MED ORDER — DEXAMETHASONE SODIUM PHOSPHATE 10 MG/ML IJ SOLN
INTRAMUSCULAR | Status: DC | PRN
  Administered 2024-01-29: 8 mg via INTRAVENOUS

## 2024-01-29 MED ORDER — ROCURONIUM BROMIDE 10 MG/ML (PF) SYRINGE
PREFILLED_SYRINGE | INTRAVENOUS | Status: DC | PRN
  Administered 2024-01-29: 70 mg via INTRAVENOUS
  Administered 2024-01-29: 5 mg via INTRAVENOUS

## 2024-01-29 MED ORDER — DEXMEDETOMIDINE HCL IN NACL 80 MCG/20ML IV SOLN
INTRAVENOUS | Status: DC | PRN
  Administered 2024-01-29: 12 ug via INTRAVENOUS

## 2024-01-29 MED ORDER — LIDOCAINE HCL (PF) 2 % IJ SOLN
INTRAMUSCULAR | Status: AC
  Filled 2024-01-29: qty 5

## 2024-01-29 MED ORDER — ORAL CARE MOUTH RINSE: 15.0000 mL | Freq: Once | OROMUCOSAL | Status: AC

## 2024-01-29 MED ORDER — DIPHENHYDRAMINE HCL 12.5 MG/5ML PO ELIX: 12.5000 mg | ORAL_SOLUTION | Freq: Four times a day (QID) | ORAL | Status: DC | PRN

## 2024-01-29 MED ORDER — HYDROMORPHONE HCL 1 MG/ML IJ SOLN
0.2500 mg | INTRAMUSCULAR | Status: DC | PRN
  Administered 2024-01-29 (×3): 0.5 mg via INTRAVENOUS

## 2024-01-29 MED ORDER — DEXAMETHASONE SODIUM PHOSPHATE 10 MG/ML IJ SOLN
INTRAMUSCULAR | Status: AC
Start: 2024-01-29 — End: ?
  Filled 2024-01-29: qty 1

## 2024-01-29 MED ORDER — LACTATED RINGERS IV SOLN: INTRAVENOUS | Status: DC

## 2024-01-29 MED ORDER — MIDAZOLAM HCL 2 MG/2ML IJ SOLN
INTRAMUSCULAR | Status: AC
  Filled 2024-01-29: qty 2

## 2024-01-29 MED ORDER — ONDANSETRON HCL 4 MG/2ML IJ SOLN
INTRAMUSCULAR | Status: AC
  Filled 2024-01-29: qty 2

## 2024-01-29 MED ORDER — 0.9 % SODIUM CHLORIDE (POUR BTL) OPTIME
TOPICAL | Status: DC | PRN
  Administered 2024-01-29: 1000 mL

## 2024-01-29 MED ORDER — ROCURONIUM BROMIDE 10 MG/ML (PF) SYRINGE
PREFILLED_SYRINGE | INTRAVENOUS | Status: AC
  Filled 2024-01-29: qty 10

## 2024-01-29 MED ORDER — OXYCODONE HCL 5 MG PO TABS: 5.0000 mg | ORAL_TABLET | ORAL | Status: DC | PRN

## 2024-01-29 MED ORDER — FENTANYL CITRATE (PF) 100 MCG/2ML IJ SOLN
INTRAMUSCULAR | Status: AC
  Filled 2024-01-29: qty 2

## 2024-01-29 MED ORDER — FENTANYL CITRATE (PF) 250 MCG/5ML IJ SOLN
INTRAMUSCULAR | Status: DC | PRN
  Administered 2024-01-29: 100 ug via INTRAVENOUS

## 2024-01-29 MED ORDER — HYDROMORPHONE HCL 1 MG/ML IJ SOLN
0.5000 mg | INTRAMUSCULAR | Status: DC | PRN
  Administered 2024-01-29: 0.5 mg via INTRAVENOUS

## 2024-01-29 MED ORDER — PROPOFOL 10 MG/ML IV BOLUS
INTRAVENOUS | Status: AC
Start: 2024-01-29 — End: ?
  Filled 2024-01-29: qty 20

## 2024-01-29 MED ORDER — ACETAMINOPHEN 500 MG PO TABS
1000.0000 mg | ORAL_TABLET | ORAL | Status: AC
  Administered 2024-01-29: 1000 mg via ORAL
  Filled 2024-01-29: qty 2

## 2024-01-29 MED ORDER — HYDROMORPHONE HCL 1 MG/ML IJ SOLN
INTRAMUSCULAR | Status: AC
  Administered 2024-01-29: 0.5 mg via INTRAVENOUS
  Filled 2024-01-29: qty 1

## 2024-01-29 MED ORDER — METHOCARBAMOL 500 MG PO TABS: 500.0000 mg | ORAL_TABLET | Freq: Three times a day (TID) | ORAL | 0 refills | Status: DC | PRN

## 2024-01-29 MED ORDER — SUGAMMADEX SODIUM 200 MG/2ML IV SOLN
INTRAVENOUS | Status: DC | PRN
  Administered 2024-01-29: 200 mg via INTRAVENOUS

## 2024-01-29 MED ORDER — SCOPOLAMINE 1 MG/3DAYS TD PT72
1.0000 | MEDICATED_PATCH | TRANSDERMAL | Status: DC
  Administered 2024-01-29: 1.5 mg via TRANSDERMAL
  Filled 2024-01-29: qty 1

## 2024-01-29 MED ORDER — DIPHENHYDRAMINE HCL 50 MG/ML IJ SOLN: 12.5000 mg | Freq: Four times a day (QID) | INTRAMUSCULAR | Status: DC | PRN

## 2024-01-29 MED ORDER — IBUPROFEN 800 MG PO TABS: 800.0000 mg | ORAL_TABLET | Freq: Three times a day (TID) | ORAL | 0 refills | Status: DC | PRN

## 2024-01-29 MED ORDER — KETOROLAC TROMETHAMINE 15 MG/ML IJ SOLN
INTRAMUSCULAR | Status: AC
  Administered 2024-01-29: 15 mg via INTRAVENOUS
  Filled 2024-01-29: qty 1

## 2024-01-29 MED ORDER — HYDROMORPHONE HCL 1 MG/ML IJ SOLN
INTRAMUSCULAR | Status: AC
  Administered 2024-01-29: 0.5 mg via INTRAVENOUS
  Filled 2024-01-29: qty 2

## 2024-01-29 MED ORDER — DROPERIDOL 2.5 MG/ML IJ SOLN
0.6250 mg | Freq: Once | INTRAMUSCULAR | Status: AC | PRN
  Administered 2024-01-29: 0.625 mg via INTRAVENOUS

## 2024-01-29 MED ORDER — STERILE WATER FOR IRRIGATION IR SOLN
Status: DC | PRN
  Administered 2024-01-29: 1000 mL

## 2024-01-29 MED ORDER — LIDOCAINE HCL (CARDIAC) PF 100 MG/5ML IV SOSY
PREFILLED_SYRINGE | INTRAVENOUS | Status: DC | PRN
  Administered 2024-01-29: 60 mg via INTRATRACHEAL

## 2024-01-29 MED ORDER — ACETAMINOPHEN 500 MG PO TABS: 1000.0000 mg | ORAL_TABLET | Freq: Four times a day (QID) | ORAL | Status: DC

## 2024-01-29 MED ORDER — ONDANSETRON HCL 4 MG/2ML IJ SOLN: 4.0000 mg | Freq: Once | INTRAMUSCULAR | Status: DC | PRN

## 2024-01-29 MED ORDER — KETOROLAC TROMETHAMINE 30 MG/ML IJ SOLN: 30.0000 mg | Freq: Three times a day (TID) | INTRAMUSCULAR | Status: DC

## 2024-01-29 MED ORDER — KETOROLAC TROMETHAMINE 15 MG/ML IJ SOLN: 15.0000 mg | Freq: Once | INTRAMUSCULAR | Status: AC

## 2024-01-29 MED ORDER — BUPIVACAINE-EPINEPHRINE 0.25% -1:200000 IJ SOLN
INTRAMUSCULAR | Status: DC | PRN
  Administered 2024-01-29: 40 mL

## 2024-01-29 MED ORDER — PROPOFOL 10 MG/ML IV BOLUS
INTRAVENOUS | Status: DC | PRN
  Administered 2024-01-29: 180 mg via INTRAVENOUS
  Administered 2024-01-29: 80 ug/kg/min via INTRAVENOUS

## 2024-01-29 MED ORDER — BUPIVACAINE-EPINEPHRINE 0.25% -1:200000 IJ SOLN
INTRAMUSCULAR | Status: AC
  Filled 2024-01-29: qty 1

## 2024-01-29 SURGICAL SUPPLY — 44 items
BAG COUNTER SPONGE SURGICOUNT (BAG) IMPLANT
BENZOIN TINCTURE PRP APPL 2/3 (GAUZE/BANDAGES/DRESSINGS) IMPLANT
BINDER ABDOMINAL 12 ML 46-62 (SOFTGOODS) IMPLANT
CABLE HIGH FREQUENCY MONO STRZ (ELECTRODE) IMPLANT
CHLORAPREP W/TINT 26 (MISCELLANEOUS) ×1 IMPLANT
COVER SURGICAL LIGHT HANDLE (MISCELLANEOUS) ×1 IMPLANT
DERMABOND ADVANCED .7 DNX12 (GAUZE/BANDAGES/DRESSINGS) IMPLANT
DEVICE SECURE STRAP 25 ABSORB (INSTRUMENTS) IMPLANT
DEVICE TROCAR PUNCTURE CLOSURE (ENDOMECHANICALS) ×1 IMPLANT
DRAIN CHANNEL 19F RND (DRAIN) IMPLANT
ELECT PENCIL ROCKER SW 15FT (MISCELLANEOUS) IMPLANT
EVACUATOR SILICONE 100CC (DRAIN) IMPLANT
GLOVE BIO SURGEON STRL SZ7.5 (GLOVE) ×1 IMPLANT
GLOVE INDICATOR 8.0 STRL GRN (GLOVE) ×1 IMPLANT
GOWN STRL REUS W/ TWL XL LVL3 (GOWN DISPOSABLE) ×1 IMPLANT
IRRIG SUCT STRYKERFLOW 2 WTIP (MISCELLANEOUS) IMPLANT
IRRIGATION SUCT STRKRFLW 2 WTP (MISCELLANEOUS) IMPLANT
KIT BASIN OR (CUSTOM PROCEDURE TRAY) ×1 IMPLANT
KIT TURNOVER KIT A (KITS) IMPLANT
L-HOOK LAP DISP 36CM (ELECTROSURGICAL) ×1 IMPLANT
LHOOK LAP DISP 36CM (ELECTROSURGICAL) IMPLANT
MARKER SKIN DUAL TIP RULER LAB (MISCELLANEOUS) ×1 IMPLANT
MESH VENTRALIGHT ST 4X6IN (Mesh General) IMPLANT
NDL SPNL 22GX3.5 QUINCKE BK (NEEDLE) ×1 IMPLANT
NEEDLE SPNL 22GX3.5 QUINCKE BK (NEEDLE) ×1 IMPLANT
SCISSORS LAP 5X35 DISP (ENDOMECHANICALS) IMPLANT
SET TUBE SMOKE EVAC HIGH FLOW (TUBING) ×1 IMPLANT
SHEARS HARMONIC 36 ACE (MISCELLANEOUS) IMPLANT
SLEEVE ADV FIXATION 5X100MM (TROCAR) IMPLANT
SPIKE FLUID TRANSFER (MISCELLANEOUS) ×1 IMPLANT
STRIP CLOSURE SKIN 1/2X4 (GAUZE/BANDAGES/DRESSINGS) IMPLANT
SUT ETHILON 2 0 PS N (SUTURE) IMPLANT
SUT MNCRL AB 4-0 PS2 18 (SUTURE) ×1 IMPLANT
SUT NOVA 1 T20/GS 25DT (SUTURE) IMPLANT
SUT NOVA NAB GS-21 0 18 T12 DT (SUTURE) IMPLANT
SUT VIC AB 0 UR5 27 (SUTURE) IMPLANT
SUT VIC AB 3-0 SH 18 (SUTURE) IMPLANT
TOWEL OR 17X26 10 PK STRL BLUE (TOWEL DISPOSABLE) ×1 IMPLANT
TRAY FOLEY MTR SLVR 14FR STAT (SET/KITS/TRAYS/PACK) IMPLANT
TRAY FOLEY MTR SLVR 16FR STAT (SET/KITS/TRAYS/PACK) IMPLANT
TRAY LAPAROSCOPIC (CUSTOM PROCEDURE TRAY) ×1 IMPLANT
TROCAR ADV FIXATION 12X100MM (TROCAR) IMPLANT
TROCAR ADV FIXATION 5X100MM (TROCAR) ×1 IMPLANT
TROCAR XCEL NON-BLD 5MMX100MML (ENDOMECHANICALS) ×1 IMPLANT

## 2024-01-29 NOTE — Transfer of Care (Signed)
 Immediate Anesthesia Transfer of Care Note  Patient: Debbie Griffin  Procedure(s) Performed: LAPAROSCOPIC UMBILICAL HERNIA WITH MESH SUPRA-UMBILICAL HERNIA WITH MESH  Patient Location: PACU  Anesthesia Type:General  Level of Consciousness: sedated  Airway & Oxygen Therapy: Patient Spontanous Breathing  Post-op Assessment: Report given to RN  Post vital signs: Reviewed and stable  Last Vitals:  Vitals Value Taken Time  BP 152/88 01/29/24 1239  Temp    Pulse 49 01/29/24 1241  Resp 29 01/29/24 1241  SpO2 100 % 01/29/24 1241  Vitals shown include unfiled device data.  Last Pain:  Vitals:   01/29/24 0838  TempSrc: Oral  PainSc: 4          Complications: No notable events documented.
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
## 2024-01-29 NOTE — Anesthesia Procedure Notes (Signed)
 Procedure Name: Intubation Date/Time: 01/29/2024 11:03 AM  Performed by: Randa Evens, CRNAPre-anesthesia Checklist: Patient identified, Emergency Drugs available, Suction available and Patient being monitored Patient Re-evaluated:Patient Re-evaluated prior to induction Oxygen Delivery Method: Circle System Utilized Preoxygenation: Pre-oxygenation with 100% oxygen Induction Type: IV induction Ventilation: Mask ventilation without difficulty Laryngoscope Size: Mac and 3 Grade View: Grade I Tube type: Oral Tube size: 7.0 mm Number of attempts: 1 Airway Equipment and Method: Stylet and Oral airway Placement Confirmation: ETT inserted through vocal cords under direct vision, positive ETCO2 and breath sounds checked- equal and bilateral Secured at: 22 cm Tube secured with: Tape Dental Injury: Teeth and Oropharynx as per pre-operative assessment

## 2024-01-29 NOTE — Op Note (Signed)
 Debbie Griffin [id number] [birth year] 01/29/2024   Laparoscopic Assisted Repair of Ventral Hernias (5 x 2 cm) with Mesh Procedure Note  Indications: Symptomatic ventral hernias (umbilical and supraumbilical), total hernia defects 5 x 2 cm.  Patient had a umbilical hernia as well as a supraumbilical hernia.  On exam, the total hernia measured 5 cm by about 2 cm.   Pre-operative Diagnosis: Symptomatic ventral (umbilical and supraumbilical) hernias, 5 cm x 2 cm Post-operative Diagnosis: Same  Surgeon: Gaynelle Adu MD FACS  Assistants: none  Anesthesia: General endotracheal anesthesia   Procedure Details  The patient was seen in the Holding Room. The risks, benefits, complications, treatment options, and expected outcomes were discussed with the patient. The possibilities of reaction to medication, pulmonary aspiration, perforation of viscus, bleeding, recurrent infection, the need for additional procedures, failure to diagnose a condition, and creating a complication requiring transfusion or operation were discussed with the patient. The patient concurred with the proposed plan, giving informed consent.  The site of surgery properly noted/marked. The patient was taken to the operating room, identified as Debbie Griffin  and the procedure verified as laparoscopic assisted hernia repair with mesh. A Time Out was held and the above information confirmed.    The patient was placed supine.  After establishing general anesthesia,  Left arm tucked with the appropriate padding.  The abdomen was prepped with Chloraprep and draped in standard fashion.  The patient had 2 hernias one a umbilical hernia and a supraumbilical hernia. -The total defect measured approximately 5 cm x 2 cm.  There was a delay in the availability of a laparoscopic camera so I started with the open portion of the procedure first.  A curvilinear infraumbilical incision was created. Dissection was carried down to the hernia sac located  above the fascia and was mobilized from surrounding structures. Intact fascia was identified circumferentially around the defect. This defect at the umbilicus was about 2 cm wide by 1.5 cm.  The hernia sac was excised and discarded.  Then palpated and found to the supraumbilical hernia defect.  At this point the laparoscopic camera was available.   A 5 mm Optiview was used the cannulate the peritoneal cavity in the left upper quadrant below the costal margin.  Pneumoperitoneum was obtained by insufflating CO2, maintaining a maximum pressure of 15 mmHg.  The 5 mm 30-degree laparoscopic was inserted.  I put towel clips on the umbilical fascial defect to maintain pneumoperitoneum.  We visualized the supraumbilical fascial hernia.  It contained preperitoneal fat.  An additional 5 mm trocar was placed in the left lower quadrant under direct visualization.  I then reduced the herniated fat and amputated it with laparoscopic electrocautery.  We then took down a portion of the falciform ligament.  When visualizing the umbilical fascial defect I decided to go and take down some of the medial umbilical ligament so that the mesh would lay flat against the abdominal wall.  There were no other fascial defects visualized in the abdominal wall.  I then returned to the open portion of the case.  I removed the piece of herniated fat through the umbilical opening that had been taken down laparoscopically   With adding a 5 cm overlap circumferentially this gave [place] a mesh requirement of a oval 15.2 x 10.2 cm.  Therefore I selected a oval piece of Bard ventral light ST mesh measuring 15.2 x 10.2 cm We placed stay sutures in the mesh, 1 at the apex of the mesh consisting of a  0 Vicryl, 1 in the center consisting of a 1-0 Novafil, and 1 at the inferior aspect of the mesh consisting with 0 Vicryl.  The mesh was then rolled up and inserted through the fascial defect. The fascia was then closed primarily over the mesh with 4  interrupted 1-0-novafil sutures.  The supraumbilical fascial defect was closed with 3 interrupted 1-0 Novafil sutures.  I then returned laparoscopically.  I then infiltrated local in a regional fashion in the preperitoneal space around the umbilical fascial closure and around the expected locations of where the transfascial sutures & tacks would be placed.  The mesh was then unrolled.  The stay sutures were then pulled up through small stab incisions using the Endo-close device.  This deployed the mesh widely over the closed fascial defects.  The stay sutures were then tied down.  The Secure Strap device was then used to tack down the edges of the mesh at 1 cm intervals circumferentially. We placed a few tacks inside the outer ring of tacks.  We inspected for hemostasis.  There was no evidence of injury to surrounding structures.  Pneumoperitoneum was then released as we removed the remainder of the trocars.   The umbilical cavity was irrigated. The umbilical stalk was then tacked back down to the fascia with one 3-0 vicryl sutures.  The port sites and umbilical incision were closed with 4-0 Monocryl.  All of the incisions and stay suture sites were then covered  with dermabond.  An abdominal binder was placed around the patient's abdomen.  Foley removed. The patient was extubated and brought to the recovery room in stable condition.  All sponge, instrument, and needle counts were correct prior to closure and at the conclusion of the case.   Findings: Type of repair - primary suture with mesh underlay Name of mesh - Bard VentralightST  Size of mesh - oval 15.2 x 10.2 cm  Mesh overlap - >5 cm  Placement of mesh - beneath fascia and into peritoneal cavity,  Estimated Blood Loss:  Minimal         Complications:  None; patient tolerated the procedure well.         Disposition: PACU - hemodynamically stable.         Condition: stable  Mary Sella. Andrey Campanile, MD, FACS General, Bariatric, & Minimally  Invasive Surgery [facility],  A [facility]
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
## 2024-01-29 NOTE — Discharge Instructions (Signed)
 UMBILICAL OR INGUINAL HERNIA REPAIR: POST OP INSTRUCTIONS  Always review your discharge instruction sheet given to you by the facility where your surgery was performed. IF YOU HAVE DISABILITY OR FAMILY LEAVE FORMS, YOU MUST BRING THEM TO THE OFFICE FOR PROCESSING.   DO NOT GIVE THEM TO YOUR DOCTOR.  A  prescription for pain medication may be given to you upon discharge.  Take your pain medication as prescribed, if needed.  If narcotic pain medicine is not needed, then you may take acetaminophen (Tylenol) or ibuprofen (Advil) as needed. Take your usually prescribed medications unless otherwise directed. If you need a refill on your pain medication, please contact your pharmacy.  They will contact our office to request authorization. Prescriptions will not be filled after 5 pm or on week-ends. You should follow a light diet the first 24 hours after arrival home, such as soup and crackers, etc.  Be sure to include lots of fluids daily.  Resume your normal diet the day after surgery. Most patients will experience some swelling and bruising around the umbilicus or in the groin and scrotum.  Ice packs and reclining will help.  Swelling and bruising can take several days to resolve.  It is common to experience some constipation if taking pain medication after surgery.  Increasing fluid intake and taking a stool softener (such as Colace) will usually help or prevent this problem from occurring.  A mild laxative (Milk of Magnesia or Miralax) should be taken according to package directions if there are no bowel movements after 48 hours. Unless discharge instructions indicate otherwise, you may remove your bandages 24-48 hours after surgery, and you may shower at that time.  You may have steri-strips (small skin tapes) in place directly over the incision.  These strips should be left on the skin for 7-10 days.  If your surgeon used skin glue on the incision, you may shower in 24 hours.  The glue will flake off  over the next 2-3 weeks.  Any sutures or staples will be removed at the office during your follow-up visit. ACTIVITIES:  You may resume regular (light) daily activities beginning the next day--such as daily self-care, walking, climbing stairs--gradually increasing activities as tolerated.  You may have sexual intercourse when it is comfortable.  Refrain from any heavy lifting or straining until approved by your doctor. You may drive when you are no longer taking prescription pain medication, you can comfortably wear a seatbelt, and you can safely maneuver your car and apply brakes. RETURN TO WORK:  You should see your doctor in the office for a follow-up appointment approximately 3 weeks after your surgery.  Make sure that you call for this appointment within a day or two after you arrive home to insure a convenient appointment time. OTHER INSTRUCTIONS:     WHEN TO CALL YOUR DOCTOR: Fever over 101.0 Inability to urinate Nausea and/or vomiting Extreme swelling or bruising Continued bleeding from incision. Increased pain, redness, or drainage from the incision  The clinic staff is available to answer your questions during regular business hours.  Please don't hesitate to call and ask to speak to one of the nurses for clinical concerns.  If you have a medical emergency, go to the nearest emergency room or call 911.  A surgeon from [facility] is always on call at the hospital   [address], [address], [place], [place]  [address] ?  [address] [address], [place], [place]   [address] [phone] ?  [phone] ? FAX [phone] Web site: [email or web address]     Managing Your Pain After Surgery Without Opioids    Thank you for participating in our program to help patients manage their pain after surgery without opioids. This is part of our effort to provide you with the best care possible, without exposing you or your family to the risk that opioids pose.  What  pain can I expect after surgery? You can expect to have some pain after surgery. This is normal. The pain is typically worse the day after surgery, and quickly begins to get better. Many studies have found that many patients are able to manage their pain after surgery with Over-the-Counter (OTC) medications such as Tylenol and Motrin. If you have a condition that does not allow you to take Tylenol or Motrin, notify your surgical team.  How will I manage my pain? The best strategy for controlling your pain after surgery is around the clock pain control with Tylenol (acetaminophen) and Motrin (ibuprofen or Advil). Alternating these medications with each other allows you to maximize your pain control. In addition to Tylenol and Motrin, you can use heating pads or ice packs on your incisions to help reduce your pain.  How will I alternate your regular strength over-the-counter pain medication? You will take a dose of pain medication every three hours. Start by taking 650 mg of Tylenol (2 pills of 325 mg) 3 hours later take 600 mg of Motrin (3 pills of 200 mg) 3 hours after taking the Motrin take 650 mg of Tylenol 3 hours after that take 600 mg of Motrin.   - 1 -  See example - if your first dose of Tylenol is at 12:00 PM   12:00 PM Tylenol 650 mg (2 pills of 325 mg)  3:00 PM Motrin 600 mg (3 pills of 200 mg)  6:00 PM Tylenol 650 mg (2 pills of 325 mg)  9:00 PM Motrin 600 mg (3 pills of 200 mg)  Continue alternating every 3 hours   We recommend that you follow this schedule around-the-clock for at least 3 days after surgery, or until you feel that it is no longer needed. Use the table on the last page of this handout to keep track of the medications you are taking. Important: Do not take more than 3000mg  of Tylenol or 1800mg  of Motrin in a 24-hour period. Do not take ibuprofen/Motrin if you have a history of bleeding stomach ulcers, severe kidney disease, &/or actively taking a blood  thinner  What if I still have pain? If you have pain that is not controlled with the over-the-counter pain medications (Tylenol and Motrin or Advil) you might have what we call "breakthrough" pain. You will receive a prescription for a small amount of an opioid pain medication such as Oxycodone, Tramadol, or Tylenol with Codeine. Use these opioid pills in the first 24 hours after surgery if you have breakthrough pain. Do not take more than 1 pill every 4-6 hours.  If you still have uncontrolled pain after using all opioid pills, don't hesitate to call our staff using the number provided. We will help make sure you are managing your pain in the best way possible, and if necessary, we can provide a prescription for additional pain medication.   Day 1    Time  Name of Medication Number of pills taken  Amount of Acetaminophen  Pain Level   Comments  AM PM       AM PM  AM PM       AM PM       AM PM       AM PM       AM PM       AM PM       Total Daily amount of Acetaminophen Do not take more than  3,000 mg per day      Day 2    Time  Name of Medication Number of pills taken  Amount of Acetaminophen  Pain Level   Comments  AM PM       AM PM       AM PM       AM PM       AM PM       AM PM       AM PM       AM PM       Total Daily amount of Acetaminophen Do not take more than  3,000 mg per day      Day 3    Time  Name of Medication Number of pills taken  Amount of Acetaminophen  Pain Level   Comments  AM PM       AM PM       AM PM       AM PM         AM PM       AM PM       AM PM       AM PM       Total Daily amount of Acetaminophen Do not take more than  3,000 mg per day      Day 4    Time  Name of Medication Number of pills taken  Amount of Acetaminophen  Pain Level   Comments  AM PM       AM PM       AM PM       AM PM       AM PM       AM PM       AM PM       AM PM       Total Daily amount of Acetaminophen Do not take more than   3,000 mg per day      Day 5    Time  Name of Medication Number of pills taken  Amount of Acetaminophen  Pain Level   Comments  AM PM       AM PM       AM PM       AM PM       AM PM       AM PM       AM PM       AM PM       Total Daily amount of Acetaminophen Do not take more than  3,000 mg per day      Day 6    Time  Name of Medication Number of pills taken  Amount of Acetaminophen  Pain Level  Comments  AM PM       AM PM       AM PM       AM PM       AM PM       AM PM       AM PM       AM PM       Total Daily amount of Acetaminophen  Do not take more than  3,000 mg per day      Day 7    Time  Name of Medication Number of pills taken  Amount of Acetaminophen  Pain Level   Comments  AM PM       AM PM       AM PM       AM PM       AM PM       AM PM       AM PM       AM PM       Total Daily amount of Acetaminophen Do not take more than  3,000 mg per day        For additional information about how and where to safely dispose of unused opioid medications - [email or web address]  Disclaimer: This document contains information and/or instructional materials adapted from [facility] for the typical patient with your condition. It does not replace medical advice from your health care provider because your experience may differ from that of the typical patient. Talk to your health care provider if you have any questions about this document, your condition or your treatment plan. Adapted from [facility]

## 2024-01-29 NOTE — Anesthesia Postprocedure Evaluation (Signed)
 Anesthesia Post Note  Patient: Debbie Griffin  Procedure(s) Performed: LAPAROSCOPIC UMBILICAL HERNIA WITH MESH SUPRA-UMBILICAL HERNIA WITH MESH     Patient location during evaluation: PACU Anesthesia Type: General Level of consciousness: awake and alert and oriented Pain management: pain level controlled Vital Signs Assessment: post-procedure vital signs reviewed and stable Respiratory status: spontaneous breathing, nonlabored ventilation and respiratory function stable Cardiovascular status: blood pressure returned to baseline and stable Postop Assessment: no apparent nausea or vomiting Anesthetic complications: no   No notable events documented.  Last Vitals:  Vitals:   01/29/24 1245 01/29/24 1300  BP: (!) 154/71 (!) 149/85  Pulse: (!) 49 (!) 51  Resp: 13 (!) 9  Temp:  36.9 C  SpO2: 100% 100%    Last Pain:  Vitals:   01/29/24 1300  TempSrc:   PainSc: 10-Worst pain ever                 Pansy Ostrovsky A.

## 2024-01-30 ENCOUNTER — Encounter (HOSPITAL_COMMUNITY): Payer: Self-pay | Admitting: General Surgery

## 2024-08-14 ENCOUNTER — Encounter: Payer: Self-pay | Admitting: Physician Assistant

## 2024-08-14 ENCOUNTER — Ambulatory Visit (INDEPENDENT_AMBULATORY_CARE_PROVIDER_SITE_OTHER): Payer: MEDICAID | Admitting: Physician Assistant

## 2024-08-14 VITALS — BP 155/102 | HR 109

## 2024-08-14 DIAGNOSIS — D229 Melanocytic nevi, unspecified: Secondary | ICD-10-CM

## 2024-08-14 DIAGNOSIS — B360 Pityriasis versicolor: Secondary | ICD-10-CM

## 2024-08-14 DIAGNOSIS — L578 Other skin changes due to chronic exposure to nonionizing radiation: Secondary | ICD-10-CM

## 2024-08-14 DIAGNOSIS — L814 Other melanin hyperpigmentation: Secondary | ICD-10-CM

## 2024-08-14 DIAGNOSIS — W908XXA Exposure to other nonionizing radiation, initial encounter: Secondary | ICD-10-CM | POA: Diagnosis not present

## 2024-08-14 DIAGNOSIS — L821 Other seborrheic keratosis: Secondary | ICD-10-CM

## 2024-08-14 DIAGNOSIS — D1801 Hemangioma of skin and subcutaneous tissue: Secondary | ICD-10-CM

## 2024-08-14 DIAGNOSIS — Z1283 Encounter for screening for malignant neoplasm of skin: Secondary | ICD-10-CM | POA: Diagnosis not present

## 2024-08-14 MED ORDER — KETOCONAZOLE 2 % EX CREA: 1.0000 | TOPICAL_CREAM | Freq: Two times a day (BID) | CUTANEOUS | 0 refills | Status: AC

## 2024-08-14 NOTE — Patient Instructions (Signed)

## 2024-08-14 NOTE — Progress Notes (Signed)
   New Patient Visit   Subjective  Debbie Griffin is a 43 y.o. female NEW PATIENT who presents for the following: total body skin exam.   Concerns: rash in groin that OTC hydrocortisone is not helping, rash on feet and a mole on her back   The following portions of the chart were reviewed this encounter and updated as appropriate: medications, allergies, medical history  Review of Systems:  No other skin or systemic complaints except as noted in HPI or Assessment and Plan.  Objective  Well appearing patient in no apparent distress; mood and affect are within normal limits.  A full examination was performed including scalp, head, eyes, ears, nose, lips, neck, chest, axillae, abdomen, back, buttocks, bilateral upper extremities, bilateral lower extremities, hands, feet, fingers, toes, fingernails, and toenails. All findings within normal limits unless otherwise noted below.    Relevant exam findings are noted in the Assessment and Plan.    Assessment & Plan   LENTIGINES, SEBORRHEIC KERATOSES, CHERRY ANGIOMAS- Benign normal skin lesions - Benign-appearing - Call for any changes  MELANOCYTIC NEVI - Tan-brown and/or pink-flesh-colored symmetric macules and papules - Benign appearing on exam today - Observation - Call clinic for new or changing moles - Recommend daily use of broad spectrum spf 30+ sunscreen to sun-exposed areas.   ACTINIC DAMAGE - Chronic condition, secondary to cumulative UV/sun exposure - diffuse scaly erythematous macules with underlying dyspigmentation - Recommend daily broad spectrum sunscreen SPF 30+ to sun-exposed areas, reapply every 2 hours as needed.  - Staying in the shade or wearing long sleeves, sun glasses (UVA+UVB protection) and wide brim hats (4-inch brim around the entire circumference of the hat) are also recommended for sun protection.  - Call for new or changing lesions.  SKIN CANCER SCREENING PERFORMED TODAY  TINEA VERSICOLOR -  FLARED    apply Ketoconazole  cream twice a day as needed until clear. Advised it can take many months for affected areas to repigment.   Tinea versicolor is a chronic recurrent skin rash causing discolored scaly spots most commonly seen on back, chest, and/or shoulders.  It is generally asymptomatic. The rash is due to overgrowth of a common type of yeast present on everyone's skin and it is not contagious.  It tends to flare more in the summer due to increased sweating on trunk.  After rash is treated, the scaliness will resolve, but the discoloration will take longer to return to normal pigmentation. The periodic use of an OTC medicated soap/shampoo with zinc or selenium sulfide can be helpful to prevent yeast overgrowth and recurrence.     TINEA VERSICOLOR   Related Medications ketoconazole  (NIZORAL ) 2 % cream Apply 1 Application topically 2 (two) times daily. MULTIPLE BENIGN NEVI   ACTINIC SKIN DAMAGE   SCREENING EXAM FOR SKIN CANCER   SEBORRHEIC KERATOSIS   CHERRY ANGIOMA    Return in about 2 years (around 08/14/2026) for TBSE follow up.  I, Doyce Pan, CMA, am acting as scribe for Gwynn Crossley K, PA-C.   Documentation: I have reviewed the above documentation for accuracy and completeness, and I agree with the above.  Ardis Lawley K, PA-C

## 2024-11-12 ENCOUNTER — Emergency Department (HOSPITAL_COMMUNITY)
Admission: EM | Admit: 2024-11-12 | Discharge: 2024-11-12 | Disposition: A | Discharge status: Home / Self Care | Payer: MEDICAID | Attending: Emergency Medicine | Admitting: Emergency Medicine

## 2024-11-12 ENCOUNTER — Other Ambulatory Visit: Payer: Self-pay

## 2024-11-12 ENCOUNTER — Encounter (HOSPITAL_COMMUNITY): Payer: Self-pay

## 2024-11-12 DIAGNOSIS — T40601A Poisoning by unspecified narcotics, accidental (unintentional), initial encounter: Secondary | ICD-10-CM

## 2024-11-12 LAB — CBC WITH DIFFERENTIAL/PLATELET
Abs Immature Granulocytes: 0.03 K/uL (ref 0.00–0.07)
Basophils Absolute: 0 K/uL (ref 0.0–0.1)
Basophils Relative: 0 %
Eosinophils Absolute: 0 K/uL (ref 0.0–0.5)
Eosinophils Relative: 0 %
HCT: 42.7 % (ref 36.0–46.0)
Hemoglobin: 14.5 g/dL (ref 12.0–15.0)
Immature Granulocytes: 0 %
Lymphocytes Relative: 22 %
Lymphs Abs: 1.5 K/uL (ref 0.7–4.0)
MCH: 30.5 pg (ref 26.0–34.0)
MCHC: 34 g/dL (ref 30.0–36.0)
MCV: 89.7 fL (ref 80.0–100.0)
Monocytes Absolute: 0.3 K/uL (ref 0.1–1.0)
Monocytes Relative: 4 %
Neutro Abs: 5 K/uL (ref 1.7–7.7)
Neutrophils Relative %: 74 %
Platelets: 177 K/uL (ref 150–400)
RBC: 4.76 MIL/uL (ref 3.87–5.11)
RDW: 12 % (ref 11.5–15.5)
WBC: 6.9 K/uL (ref 4.0–10.5)
nRBC: 0 % (ref 0.0–0.2)

## 2024-11-12 LAB — COMPREHENSIVE METABOLIC PANEL WITH GFR
ALT: 14 U/L (ref 0–44)
AST: 21 U/L (ref 15–41)
Albumin: 5 g/dL (ref 3.5–5.0)
Alkaline Phosphatase: 52 U/L (ref 38–126)
Anion gap: 14 (ref 5–15)
BUN: 15 mg/dL (ref 6–20)
CO2: 23 mmol/L (ref 22–32)
Calcium: 9.3 mg/dL (ref 8.9–10.3)
Chloride: 103 mmol/L (ref 98–111)
Creatinine, Ser: 1.02 mg/dL — ABNORMAL HIGH (ref 0.44–1.00)
GFR, Estimated: >60 mL/min (ref >60)
Glucose, Bld: 92 mg/dL (ref 70–99)
Potassium: 4.2 mmol/L (ref 3.5–5.1)
Sodium: 140 mmol/L (ref 135–145)
Total Bilirubin: 1.4 mg/dL — ABNORMAL HIGH (ref 0.0–1.2)
Total Protein: 7.8 g/dL (ref 6.5–8.1)

## 2024-11-12 LAB — TROPONIN T, HIGH SENSITIVITY
Troponin T High Sensitivity: <15 ng/L (ref 0–19)
Troponin T High Sensitivity: <15 ng/L (ref 0–19)

## 2024-11-12 MED ORDER — SODIUM CHLORIDE 0.9 % IV BOLUS
1000.0000 mL | Freq: Once | INTRAVENOUS | Status: AC
  Administered 2024-11-12: 1000 mL via INTRAVENOUS

## 2024-11-12 MED ORDER — NALOXONE HCL 0.4 MG/ML IJ SOLN
0.4000 mg | Freq: Once | INTRAMUSCULAR | Status: AC
  Administered 2024-11-12: 0.4 mg via INTRAVENOUS
  Filled 2024-11-12: qty 1

## 2024-11-12 NOTE — ED Notes (Signed)
 Pt ambulatory to bathroom with steady gait, pt boyfriend is with her

## 2024-11-12 NOTE — ED Triage Notes (Signed)
 Pt BIB [employer]. Pt was asleep on side of road. Pt states she took an old prescription of suboxone and feels like it put her into withdrawal. Pt is slurring words, dazed, dizzy, nauseated and unsteady on feet.

## 2024-11-12 NOTE — ED Notes (Signed)
 Pt saying she wants to go home- Dr Suzette made aware.

## 2024-11-12 NOTE — ED Notes (Signed)
Pt given [place] ice per request

## 2024-11-12 NOTE — Discharge Instructions (Signed)
 Make sure you tell your doctors tomorrow about what happened when you use the Suboxone today.  I would only use what they prescribed and you should definitely take less than what you took last night

## 2024-11-13 NOTE — ED Provider Notes (Signed)
 [place] EMERGENCY DEPARTMENT AT [facility] Provider Note   CSN: [id number] Arrival date & time: 11/12/24  1824     Patient presents with: Hypotension   Debbie Griffin is a 43 y.o. female.   Patient was sleeping in her car when [facility] came by to see her.  She seemed very lethargic and stated she was taking some of her old prescription of Suboxone  The history is provided by the patient and medical records. No language interpreter was used.  Weakness Severity:  Moderate Onset quality:  Sudden Timing:  Constant Progression:  Unchanged Chronicity:  Recurrent Context: not alcohol use   Relieved by:  Nothing Worsened by:  Nothing Ineffective treatments:  None tried Associated symptoms: no abdominal pain, no chest pain, no cough, no diarrhea, no frequency, no headaches and no seizures        Prior to Admission medications   Medication Sig Start Date End Date Taking? Authorizing Provider  amphetamine-dextroamphetamine (ADDERALL) 10 MG tablet Take 10 mg by mouth as needed (focus). 11/02/24  Yes [provider]  busPIRone (BUSPAR) 15 MG tablet Take 15 mg by mouth 3 (three) times daily. 09/17/24  Yes [provider]  clonazePAM (KLONOPIN) 0.5 MG tablet Take 0.5 mg by mouth 2 (two) times daily. 11/02/24  Yes [provider]  ibuprofen  (ADVIL ) 200 MG tablet Take 200 mg by mouth every 6 (six) hours as needed for mild pain (pain score 1-3).   Yes [provider]  lidocaine  (LIDODERM ) 5 % Place 1 patch onto the skin daily. Remove & Discard patch within 12 hours or as directed by MD 01/16/24  Yes Donnajean Lynwood DEL, PA-C  trazodone  (DESYREL ) 300 MG tablet Take 150 mg by mouth at bedtime.   Yes [provider]  Buprenorphine HCl-Naloxone  HCl (SUBOXONE SL) Place 1 Film under the tongue in the morning and at bedtime.    [provider]    Allergies: Patient has no known allergies.    Review of Systems   Constitutional:  Negative for appetite change and fatigue.  HENT:  Negative for congestion, ear discharge and sinus pressure.   Eyes:  Negative for discharge.  Respiratory:  Negative for cough.   Cardiovascular:  Negative for chest pain.  Gastrointestinal:  Negative for abdominal pain and diarrhea.  Genitourinary:  Negative for frequency and hematuria.  Musculoskeletal:  Negative for back pain.  Skin:  Negative for rash.  Neurological:  Positive for weakness. Negative for seizures and headaches.  Psychiatric/Behavioral:  Negative for hallucinations.     Updated Vital Signs BP 106/69   Pulse 70   Temp 98 F (36.7 C) (Oral)   Resp 16   SpO2 99%   Physical Exam Vitals and nursing note reviewed.  Constitutional:      Appearance: She is well-developed.     Comments: Lethargic  HENT:     Head: Normocephalic.     Nose: Nose normal.  Eyes:     General: No scleral icterus.    Conjunctiva/sclera: Conjunctivae normal.  Neck:     Thyroid : No thyromegaly.  Cardiovascular:     Rate and Rhythm: Normal rate and regular rhythm.     Heart sounds: No murmur heard.    No friction rub. No gallop.  Pulmonary:     Breath sounds: No stridor. No wheezing or rales.  Chest:     Chest wall: No tenderness.  Abdominal:     General: There is no distension.     Tenderness:  There is no abdominal tenderness. There is no rebound.  Musculoskeletal:        General: Normal range of motion.     Cervical back: Neck supple.  Lymphadenopathy:     Cervical: No cervical adenopathy.  Skin:    Findings: No erythema or rash.  Neurological:     Mental Status: She is oriented to person, place, and time.     Motor: No abnormal muscle tone.     Coordination: Coordination normal.  Psychiatric:        Behavior: Behavior normal.     (all labs ordered are listed, but only abnormal results are displayed) Labs Reviewed  COMPREHENSIVE METABOLIC PANEL WITH GFR - Abnormal; Notable for the following components:       Result Value   Creatinine, Ser 1.02 (*)    Total Bilirubin 1.4 (*)    All other components within normal limits  CBC WITH DIFFERENTIAL/PLATELET  TROPONIN T, HIGH SENSITIVITY  TROPONIN T, HIGH SENSITIVITY    EKG: None  Radiology: No results found.   Procedures   Medications Ordered in the ED  sodium chloride  0.9 % bolus 1,000 mL (0 mLs Intravenous Stopped 11/12/24 2119)  sodium chloride  0.9 % bolus 1,000 mL (0 mLs Intravenous Stopped 11/12/24 2307)  naloxone  (NARCAN ) injection 0.4 mg (0.4 mg Intravenous Given 11/12/24 2131)     CRITICAL CARE Performed by: Fairy Sermon Total critical care time: 45 minutes Critical care time was exclusive of separately billable procedures and treating other patients. Critical care was necessary to treat or prevent imminent or life-threatening deterioration. Critical care was time spent personally by me on the following activities: development of treatment plan with patient and/or surrogate as well as nursing, discussions with consultants, evaluation of patient's response to treatment, examination of patient, obtaining history from patient or surrogate, ordering and performing treatments and interventions, ordering and review of laboratory studies, ordering and review of radiographic studies, pulse oximetry and re-evaluation of patient's condition.                                 Patient was hypotensive and lethargic.  She initially responded to IV fluids but then had to be given some Narcan .  Eventually she was alert and oriented x 4 and after hydration her blood pressure remained normal Medical Decision Making Amount and/or Complexity of Data Reviewed Labs: ordered.  Risk Prescription drug management.   Narcotic overdose with hypotension and lethargy.  Patient improved in the ED and will be discharged home and told not to take her Suboxone anymore.  She is to follow-up with the doctor tomorrow     Final diagnoses:  Narcotic overdose,  accidental or unintentional, initial encounter [facility])    ED Discharge Orders     None          Sermon Fairy, MD 11/13/24 1216

## 2024-11-28 ENCOUNTER — Emergency Department (HOSPITAL_BASED_OUTPATIENT_CLINIC_OR_DEPARTMENT_OTHER): Payer: MEDICAID

## 2024-11-28 ENCOUNTER — Encounter (HOSPITAL_BASED_OUTPATIENT_CLINIC_OR_DEPARTMENT_OTHER): Payer: Self-pay

## 2024-11-28 ENCOUNTER — Inpatient Hospital Stay (HOSPITAL_BASED_OUTPATIENT_CLINIC_OR_DEPARTMENT_OTHER)
Admission: EM | Admit: 2024-11-28 | Discharge: 2024-11-29 | DRG: 330 | Disposition: A | Discharge status: Home / Self Care | Payer: MEDICAID | Attending: General Surgery | Admitting: General Surgery

## 2024-11-28 ENCOUNTER — Encounter (HOSPITAL_COMMUNITY): Admission: EM | Disposition: A | Payer: Self-pay | Source: Home / Self Care

## 2024-11-28 ENCOUNTER — Other Ambulatory Visit: Payer: Self-pay

## 2024-11-28 DIAGNOSIS — F17210 Nicotine dependence, cigarettes, uncomplicated: Secondary | ICD-10-CM | POA: Diagnosis present

## 2024-11-28 DIAGNOSIS — K920 Hematemesis: Secondary | ICD-10-CM | POA: Diagnosis present

## 2024-11-28 DIAGNOSIS — Z8349 Family history of other endocrine, nutritional and metabolic diseases: Secondary | ICD-10-CM

## 2024-11-28 DIAGNOSIS — K561 Intussusception: Principal | ICD-10-CM | POA: Diagnosis present

## 2024-11-28 DIAGNOSIS — Z79899 Other long term (current) drug therapy: Secondary | ICD-10-CM

## 2024-11-28 DIAGNOSIS — D175 Benign lipomatous neoplasm of intra-abdominal organs: Secondary | ICD-10-CM | POA: Diagnosis present

## 2024-11-28 DIAGNOSIS — R17 Unspecified jaundice: Secondary | ICD-10-CM | POA: Diagnosis present

## 2024-11-28 DIAGNOSIS — Z8711 Personal history of peptic ulcer disease: Secondary | ICD-10-CM

## 2024-11-28 HISTORY — PX: LAPAROSCOPY: SHX197

## 2024-11-28 HISTORY — PX: BOWEL RESECTION: SHX1257

## 2024-11-28 HISTORY — PX: LAPAROTOMY: SHX154

## 2024-11-28 LAB — COMPREHENSIVE METABOLIC PANEL WITH GFR
ALT: 12 U/L (ref 0–44)
AST: 22 U/L (ref 15–41)
Albumin: 4.7 g/dL (ref 3.5–5.0)
Alkaline Phosphatase: 49 U/L (ref 38–126)
Anion gap: 17 — ABNORMAL HIGH (ref 5–15)
BUN: 16 mg/dL (ref 6–20)
CO2: 20 mmol/L — ABNORMAL LOW (ref 22–32)
Calcium: 9.2 mg/dL (ref 8.9–10.3)
Chloride: 100 mmol/L (ref 98–111)
Creatinine, Ser: 0.92 mg/dL (ref 0.44–1.00)
GFR, Estimated: >60 mL/min
Glucose, Bld: 103 mg/dL — ABNORMAL HIGH (ref 70–99)
Potassium: 4.3 mmol/L (ref 3.5–5.1)
Sodium: 137 mmol/L (ref 135–145)
Total Bilirubin: 2.4 mg/dL — ABNORMAL HIGH (ref 0.0–1.2)
Total Protein: 7.3 g/dL (ref 6.5–8.1)

## 2024-11-28 LAB — URINALYSIS, ROUTINE W REFLEX MICROSCOPIC
Bacteria, UA: NONE SEEN
Bilirubin Urine: NEGATIVE
Glucose, UA: NEGATIVE mg/dL
Ketones, ur: >80 mg/dL — AB
Leukocytes,Ua: NEGATIVE
Nitrite: NEGATIVE
Protein, ur: 30 mg/dL — AB
RBC / HPF: >50 RBC/hpf (ref 0–5)
Specific Gravity, Urine: 1.031 — ABNORMAL HIGH (ref 1.005–1.030)
pH: 5.5 (ref 5.0–8.0)

## 2024-11-28 LAB — CBC WITH DIFFERENTIAL/PLATELET
Abs Immature Granulocytes: 0.03 K/uL (ref 0.00–0.07)
Basophils Absolute: 0 K/uL (ref 0.0–0.1)
Basophils Relative: 0 %
Eosinophils Absolute: 0 K/uL (ref 0.0–0.5)
Eosinophils Relative: 0 %
HCT: 39.5 % (ref 36.0–46.0)
Hemoglobin: 13.9 g/dL (ref 12.0–15.0)
Immature Granulocytes: 0 %
Lymphocytes Relative: 8 %
Lymphs Abs: 0.7 K/uL (ref 0.7–4.0)
MCH: 30.2 pg (ref 26.0–34.0)
MCHC: 35.2 g/dL (ref 30.0–36.0)
MCV: 85.7 fL (ref 80.0–100.0)
Monocytes Absolute: 0.2 K/uL (ref 0.1–1.0)
Monocytes Relative: 2 %
Neutro Abs: 8 K/uL — ABNORMAL HIGH (ref 1.7–7.7)
Neutrophils Relative %: 90 %
Platelets: 251 K/uL (ref 150–400)
RBC: 4.61 MIL/uL (ref 3.87–5.11)
RDW: 11.4 % — ABNORMAL LOW (ref 11.5–15.5)
WBC: 8.9 K/uL (ref 4.0–10.5)
nRBC: 0 % (ref 0.0–0.2)

## 2024-11-28 LAB — URINE DRUG SCREEN
Amphetamines: POSITIVE — AB
Barbiturates: NEGATIVE
Benzodiazepines: POSITIVE — AB
Cocaine: NEGATIVE
Fentanyl: POSITIVE — AB
Methadone Scn, Ur: NEGATIVE
Opiates: NEGATIVE
Tetrahydrocannabinol: POSITIVE — AB

## 2024-11-28 LAB — HCG, SERUM, QUALITATIVE: Preg, Serum: NEGATIVE

## 2024-11-28 LAB — PROTIME-INR
INR: 1 (ref 0.8–1.2)
Prothrombin Time: 13.8 s (ref 11.4–15.2)

## 2024-11-28 LAB — LIPASE, BLOOD: Lipase: 12 U/L (ref 11–51)

## 2024-11-28 SURGERY — LAPAROSCOPY, DIAGNOSTIC
Anesthesia: General | Site: Abdomen

## 2024-11-28 MED ORDER — DROPERIDOL 2.5 MG/ML IJ SOLN
2.5000 mg | Freq: Once | INTRAMUSCULAR | Status: AC
  Administered 2024-11-28: 2.5 mg via INTRAVENOUS
  Filled 2024-11-28: qty 2

## 2024-11-28 MED ORDER — LACTATED RINGERS IV BOLUS
1000.0000 mL | Freq: Once | INTRAVENOUS | Status: AC
  Administered 2024-11-28: 1000 mL via INTRAVENOUS

## 2024-11-28 MED ORDER — ONDANSETRON HCL 4 MG/2ML IJ SOLN
4.0000 mg | Freq: Once | INTRAMUSCULAR | Status: AC
  Administered 2024-11-28: 4 mg via INTRAVENOUS
  Filled 2024-11-28: qty 2

## 2024-11-28 MED ORDER — PANTOPRAZOLE SODIUM 40 MG IV SOLR
40.0000 mg | Freq: Once | INTRAVENOUS | Status: AC
  Administered 2024-11-28: 40 mg via INTRAVENOUS
  Filled 2024-11-28: qty 10

## 2024-11-28 MED ORDER — IOHEXOL 300 MG/ML  SOLN
100.0000 mL | Freq: Once | INTRAMUSCULAR | Status: AC | PRN
  Administered 2024-11-28: 100 mL via INTRAVENOUS

## 2024-11-28 SURGICAL SUPPLY — 40 items
BAG COUNTER SPONGE SURGICOUNT (BAG) ×2 IMPLANT
BLADE CLIPPER SURG (BLADE) IMPLANT
CANISTER SUCTION 3000ML PPV (SUCTIONS) ×2 IMPLANT
CHLORAPREP W/TINT 26 (MISCELLANEOUS) ×2 IMPLANT
COVER SURGICAL LIGHT HANDLE (MISCELLANEOUS) ×2 IMPLANT
DERMABOND ADVANCED .7 DNX12 (GAUZE/BANDAGES/DRESSINGS) ×2 IMPLANT
DRAPE WARM FLUID 44X44 (DRAPES) IMPLANT
ELECTRODE REM PT RTRN 9FT ADLT (ELECTROSURGICAL) ×2 IMPLANT
GLOVE BIOGEL PI MICRO STRL 6 (GLOVE) ×2 IMPLANT
GLOVE INDICATOR 6.5 STRL GRN (GLOVE) ×2 IMPLANT
GOWN STRL REUS W/ TWL LRG LVL3 (GOWN DISPOSABLE) ×6 IMPLANT
IRRIGATION SUCT STRKRFLW 2 WTP (MISCELLANEOUS) IMPLANT
KIT BASIN OR (CUSTOM PROCEDURE TRAY) ×2 IMPLANT
KIT TURNOVER KIT B (KITS) ×2 IMPLANT
LIGASURE IMPACT 36 18CM CVD LR (INSTRUMENTS) IMPLANT
NDL 22X1.5 STRL (OR ONLY) (MISCELLANEOUS) ×2 IMPLANT
NEEDLE 22X1.5 STRL (OR ONLY) (MISCELLANEOUS) ×2 IMPLANT
PAD ARMBOARD POSITIONER FOAM (MISCELLANEOUS) ×4 IMPLANT
PENCIL BUTTON HOLSTER BLD 10FT (ELECTRODE) IMPLANT
RELOAD STAPLE 75 3.8 BLU REG (ENDOMECHANICALS) IMPLANT
SCISSORS LAP 5X35 DISP (ENDOMECHANICALS) IMPLANT
SET TUBE SMOKE EVAC HIGH FLOW (TUBING) ×2 IMPLANT
SLEEVE Z-THREAD 5X100MM (TROCAR) ×2 IMPLANT
SOLN 0.9% NACL POUR BTL 1000ML (IV SOLUTION) ×2 IMPLANT
SPIKE FLUID TRANSFER (MISCELLANEOUS) ×4 IMPLANT
SPONGE T-LAP 18X18 ~~LOC~~+RFID (SPONGE) IMPLANT
STAPLER PROXIMATE 75MM BLUE (STAPLE) IMPLANT
SUT MNCRL AB 4-0 PS2 18 (SUTURE) ×2 IMPLANT
SUT PDS AB 0 CT 36 (SUTURE) IMPLANT
SUT PDS AB 2-0 CT2 27 (SUTURE) IMPLANT
SUT VIC AB 3-0 SH 18 (SUTURE) IMPLANT
TOWEL GREEN STERILE (TOWEL DISPOSABLE) ×2 IMPLANT
TOWEL GREEN STERILE FF (TOWEL DISPOSABLE) ×2 IMPLANT
TRAY FOLEY W/BAG SLVR 14FR (SET/KITS/TRAYS/PACK) IMPLANT
TRAY LAPAROSCOPIC MC (CUSTOM PROCEDURE TRAY) ×2 IMPLANT
TROCAR 11X100 Z THREAD (TROCAR) IMPLANT
TROCAR BALLN 12MMX100 BLUNT (TROCAR) IMPLANT
TROCAR Z-THREAD OPTICAL 5X100M (TROCAR) ×2 IMPLANT
TUBE CONNECTING 12X1/4 (SUCTIONS) IMPLANT
WARMER LAPAROSCOPE (MISCELLANEOUS) ×2 IMPLANT

## 2024-11-28 NOTE — Anesthesia Preprocedure Evaluation (Addendum)
"                                    Anesthesia Evaluation  Patient identified by MRN, date of birth, ID band Patient awake    Reviewed: Allergy & Precautions, NPO status , Patient's Chart, lab work & pertinent test results  History of Anesthesia Complications Negative for: history of anesthetic complications  Airway Mallampati: I  TM Distance: >3 FB Neck ROM: Full    Dental  (+) Dental Advisory Given, Missing   Pulmonary Current SmokerPatient did not abstain from smoking.   breath sounds clear to auscultation       Cardiovascular (-) angina  Rhythm:Regular Rate:Normal  '24 ECHO: EF 55 to 60%. 1. The LV has normal function, no regional wall motion abnormalities.   2. RVF is normal. The right ventricular size is normal. There is normal pulmonary artery systolic pressure.   3. No evidence of mitral valve regurgitation.   4. The aortic valve is tricuspid. Aortic valve regurgitation is not visualized.     Neuro/Psych  Headaches PSYCHIATRIC DISORDERS (ADHD) Anxiety        GI/Hepatic ,GERD  Poorly Controlled,,(+)     substance abuse (suboxone)  marijuana useSBO with intussusception N/V    Endo/Other  negative endocrine ROS    Renal/GU negative Renal ROS     Musculoskeletal  (+)  narcotic dependent  Abdominal   Peds  Hematology Hb 13.9, plt 251k   Anesthesia Other Findings   Reproductive/Obstetrics                              Anesthesia Physical Anesthesia Plan  ASA: 2 and emergent  Anesthesia Plan: General   Post-op Pain Management: Ofirmev  IV (intra-op)*   Induction: Intravenous and Rapid sequence  PONV Risk Score and Plan: 2 and Ondansetron  and Dexamethasone   Airway Management Planned: Oral ETT  Additional Equipment: None  Intra-op Plan:   Post-operative Plan: Extubation in OR  Informed Consent: I have reviewed the patients History and Physical, chart, labs and discussed the procedure including the risks,  benefits and alternatives for the proposed anesthesia with the patient or authorized representative who has indicated his/her understanding and acceptance.     Dental advisory given  Plan Discussed with: CRNA and Surgeon  Anesthesia Plan Comments: (Pt understands pain may be difficult to control, given Suboxone use)         Anesthesia Quick Evaluation  "

## 2024-11-28 NOTE — ED Notes (Signed)
 Patient transported to CT

## 2024-11-28 NOTE — ED Provider Notes (Signed)
" °  Physical Exam  BP 113/83   Pulse 65   Temp 98.7 F (37.1 C) (Oral)   Resp 18   SpO2 100%   Physical Exam  Procedures  Procedures  ED Course / MDM    Medical Decision Making Amount and/or Complexity of Data Reviewed Labs: ordered. Radiology: ordered.  Risk Prescription drug management. Decision regarding hospitalization.   *** Care of the patient assumed from previous ED provider Dr. Armenta 1 the patient was received in transfer from [facility].  Please see her associated note for further insight into the patient's ED course.  In brief, patient is a 43 year old female presented with concern for multiple symptoms of vomiting with what she reported as hematemesis.,  Diffuse lower abdominal pain, with CT revealing intussusception.  Patient was scanned a second time at the request of general surgery Dr. Ann with confirmation of intussusception.  Plan is for the patient to go to the operating room, however due to OR delays patient was accepted in transfer from ED to ED.  Resting comfortably with symptom management at this time awaiting transport to the OR.    "

## 2024-11-28 NOTE — ED Notes (Signed)
 [facility] at bedside

## 2024-11-28 NOTE — ED Triage Notes (Signed)
 Patient arrives from [facility] intussusception. Patient reports pain 4/10 in the abdomen. Reports nausea.

## 2024-11-28 NOTE — ED Provider Notes (Signed)
 " [place] EMERGENCY DEPARTMENT AT [facility] Provider Note   CSN: [id number] Arrival date & time: 11/28/24  1454     Patient presents with: Hematemesis   Debbie Griffin is a 43 y.o. female.   HPI Patient reports pain started pretty quickly this morning at about 9 AM.  She reports she had a lot of pain in her lower abdomen.  Really sharp and intense.  She reports she started throwing up repeatedly after that.  She reports after vomiting a number of times she saw some blood in the emesis.  She reports she does have a history of an ulcer and it feels similar although at this time the pain is very low in her abdomen and previously had been more in her upper abdomen.  She reports she is also having a menstrual cycle right now but this does not feel like the pain associated with any menstrual cycle she has ever experienced.  He denies any history of problems with recurrent pain from ovarian cysts or other pelvic pain.  No recent diarrhea or constipation.    Prior to Admission medications  Medication Sig Start Date End Date Taking? Authorizing Provider  amphetamine-dextroamphetamine (ADDERALL) 10 MG tablet Take 10 mg by mouth as needed (focus). 11/02/24   [provider]  Buprenorphine HCl-Naloxone  HCl (SUBOXONE SL) Place 1 Film under the tongue in the morning and at bedtime.    [provider]  busPIRone  (BUSPAR ) 15 MG tablet Take 15 mg by mouth 3 (three) times daily. 09/17/24   [provider]  clonazePAM  (KLONOPIN ) 0.5 MG tablet Take 0.5 mg by mouth 2 (two) times daily. 11/02/24   [provider]  ibuprofen  (ADVIL ) 200 MG tablet Take 200 mg by mouth every 6 (six) hours as needed for mild pain (pain score 1-3).    [provider]  lidocaine  (LIDODERM ) 5 % Place 1 patch onto the skin daily. Remove & Discard patch within 12 hours or as directed by MD 01/16/24   Donnajean Lynwood DEL, PA-C  trazodone  (DESYREL ) 300 MG tablet Take 150 mg by mouth  at bedtime.    [provider]    Allergies: Patient has no known allergies.    Review of Systems  Updated Vital Signs BP 110/69   Pulse (!) 58   Temp 98.7 F (37.1 C) (Oral)   Resp 20   SpO2 100%   Physical Exam Constitutional:      Comments: Patient is alert.  Very uncomfortable in appearance.  She is having some dry heaves.  Nontoxic.  No respiratory distress.  HENT:     Mouth/Throat:     Pharynx: Oropharynx is clear.  Cardiovascular:     Rate and Rhythm: Normal rate and regular rhythm.  Pulmonary:     Effort: Pulmonary effort is normal.     Breath sounds: Normal breath sounds.  Abdominal:     Comments: Patient endorses abdominal pain lower and diffusely.  Nonfocal.  No guarding.  Skin:    General: Skin is warm and dry.  Neurological:     General: No focal deficit present.     Mental Status: She is oriented to person, place, and time.     Motor: No weakness.     Coordination: Coordination normal.  Psychiatric:        Mood and Affect: Mood normal.     (all labs ordered are listed, but only abnormal results are displayed) Labs Reviewed  CBC WITH DIFFERENTIAL/PLATELET - Abnormal; Notable for the  following components:      Result Value   RDW 11.4 (*)    Neutro Abs 8.0 (*)    All other components within normal limits  URINALYSIS, ROUTINE W REFLEX MICROSCOPIC - Abnormal; Notable for the following components:   Specific Gravity, Urine 1.031 (*)    Hgb urine dipstick LARGE (*)    Ketones, ur >80 (*)    Protein, ur 30 (*)    All other components within normal limits  URINE DRUG SCREEN - Abnormal; Notable for the following components:   Benzodiazepines POSITIVE (*)    Amphetamines POSITIVE (*)    Tetrahydrocannabinol POSITIVE (*)    Fentanyl  POSITIVE (*)    All other components within normal limits  COMPREHENSIVE METABOLIC PANEL WITH GFR - Abnormal; Notable for the following components:   CO2 20 (*)    Glucose, Bld 103 (*)    Total Bilirubin 2.4 (*)     Anion gap 17 (*)    All other components within normal limits  PROTIME-INR  HCG, SERUM, QUALITATIVE  LIPASE, BLOOD  OCCULT BLOOD X 1 CARD TO LAB, STOOL    EKG: None  Radiology: CT ABDOMEN PELVIS WO CONTRAST Result Date: 11/28/2024 EXAM: CT ABDOMEN AND PELVIS WITHOUT CONTRAST 11/28/2024 09:37:12 PM TECHNIQUE: CT of the abdomen and pelvis was performed without the administration of intravenous contrast. Multiplanar reformatted images are provided for review. Automated exposure control, iterative reconstruction, and/or weight-based adjustment of the mA/kV was utilized to reduce the radiation dose to as low as reasonably achievable. COMPARISON: Comparison with CT early or today. CLINICAL HISTORY: Abdominal pain, acute, nonlocalized; reeval for appaerance of intuss. pain now resolving. FINDINGS: LOWER CHEST: No acute abnormality. LIVER: The liver is unremarkable. GALLBLADDER AND BILE DUCTS: Gallbladder is unremarkable. No biliary ductal dilatation. SPLEEN: No acute abnormality. PANCREAS: No acute abnormality. ADRENAL GLANDS: No acute abnormality. KIDNEYS, URETERS AND BLADDER: Contrast from the CT early or today is present in the renal collecting systems and bladder. No stones in the kidneys or ureters. No hydronephrosis. No perinephric or periureteral stranding. GI AND BOWEL: Stomach demonstrates no acute abnormality. Moderate colonic stool burden suggesting constipation. Normal appendix. Left lower quadrant small bowel intussusception is redemonstrated with fat density mass likely acting as the lead point (series 2, image 44-53). The presumed lipoma measures 3.3 x 1.4 cm. No evidence of obstruction. PERITONEUM AND RETROPERITONEUM: No ascites. No free air. VASCULATURE: Aorta is normal in caliber. LYMPH NODES: No lymphadenopathy. REPRODUCTIVE ORGANS: No acute abnormality. BONES AND SOFT TISSUES: No acute osseous abnormality. No focal soft tissue abnormality. IMPRESSION: 1. Left lower quadrant small bowel  intussusception with a 3.3 x 1.4 cm fat density mass, likely a lipoma, acting as the lead point. No evidence of obstruction. 2. Moderate colonic stool burden suggesting constipation. Electronically signed by: Norman Gatlin MD 11/28/2024 09:47 PM EST RP Workstation: [id number]   CT CHEST ABDOMEN PELVIS W CONTRAST Result Date: 11/28/2024 CLINICAL DATA:  Hematemesis abdominal pain EXAM: CT CHEST, ABDOMEN, AND PELVIS WITH CONTRAST TECHNIQUE: Multidetector CT imaging of the chest, abdomen and pelvis was performed following the standard protocol during bolus administration of intravenous contrast. RADIATION DOSE REDUCTION: This exam was performed according to the departmental dose-optimization program which includes automated exposure control, adjustment of the mA and/or kV according to patient size and/or use of iterative reconstruction technique. CONTRAST:  OMNIPAQUE  IOHEXOL  300 MG/ML  SOLN COMPARISON:  CT 09/26/2023, 02/26/2023 FINDINGS: CT CHEST FINDINGS Cardiovascular: Nonaneurysmal aorta. Normal cardiac size. No pericardial effusion Mediastinum/Nodes: No enlarged mediastinal,  hilar, or axillary lymph nodes. Thyroid  gland, trachea, and esophagus demonstrate no significant findings. Lungs/Pleura: No acute airspace disease, pleural effusion, or pneumothorax. Stable triangular shaped right lower lobe pulmonary nodule measuring 5 mm abutting the fissure, series 3, image 94, no specific imaging follow-up recommended. Musculoskeletal: Sternum is intact.  No acute osseous abnormality CT ABDOMEN PELVIS FINDINGS Hepatobiliary: Distended gallbladder. No calcified stone or biliary dilatation Pancreas: Unremarkable. No pancreatic ductal dilatation or surrounding inflammatory changes. Spleen: Normal in size without focal abnormality. Adrenals/Urinary Tract: Adrenal glands are unremarkable. Kidneys are normal, without renal calculi, focal lesion, or hydronephrosis. Bladder is unremarkable. Stomach/Bowel: Stomach within  normal limits. Left lower quadrant small bowel intussusception, series 2 image 90 through 95. fat density mass in the left lower quadrant measuring 2.7 cm suspected to represent a small bowel lipoma, potential lead point. No definite bowel obstruction on this exam. No definite bowel wall thickening. Negative appendix Vascular/Lymphatic: No significant vascular findings are present. No enlarged abdominal or pelvic lymph nodes. Reproductive: Uterus and bilateral adnexa are unremarkable. Other: No ascites or free air Musculoskeletal: No acute or suspicious osseous abnormality IMPRESSION: 1. No CT evidence for acute intrathoracic abnormality. 2. Left lower quadrant small bowel intussusception, this appears to be related to a fat density mass in the left lower quadrant, suspect for small bowel lipoma. No definitive signs for obstruction on the current exam but given intussusception with probable lead point, recommend surgical consultation 3. Distended gallbladder without calcified stone or biliary dilatation. Electronically Signed   By: Luke Bun M.D.   On: 11/28/2024 19:30     Procedures   Medications Ordered in the ED  pantoprazole  (PROTONIX ) injection 40 mg (40 mg Intravenous Given 11/28/24 1613)  lactated ringers  bolus 1,000 mL (0 mLs Intravenous Stopped 11/28/24 1851)  droperidol  (INAPSINE ) 2.5 MG/ML injection 2.5 mg (2.5 mg Intravenous Given 11/28/24 1613)  lactated ringers  bolus 1,000 mL (0 mLs Intravenous Stopped 11/28/24 2130)  iohexol  (OMNIPAQUE ) 300 MG/ML solution 100 mL (100 mLs Intravenous Contrast Given 11/28/24 1855)  ondansetron  (ZOFRAN ) injection 4 mg (4 mg Intravenous Given 11/28/24 2000)                                    Medical Decision Making Amount and/or Complexity of Data Reviewed Labs: ordered. Radiology: ordered.  Risk Prescription drug management. Decision regarding hospitalization.   Patient presents as outlined with abdominal pain and recurrent vomiting.   Abdominal pain is lower and fairly diffuse.  Will proceed with broad diagnostic evaluation.  Differential diagnosis includes kidney stone\ovarian cyst or torsion\bowel obstruction\diverticulitis\pelvic infection\cannabinol aid hyperemesis\Boerhaave's esophagitis\peptic ulcer.  Will proceed with broad diagnostic evaluation and start volume resuscitation.  With lactated Ringer 's, droperidol  for nausea and vomiting and Protonix  for possible upper GI bleed.  Patient got significant improvement after droperidol  and fluids. Metabolic panel GFR greater than 60.  Total bili 2.4 LFTs otherwise normal.  CBC normal urinalysis positive for blood in the setting of menstrual cycle greater than 80 ketones pregnancy negative UDS pan positive.  CT imaging interpreted by radiology area of intussusception and small bowel with a possible lipoma.  19: 40 with reassessment patient appears much improved.  Vomiting has stopped but she reports she is having some nausea returning and would like some more nausea medicine.  Pain has improved.  Abdominal exam at this time is soft without guarding.  Will consult general surgery regarding CT finding of intussusception.  Consult: Reviewed  with Dr. Richerd Silversmith.  Recommends repeat CT scan.  Repeat scan reconfirms intussusception.  Dr. Silversmith will take to the OR.  Patient is much more comfortable after treatment.  She reports Zofran  helped a lot with nausea.  Has some discomfort but mild at this time.      Final diagnoses:  Intussusception [facility])    ED Discharge Orders     None          Armenta Canning, MD 11/28/24 2213  "

## 2024-11-28 NOTE — H&P (Signed)
 "   HPI  Debbie Griffin is an 43 y.o. female who presented to [facility] ED today due to abdominal pain that started earlier this AM.   Patient had associated nausea and emesis and ended up having hematemesis. She does have history of ulcer but pain associated with that in the past was in her upper abdomen and the pain today is in her lower abdomen. Patient is also on her period but does not feel that pain is related to her menstrual cycle. No diarrhea or constipation. No fevers or chills.  Labs notable for elevated bilirubin to 2.4. Normal WBC.   CT scan was concerning for intussusception possible due to small bowel lipoma. However, pain and discomfort had resolved after CT scan per EDP. Requested repeat CT given pain resolution but intussusception still present. Patient subsequently transferred to [facility] for surgical intervention.  UDS positive for several substances--she has subscriptions for all except for THC.  She has history of laparoscopic assisted repair of ventral hernias with mesh by Dr. Tanda in February of this year.  10 point review of systems is negative except as listed above in HPI.  Objective  Past Medical History: Past Medical History:  Diagnosis Date   Adjustment disorder with mixed disturbance of emotions and conduct    Anxiety    Dislocation of the knee cap    left 2018   GERD (gastroesophageal reflux disease)    Headache    Hernia, umbilical    Miscarriage    Multiple gastric ulcers    Opiate abuse, continuous (HCC)    Ovarian cyst    Syncope     Past Surgical History: Past Surgical History:  Procedure Laterality Date   ANTERIOR CRUCIATE LIGAMENT REPAIR Left 01/12/2018   Procedure: LEFT KNEE ANTERIOR CRUCIATE LIGAMENT (ACL) RECONSTRUCTION, PARTIAL MEDIAL MENISCECTOMY;  Surgeon: Addie Cordella Hamilton, MD;  Location: [facility] OR;  Service: Orthopedics;  Laterality: Left;   ESOPHAGOGASTRODUODENOSCOPY     SUPRA-UMBILICAL HERNIA N/A 01/29/2024   Procedure: SUPRA-UMBILICAL HERNIA  WITH MESH;  Surgeon: Tanda Locus, MD;  Location: [facility] ORS;  Service: General;  Laterality: N/A;   UMBILICAL HERNIA REPAIR N/A 01/29/2024   Procedure: LAPAROSCOPIC UMBILICAL HERNIA WITH MESH;  Surgeon: Tanda Locus, MD;  Location: [facility] ORS;  Service: General;  Laterality: N/A;   WISDOM TOOTH EXTRACTION      Family History:  Family History  Problem Relation Age of Onset   Thyroid  disease Father    Other Neg Hx     Social History:  reports that she has been smoking cigarettes. She has never used smokeless tobacco. She reports current alcohol use. She reports current drug use. Drug: Marijuana.  Allergies: Allergies[1]  Medications: I have reviewed the patient's current medications.  Labs: I have personally reviewed all labs for the past 24h  Imaging: I have personally reviewed and interpreted all imaging for the past 24h and agree with the radiologist's impression.  CT ABDOMEN PELVIS WO CONTRAST Result Date: 11/28/2024 EXAM: CT ABDOMEN AND PELVIS WITHOUT CONTRAST 11/28/2024 09:37:12 PM TECHNIQUE: CT of the abdomen and pelvis was performed without the administration of intravenous contrast. Multiplanar reformatted images are provided for review. Automated exposure control, iterative reconstruction, and/or weight-based adjustment of the mA/kV was utilized to reduce the radiation dose to as low as reasonably achievable. COMPARISON: Comparison with CT early or today. CLINICAL HISTORY: Abdominal pain, acute, nonlocalized; reeval for appaerance of intuss. pain now resolving. FINDINGS: LOWER CHEST: No acute abnormality. LIVER: The liver is unremarkable. GALLBLADDER AND BILE DUCTS:  Gallbladder is unremarkable. No biliary ductal dilatation. SPLEEN: No acute abnormality. PANCREAS: No acute abnormality. ADRENAL GLANDS: No acute abnormality. KIDNEYS, URETERS AND BLADDER: Contrast from the CT early or today is present in the renal collecting systems and bladder. No stones in the kidneys or ureters. No  hydronephrosis. No perinephric or periureteral stranding. GI AND BOWEL: Stomach demonstrates no acute abnormality. Moderate colonic stool burden suggesting constipation. Normal appendix. Left lower quadrant small bowel intussusception is redemonstrated with fat density mass likely acting as the lead point (series 2, image 44-53). The presumed lipoma measures 3.3 x 1.4 cm. No evidence of obstruction. PERITONEUM AND RETROPERITONEUM: No ascites. No free air. VASCULATURE: Aorta is normal in caliber. LYMPH NODES: No lymphadenopathy. REPRODUCTIVE ORGANS: No acute abnormality. BONES AND SOFT TISSUES: No acute osseous abnormality. No focal soft tissue abnormality. IMPRESSION: 1. Left lower quadrant small bowel intussusception with a 3.3 x 1.4 cm fat density mass, likely a lipoma, acting as the lead point. No evidence of obstruction. 2. Moderate colonic stool burden suggesting constipation. Electronically signed by: Norman Gatlin MD 11/28/2024 09:47 PM EST RP Workstation: [id number]   CT CHEST ABDOMEN PELVIS W CONTRAST Result Date: 11/28/2024 CLINICAL DATA:  Hematemesis abdominal pain EXAM: CT CHEST, ABDOMEN, AND PELVIS WITH CONTRAST TECHNIQUE: Multidetector CT imaging of the chest, abdomen and pelvis was performed following the standard protocol during bolus administration of intravenous contrast. RADIATION DOSE REDUCTION: This exam was performed according to the departmental dose-optimization program which includes automated exposure control, adjustment of the mA and/or kV according to patient size and/or use of iterative reconstruction technique. CONTRAST:  OMNIPAQUE  IOHEXOL  300 MG/ML  SOLN COMPARISON:  CT 09/26/2023, 02/26/2023 FINDINGS: CT CHEST FINDINGS Cardiovascular: Nonaneurysmal aorta. Normal cardiac size. No pericardial effusion Mediastinum/Nodes: No enlarged mediastinal, hilar, or axillary lymph nodes. Thyroid  gland, trachea, and esophagus demonstrate no significant findings. Lungs/Pleura: No acute  airspace disease, pleural effusion, or pneumothorax. Stable triangular shaped right lower lobe pulmonary nodule measuring 5 mm abutting the fissure, series 3, image 94, no specific imaging follow-up recommended. Musculoskeletal: Sternum is intact.  No acute osseous abnormality CT ABDOMEN PELVIS FINDINGS Hepatobiliary: Distended gallbladder. No calcified stone or biliary dilatation Pancreas: Unremarkable. No pancreatic ductal dilatation or surrounding inflammatory changes. Spleen: Normal in size without focal abnormality. Adrenals/Urinary Tract: Adrenal glands are unremarkable. Kidneys are normal, without renal calculi, focal lesion, or hydronephrosis. Bladder is unremarkable. Stomach/Bowel: Stomach within normal limits. Left lower quadrant small bowel intussusception, series 2 image 90 through 95. fat density mass in the left lower quadrant measuring 2.7 cm suspected to represent a small bowel lipoma, potential lead point. No definite bowel obstruction on this exam. No definite bowel wall thickening. Negative appendix Vascular/Lymphatic: No significant vascular findings are present. No enlarged abdominal or pelvic lymph nodes. Reproductive: Uterus and bilateral adnexa are unremarkable. Other: No ascites or free air Musculoskeletal: No acute or suspicious osseous abnormality IMPRESSION: 1. No CT evidence for acute intrathoracic abnormality. 2. Left lower quadrant small bowel intussusception, this appears to be related to a fat density mass in the left lower quadrant, suspect for small bowel lipoma. No definitive signs for obstruction on the current exam but given intussusception with probable lead point, recommend surgical consultation 3. Distended gallbladder without calcified stone or biliary dilatation. Electronically Signed   By: Luke Bun M.D.   On: 11/28/2024 19:30     Physical Exam Blood pressure 110/69, pulse (!) 58, temperature 98.7 F (37.1 C), temperature source Oral, resp. rate 20, SpO2 100%,  unknown if currently  breastfeeding. General: no acute distress HEENT: normocephalic, atraumatic Oropharynx: mucous membranes moist CV: Regular rate and rhythm, normotensive Chest: equal chest rise bilaterally normal respiratory effort on room air Abdomen: soft, nondistended, and mildly tender diffusely Extremities: moves all extremities Skin: warm, dry, no rashes Psych: normal memory, normal mood/affect  Neuro: No focal neurologic deficits, A&Ox3    Assessment   Debbie Griffin is an 43 y.o. female with intussusception with lead point seeming to be at small bowel lipoma  Plan  - Proceed to OR for diagnostic laparoscopy, possible laparotomy, possible bowel resection - We discussed the risks of operation including but not limited to: bleeding, deep and superficial surgical site infections including abscess, injury to surrounding structures with need for repair, reoperation, possibility of negative laparoscopy, possibility of conversion to laparotomy not only for bowel resection but if unable to adequately visualize small bowel due to adhesions from prior mesh placement. All questions were answered. Patient voiced understanding and agreed to proceed with surgery.   I reviewed ED provider notes, last 24 h vitals and pain scores, last 48 h intake and output, last 24 h labs and trends, and last 24 h imaging results.  This care required high  level of medical decision making.    Orie Silversmith, MD General Surgery, Surgical Critical Care and Trauma        [1] No Known Allergies  "

## 2024-11-28 NOTE — ED Notes (Signed)
 ED Provider at bedside.

## 2024-11-28 NOTE — ED Notes (Addendum)
 First light green tube was hemolyzed. Re-draw sent to lab.

## 2024-11-28 NOTE — ED Notes (Signed)
 Pt. Started drinking the oral contrast at 2121

## 2024-11-28 NOTE — ED Triage Notes (Signed)
 Pt c/o throwing up, now I'm throwing up blood- nonstop for about 12hrs. Advises hx of ulcers, states it feels the same. States she took meds at home w no relief, denies antacid or NSAID use

## 2024-11-29 ENCOUNTER — Encounter (HOSPITAL_COMMUNITY): Payer: Self-pay | Admitting: Certified Registered Nurse Anesthetist

## 2024-11-29 ENCOUNTER — Emergency Department (HOSPITAL_COMMUNITY): Payer: MEDICAID | Admitting: Anesthesiology

## 2024-11-29 ENCOUNTER — Other Ambulatory Visit (HOSPITAL_COMMUNITY): Payer: Self-pay

## 2024-11-29 DIAGNOSIS — Z8711 Personal history of peptic ulcer disease: Secondary | ICD-10-CM | POA: Diagnosis not present

## 2024-11-29 DIAGNOSIS — K561 Intussusception: Secondary | ICD-10-CM

## 2024-11-29 DIAGNOSIS — Z8349 Family history of other endocrine, nutritional and metabolic diseases: Secondary | ICD-10-CM | POA: Diagnosis not present

## 2024-11-29 DIAGNOSIS — R17 Unspecified jaundice: Secondary | ICD-10-CM | POA: Diagnosis present

## 2024-11-29 DIAGNOSIS — F17210 Nicotine dependence, cigarettes, uncomplicated: Secondary | ICD-10-CM | POA: Diagnosis present

## 2024-11-29 DIAGNOSIS — D175 Benign lipomatous neoplasm of intra-abdominal organs: Secondary | ICD-10-CM | POA: Diagnosis present

## 2024-11-29 DIAGNOSIS — K920 Hematemesis: Secondary | ICD-10-CM | POA: Diagnosis present

## 2024-11-29 DIAGNOSIS — Z79899 Other long term (current) drug therapy: Secondary | ICD-10-CM | POA: Diagnosis not present

## 2024-11-29 LAB — BASIC METABOLIC PANEL WITH GFR
Anion gap: 11 (ref 5–15)
BUN: 9 mg/dL (ref 6–20)
CO2: 21 mmol/L — ABNORMAL LOW (ref 22–32)
Calcium: 8.5 mg/dL — ABNORMAL LOW (ref 8.9–10.3)
Chloride: 102 mmol/L (ref 98–111)
Creatinine, Ser: 0.81 mg/dL (ref 0.44–1.00)
GFR, Estimated: >60 mL/min
Glucose, Bld: 125 mg/dL — ABNORMAL HIGH (ref 70–99)
Potassium: 4.2 mmol/L (ref 3.5–5.1)
Sodium: 134 mmol/L — ABNORMAL LOW (ref 135–145)

## 2024-11-29 MED ORDER — LIDOCAINE 2% (20 MG/ML) 5 ML SYRINGE
INTRAMUSCULAR | Status: DC | PRN
  Administered 2024-11-29: 20 mg via INTRAVENOUS

## 2024-11-29 MED ORDER — BUPIVACAINE-EPINEPHRINE (PF) 0.25% -1:200000 IJ SOLN
INTRAMUSCULAR | Status: AC
  Filled 2024-11-29: qty 30

## 2024-11-29 MED ORDER — DEXAMETHASONE SOD PHOSPHATE PF 10 MG/ML IJ SOLN
INTRAMUSCULAR | Status: DC | PRN
  Administered 2024-11-29: 10 mg via INTRAVENOUS

## 2024-11-29 MED ORDER — ROCURONIUM BROMIDE 10 MG/ML (PF) SYRINGE
PREFILLED_SYRINGE | INTRAVENOUS | Status: DC | PRN
  Administered 2024-11-29: 20 mg via INTRAVENOUS
  Administered 2024-11-29: 10 mg via INTRAVENOUS
  Administered 2024-11-29: 30 mg via INTRAVENOUS
  Administered 2024-11-29: 20 mg via INTRAVENOUS

## 2024-11-29 MED ORDER — HYDROMORPHONE HCL 1 MG/ML IJ SOLN
0.2500 mg | INTRAMUSCULAR | Status: DC | PRN
  Administered 2024-11-29 (×4): 0.5 mg via INTRAVENOUS

## 2024-11-29 MED ORDER — LIDOCAINE HCL 1 % IJ SOLN
INTRAMUSCULAR | Status: AC
  Filled 2024-11-29: qty 20

## 2024-11-29 MED ORDER — OXYCODONE HCL 5 MG PO TABS
5.0000 mg | ORAL_TABLET | ORAL | Status: DC | PRN
  Administered 2024-11-29: 10 mg via ORAL
  Filled 2024-11-29: qty 2

## 2024-11-29 MED ORDER — CEFAZOLIN SODIUM-DEXTROSE 2-4 GM/100ML-% IV SOLN
INTRAVENOUS | Status: AC
  Filled 2024-11-29: qty 100

## 2024-11-29 MED ORDER — HYDROMORPHONE HCL 1 MG/ML IJ SOLN
INTRAMUSCULAR | Status: AC
  Filled 2024-11-29: qty 1

## 2024-11-29 MED ORDER — ONDANSETRON HCL 4 MG/2ML IJ SOLN
4.0000 mg | Freq: Four times a day (QID) | INTRAMUSCULAR | Status: DC | PRN
  Administered 2024-11-29: 4 mg via INTRAVENOUS
  Filled 2024-11-29: qty 2

## 2024-11-29 MED ORDER — AMISULPRIDE (ANTIEMETIC) 5 MG/2ML IV SOLN
INTRAVENOUS | Status: AC
  Filled 2024-11-29: qty 4

## 2024-11-29 MED ORDER — MIDAZOLAM HCL (PF) 2 MG/2ML IJ SOLN
INTRAMUSCULAR | Status: DC | PRN
  Administered 2024-11-29: 2 mg via INTRAVENOUS

## 2024-11-29 MED ORDER — PROPOFOL 10 MG/ML IV BOLUS
INTRAVENOUS | Status: DC | PRN
  Administered 2024-11-29: 200 mg via INTRAVENOUS
  Administered 2024-11-29: 50 mg via INTRAVENOUS

## 2024-11-29 MED ORDER — DEXMEDETOMIDINE HCL IN NACL 80 MCG/20ML IV SOLN
INTRAVENOUS | Status: AC
  Filled 2024-11-29: qty 20

## 2024-11-29 MED ORDER — BUSPIRONE HCL 15 MG PO TABS
15.0000 mg | ORAL_TABLET | Freq: Three times a day (TID) | ORAL | Status: DC
  Administered 2024-11-29 (×2): 15 mg via ORAL
  Filled 2024-11-29: qty 3
  Filled 2024-11-29 (×3): qty 1
  Filled 2024-11-29: qty 3

## 2024-11-29 MED ORDER — FENTANYL CITRATE (PF) 50 MCG/ML IJ SOSY
50.0000 ug | PREFILLED_SYRINGE | INTRAMUSCULAR | Status: DC | PRN
  Administered 2024-11-29 (×2): 50 ug via INTRAVENOUS
  Filled 2024-11-29 (×2): qty 1

## 2024-11-29 MED ORDER — MIDAZOLAM HCL (PF) 2 MG/2ML IJ SOLN
0.5000 mg | Freq: Once | INTRAMUSCULAR | Status: AC | PRN
  Administered 2024-11-29 (×2): 1 mg via INTRAVENOUS

## 2024-11-29 MED ORDER — MIDAZOLAM HCL 2 MG/2ML IJ SOLN
INTRAMUSCULAR | Status: AC
  Filled 2024-11-29: qty 2

## 2024-11-29 MED ORDER — HYDROMORPHONE HCL 1 MG/ML IJ SOLN
INTRAMUSCULAR | Status: AC
  Filled 2024-11-29: qty 0.5

## 2024-11-29 MED ORDER — DEXMEDETOMIDINE HCL IN NACL 80 MCG/20ML IV SOLN
INTRAVENOUS | Status: DC | PRN
  Administered 2024-11-29: 8 ug via INTRAVENOUS
  Administered 2024-11-29: 12 ug via INTRAVENOUS
  Administered 2024-11-29: 8 ug via INTRAVENOUS

## 2024-11-29 MED ORDER — KETAMINE HCL 50 MG/5ML IJ SOSY
PREFILLED_SYRINGE | INTRAMUSCULAR | Status: DC | PRN
  Administered 2024-11-29: 10 mg via INTRAVENOUS
  Administered 2024-11-29 (×2): 15 mg via INTRAVENOUS
  Administered 2024-11-29: 10 mg via INTRAVENOUS

## 2024-11-29 MED ORDER — SUCCINYLCHOLINE CHLORIDE 200 MG/10ML IV SOSY
PREFILLED_SYRINGE | INTRAVENOUS | Status: DC | PRN
  Administered 2024-11-29: 140 mg via INTRAVENOUS

## 2024-11-29 MED ORDER — OXYCODONE HCL 5 MG PO TABS
5.0000 mg | ORAL_TABLET | Freq: Four times a day (QID) | ORAL | 0 refills | Status: AC | PRN
  Filled 2024-11-29: qty 20, 5d supply, fill #0

## 2024-11-29 MED ORDER — KETOROLAC TROMETHAMINE 30 MG/ML IJ SOLN
INTRAMUSCULAR | Status: AC
  Filled 2024-11-29: qty 1

## 2024-11-29 MED ORDER — OXYCODONE HCL 5 MG/5ML PO SOLN: 5.0000 mg | Freq: Once | ORAL | Status: DC | PRN

## 2024-11-29 MED ORDER — CEFAZOLIN SODIUM-DEXTROSE 2-4 GM/100ML-% IV SOLN
2.0000 g | INTRAVENOUS | Status: AC
  Administered 2024-11-29: 2 g via INTRAVENOUS

## 2024-11-29 MED ORDER — ACETAMINOPHEN 500 MG PO TABS
1000.0000 mg | ORAL_TABLET | Freq: Four times a day (QID) | ORAL | 0 refills | Status: AC
  Filled 2024-11-29: qty 30, 4d supply, fill #0

## 2024-11-29 MED ORDER — BUPIVACAINE-EPINEPHRINE 0.25% -1:200000 IJ SOLN
INTRAMUSCULAR | Status: DC | PRN
  Administered 2024-11-29: 30 mL

## 2024-11-29 MED ORDER — ACETAMINOPHEN 500 MG PO TABS
1000.0000 mg | ORAL_TABLET | Freq: Four times a day (QID) | ORAL | Status: DC
  Administered 2024-11-29: 1000 mg via ORAL
  Filled 2024-11-29 (×2): qty 2

## 2024-11-29 MED ORDER — ACETAMINOPHEN 10 MG/ML IV SOLN
INTRAVENOUS | Status: AC
  Filled 2024-11-29: qty 100

## 2024-11-29 MED ORDER — SUGAMMADEX SODIUM 200 MG/2ML IV SOLN
INTRAVENOUS | Status: DC | PRN
  Administered 2024-11-29: 200 mg via INTRAVENOUS

## 2024-11-29 MED ORDER — CLONAZEPAM 0.5 MG PO TABS
0.5000 mg | ORAL_TABLET | Freq: Two times a day (BID) | ORAL | Status: DC
  Administered 2024-11-29: 0.5 mg via ORAL
  Filled 2024-11-29: qty 1

## 2024-11-29 MED ORDER — OXYCODONE HCL 5 MG PO TABS: 5.0000 mg | ORAL_TABLET | Freq: Once | ORAL | Status: DC | PRN

## 2024-11-29 MED ORDER — ROCURONIUM BROMIDE 10 MG/ML (PF) SYRINGE
PREFILLED_SYRINGE | INTRAVENOUS | Status: AC
  Filled 2024-11-29: qty 10

## 2024-11-29 MED ORDER — SODIUM CHLORIDE 0.9 % IR SOLN
Status: DC | PRN
  Administered 2024-11-29: 1000 mL

## 2024-11-29 MED ORDER — ONDANSETRON HCL 4 MG/2ML IJ SOLN
INTRAMUSCULAR | Status: DC | PRN
  Administered 2024-11-29: 4 mg via INTRAVENOUS

## 2024-11-29 MED ORDER — ONDANSETRON 4 MG PO TBDP: 4.0000 mg | ORAL_TABLET | Freq: Four times a day (QID) | ORAL | Status: DC | PRN

## 2024-11-29 MED ORDER — HYDROMORPHONE HCL 1 MG/ML IJ SOLN
INTRAMUSCULAR | Status: DC | PRN
  Administered 2024-11-29 (×3): .5 mg via INTRAVENOUS

## 2024-11-29 MED ORDER — KETAMINE HCL 50 MG/5ML IJ SOSY
PREFILLED_SYRINGE | INTRAMUSCULAR | Status: AC
  Filled 2024-11-29: qty 5

## 2024-11-29 MED ORDER — LACTATED RINGERS IV SOLN: INTRAVENOUS | Status: DC | PRN

## 2024-11-29 MED ORDER — HYDROMORPHONE HCL 1 MG/ML IJ SOLN: 1.0000 mg | INTRAMUSCULAR | Status: DC | PRN

## 2024-11-29 MED ORDER — AMISULPRIDE (ANTIEMETIC) 5 MG/2ML IV SOLN
10.0000 mg | Freq: Once | INTRAVENOUS | Status: AC
  Administered 2024-11-29: 10 mg via INTRAVENOUS

## 2024-11-29 MED ORDER — FENTANYL CITRATE (PF) 100 MCG/2ML IJ SOLN: INTRAMUSCULAR | Status: DC | PRN

## 2024-11-29 MED ORDER — PROPOFOL 10 MG/ML IV BOLUS
INTRAVENOUS | Status: AC
  Filled 2024-11-29: qty 20

## 2024-11-29 MED ORDER — KCL IN DEXTROSE-NACL 20-5-0.45 MEQ/L-%-% IV SOLN
INTRAVENOUS | Status: DC
  Filled 2024-11-29: qty 1000

## 2024-11-29 MED ORDER — ACETAMINOPHEN 10 MG/ML IV SOLN
INTRAVENOUS | Status: DC | PRN
  Administered 2024-11-29: 1000 mg via INTRAVENOUS

## 2024-11-29 NOTE — Discharge Summary (Signed)
 [facility] Discharge Summary   Patient ID: Debbie Griffin MRN: [id number] DOB/AGE: 41-Dec-1982 41 y.o.  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 Multiplanar reformatted images are provided for review. Automated exposure control, iterative reconstruction, and/or weight-based adjustment of the mA/kV was utilized to reduce the radiation dose to as low as reasonably achievable. COMPARISON: Comparison with CT early or today. CLINICAL HISTORY: Abdominal pain, acute, nonlocalized; reeval for appaerance of intuss. pain now resolving. FINDINGS: LOWER CHEST: No acute abnormality. LIVER: The liver is unremarkable. GALLBLADDER AND BILE DUCTS: Gallbladder is unremarkable. No biliary ductal dilatation. SPLEEN: No acute abnormality. PANCREAS: No acute abnormality. ADRENAL GLANDS: No acute abnormality. KIDNEYS, URETERS AND BLADDER: Contrast from the CT early or today is present in the renal collecting systems and bladder. No stones in the kidneys or ureters. No hydronephrosis. No perinephric or periureteral stranding. GI  AND BOWEL: Stomach demonstrates no acute abnormality. Moderate colonic stool burden suggesting constipation. Normal appendix. Left lower quadrant small bowel intussusception is redemonstrated with fat density mass likely acting as the lead point (series 2, image 44-53). The presumed lipoma measures 3.3 x 1.4 cm. No evidence of obstruction. PERITONEUM AND RETROPERITONEUM: No ascites. No free air. VASCULATURE: Aorta is normal in caliber. LYMPH NODES: No lymphadenopathy. REPRODUCTIVE ORGANS: No acute abnormality. BONES AND SOFT TISSUES: No acute osseous abnormality. No focal soft tissue abnormality. IMPRESSION: 1. Left lower quadrant small bowel intussusception with a 3.3 x 1.4 cm fat density mass, likely a lipoma, acting as the lead point. No evidence of obstruction. 2. Moderate colonic stool burden suggesting constipation. Electronically signed by: Norman Gatlin MD 11/28/2024 09:47 PM EST RP Workstation: [id number]   CT CHEST ABDOMEN PELVIS W CONTRAST Result Date: 11/28/2024 CLINICAL DATA:  Hematemesis abdominal pain EXAM: CT CHEST, ABDOMEN, AND PELVIS WITH CONTRAST TECHNIQUE: Multidetector CT imaging of the chest, abdomen and pelvis was performed following the standard protocol during bolus administration of intravenous contrast. RADIATION DOSE REDUCTION: This exam was performed according to the departmental dose-optimization program which includes automated exposure control, adjustment of the mA and/or kV according to patient size and/or use of iterative reconstruction technique. CONTRAST:  OMNIPAQUE  IOHEXOL  300 MG/ML  SOLN COMPARISON:  CT 09/26/2023, 02/26/2023 FINDINGS: CT CHEST FINDINGS Cardiovascular: Nonaneurysmal aorta. Normal cardiac size. No pericardial effusion Mediastinum/Nodes: No enlarged mediastinal, hilar, or axillary lymph nodes. Thyroid  gland, trachea, and esophagus demonstrate no significant findings. Lungs/Pleura: No acute airspace disease, pleural effusion, or pneumothorax. Stable  triangular shaped right lower lobe pulmonary nodule measuring 5 mm abutting the fissure, series 3, image 94, no specific imaging follow-up recommended. Musculoskeletal: Sternum is intact.  No acute osseous abnormality CT ABDOMEN PELVIS FINDINGS Hepatobiliary: Distended gallbladder. No calcified stone or  biliary dilatation Pancreas: Unremarkable. No pancreatic ductal dilatation or surrounding inflammatory changes. Spleen: Normal in size without focal abnormality. Adrenals/Urinary Tract: Adrenal glands are unremarkable. Kidneys are normal, without renal calculi, focal lesion, or hydronephrosis. Bladder is unremarkable. Stomach/Bowel: Stomach within normal limits. Left lower quadrant small bowel intussusception, series 2 image 90 through 95. fat density mass in the left lower quadrant measuring 2.7 cm suspected to represent a small bowel lipoma, potential lead point. No definite bowel obstruction on this exam. No definite bowel wall thickening. Negative appendix Vascular/Lymphatic: No significant vascular findings are present. No enlarged abdominal or pelvic lymph nodes. Reproductive: Uterus and bilateral adnexa are unremarkable. Other: No ascites or free air Musculoskeletal: No acute or suspicious osseous abnormality IMPRESSION: 1. No CT evidence for acute intrathoracic abnormality. 2. Left lower quadrant small bowel intussusception, this appears to be related to a fat density mass in the left lower quadrant, suspect for small bowel lipoma. No definitive signs for obstruction on the current exam but given intussusception with probable lead point, recommend surgical consultation 3. Distended gallbladder without calcified stone or biliary dilatation. Electronically Signed   By: Luke Bun M.D.   On: 11/28/2024 19:30    Procedures Dr. Ann 11/29/24 Procedures: LAPAROSCOPY, DIAGNOSTIC Bilateral TAPP block EXCISION, SMALL INTESTINE with primary anastomosis LAPAROTOMY, EXPLORATORY  Hospital Course:  43 y/o F  who presented to [facility] with abdominal pain.  Workup showed intussusception with possible small bowel lipoma.  Patient was admitted and underwent procedure listed above.  Tolerated procedure well and was transferred to the floor.  Diet was advanced as tolerated.  On 11/29/24, the patient was voiding well, tolerating diet, having flatus, ambulating well, pain controlled, vital signs stable, incisions c/d/i and felt stable for discharge home.  Patient will follow up in our office as below and knows to call with questions or concerns.    I have personally reviewed the patients medication history on the [place] controlled substance database. Buprenorphine filled 11/20/24.   I did not personally evaluate this patient on the day of discharge, therefore the above information was obtained entirely from chart review.    Allergies as of 11/29/2024   No Known Allergies      Medication List     TAKE these medications    acetaminophen  500 MG tablet Commonly known as: TYLENOL  Take 2 tablets (1,000 mg total) by mouth every 6 (six) hours.   amphetamine-dextroamphetamine 10 MG tablet Commonly known as: ADDERALL Take 10 mg by mouth as needed (focus).   busPIRone  15 MG tablet Commonly known as: BUSPAR  Take 15 mg by mouth 3 (three) times daily.   clonazePAM  0.5 MG tablet Commonly known as: KLONOPIN  Take 0.5 mg by mouth 2 (two) times daily.   ibuprofen  200 MG tablet Commonly known as: ADVIL  Take 200 mg by mouth every 6 (six) hours as needed for mild pain (pain score 1-3).   lidocaine  5 % Commonly known as: Lidoderm  Place 1 patch onto the skin daily. Remove & Discard patch within 12 hours or as directed by MD   oxyCODONE  5 MG immediate release tablet Commonly known as: Oxy IR/ROXICODONE  Take 1 tablet (5 mg total) by mouth every 6 (six) hours as needed for up to 7 days for severe pain (pain score 7-10).   SUBOXONE SL Place 1 Film under the tongue in the morning and at bedtime.   trazodone  300 MG  tablet Commonly known as: DESYREL  Take 150 mg by mouth at bedtime.  Follow-up Information     Ann Fine, MD Follow up.   Specialty: General Surgery Why: our office is scheduling you for post-operative follow up. call to confirm appointment date/time. Contact information: [address] [place] [address] [phone]                 Signed: Almarie Griffin, [facility] 11/29/2024, 4:13 PM

## 2024-11-29 NOTE — Plan of Care (Signed)

## 2024-11-29 NOTE — Progress Notes (Signed)
 1 Day Post-Op   Subjective/Chief Complaint: Denies nausea.  No flatus or BM yet.  Pain ok   Objective: Vital signs in last 24 hours: Temp:  [98 F (36.7 C)-98.8 F (37.1 C)] 98.8 F (37.1 C) (12/26 0443) Pulse Rate:  [45-102] 47 (12/26 0443) Resp:  [8-23] 19 (12/26 0443) BP: (110-156)/(69-91) 135/86 (12/26 0443) SpO2:  [96 %-100 %] 97 % (12/26 0443) Weight:  [72.6 kg] 72.6 kg (12/26 0000)    Intake/Output from previous day: 12/25 0701 - 12/26 0700 In: 2373.8 [P.O.:100; I.V.:2073.8; IV Piggyback:200] Out: 1030 [Urine:850; Emesis/NG output:50; Blood:30] Intake/Output this shift: Total I/O In: 100 [P.O.:100] Out: -   Gen:  alert and oriented.  Looks uncomfortable Resp: breathing comfortably Abd:  soft, non tender, non distended, incision healing Ext:  warm.    Lab Results:  Recent Labs    11/28/24 1602  WBC 8.9  HGB 13.9  HCT 39.5  PLT 251   BMET Recent Labs    11/28/24 1730  NA 137  K 4.3  CL 100  CO2 20*  GLUCOSE 103*  BUN 16  CREATININE 0.92  CALCIUM  9.2   PT/INR Recent Labs    11/28/24 1602  LABPROT 13.8  INR 1.0   ABG No results for input(s): PHART, HCO3 in the last 72 hours.  Invalid input(s): PCO2, PO2  Studies/Results: CT ABDOMEN PELVIS WO CONTRAST Result Date: 11/28/2024 EXAM: CT ABDOMEN AND PELVIS WITHOUT CONTRAST 11/28/2024 09:37:12 PM TECHNIQUE: CT of the abdomen and pelvis was performed without the administration of intravenous contrast. Multiplanar reformatted images are provided for review. Automated exposure control, iterative reconstruction, and/or weight-based adjustment of the mA/kV was utilized to reduce the radiation dose to as low as reasonably achievable. COMPARISON: Comparison with CT early or today. CLINICAL HISTORY: Abdominal pain, acute, nonlocalized; reeval for appaerance of intuss. pain now resolving. FINDINGS: LOWER CHEST: No acute abnormality. LIVER: The liver is unremarkable. GALLBLADDER AND BILE DUCTS:  Gallbladder is unremarkable. No biliary ductal dilatation. SPLEEN: No acute abnormality. PANCREAS: No acute abnormality. ADRENAL GLANDS: No acute abnormality. KIDNEYS, URETERS AND BLADDER: Contrast from the CT early or today is present in the renal collecting systems and bladder. No stones in the kidneys or ureters. No hydronephrosis. No perinephric or periureteral stranding. GI AND BOWEL: Stomach demonstrates no acute abnormality. Moderate colonic stool burden suggesting constipation. Normal appendix. Left lower quadrant small bowel intussusception is redemonstrated with fat density mass likely acting as the lead point (series 2, image 44-53). The presumed lipoma measures 3.3 x 1.4 cm. No evidence of obstruction. PERITONEUM AND RETROPERITONEUM: No ascites. No free air. VASCULATURE: Aorta is normal in caliber. LYMPH NODES: No lymphadenopathy. REPRODUCTIVE ORGANS: No acute abnormality. BONES AND SOFT TISSUES: No acute osseous abnormality. No focal soft tissue abnormality. IMPRESSION: 1. Left lower quadrant small bowel intussusception with a 3.3 x 1.4 cm fat density mass, likely a lipoma, acting as the lead point. No evidence of obstruction. 2. Moderate colonic stool burden suggesting constipation. Electronically signed by: Norman Gatlin MD 11/28/2024 09:47 PM EST RP Workstation: [id number]   CT CHEST ABDOMEN PELVIS W CONTRAST Result Date: 11/28/2024 CLINICAL DATA:  Hematemesis abdominal pain EXAM: CT CHEST, ABDOMEN, AND PELVIS WITH CONTRAST TECHNIQUE: Multidetector CT imaging of the chest, abdomen and pelvis was performed following the standard protocol during bolus administration of intravenous contrast. RADIATION DOSE REDUCTION: This exam was performed according to the departmental dose-optimization program which includes automated exposure control, adjustment of the mA and/or kV according to patient size and/or use of  iterative reconstruction technique. CONTRAST:  OMNIPAQUE  IOHEXOL  300 MG/ML  SOLN  COMPARISON:  CT 09/26/2023, 02/26/2023 FINDINGS: CT CHEST FINDINGS Cardiovascular: Nonaneurysmal aorta. Normal cardiac size. No pericardial effusion Mediastinum/Nodes: No enlarged mediastinal, hilar, or axillary lymph nodes. Thyroid  gland, trachea, and esophagus demonstrate no significant findings. Lungs/Pleura: No acute airspace disease, pleural effusion, or pneumothorax. Stable triangular shaped right lower lobe pulmonary nodule measuring 5 mm abutting the fissure, series 3, image 94, no specific imaging follow-up recommended. Musculoskeletal: Sternum is intact.  No acute osseous abnormality CT ABDOMEN PELVIS FINDINGS Hepatobiliary: Distended gallbladder. No calcified stone or biliary dilatation Pancreas: Unremarkable. No pancreatic ductal dilatation or surrounding inflammatory changes. Spleen: Normal in size without focal abnormality. Adrenals/Urinary Tract: Adrenal glands are unremarkable. Kidneys are normal, without renal calculi, focal lesion, or hydronephrosis. Bladder is unremarkable. Stomach/Bowel: Stomach within normal limits. Left lower quadrant small bowel intussusception, series 2 image 90 through 95. fat density mass in the left lower quadrant measuring 2.7 cm suspected to represent a small bowel lipoma, potential lead point. No definite bowel obstruction on this exam. No definite bowel wall thickening. Negative appendix Vascular/Lymphatic: No significant vascular findings are present. No enlarged abdominal or pelvic lymph nodes. Reproductive: Uterus and bilateral adnexa are unremarkable. Other: No ascites or free air Musculoskeletal: No acute or suspicious osseous abnormality IMPRESSION: 1. No CT evidence for acute intrathoracic abnormality. 2. Left lower quadrant small bowel intussusception, this appears to be related to a fat density mass in the left lower quadrant, suspect for small bowel lipoma. No definitive signs for obstruction on the current exam but given intussusception with probable lead  point, recommend surgical consultation 3. Distended gallbladder without calcified stone or biliary dilatation. Electronically Signed   By: Luke Bun M.D.   On: 11/28/2024 19:30    Anti-infectives: Anti-infectives (From admission, onward)    Start     Dose/Rate Route Frequency Ordered Stop   11/29/24 0600  ceFAZolin  (ANCEF ) IVPB 2g/100 mL premix        2 g 200 mL/hr over 30 Minutes Intravenous On call to O.R. 11/29/24 0048 11/29/24 0131       Assessment/Plan: s/p Procedures: LAPAROSCOPY, DIAGNOSTIC bilateral tap block (N/A) EXCISION, SMALL INTESTINE primary anastomosis (N/A) LAPAROTOMY, EXPLORATORY 11/28/24 for small bowel intussusception- Dr. Ann  d/c foley Advance diet Saline lock iv fluids Would wait until flatus prior to d/c.   H/o opiate use - is supposed to be on suboxone, but we don't have appropriate dose.  Continuing on oral and IV narcotics prn.   LOS: 0 days    Jina LITTIE Nephew, MD, FACS, FSSO Surgical Oncology, General Surgery, Trauma and Critical [facility], [place] [phone] for weekday/non holidays Check [email or web address] for coverage night/weekend/holidays under General Surgery   11/29/2024

## 2024-11-29 NOTE — Progress Notes (Signed)
 Patient reported severe pain at level 10 Worst pain on her abdomen.Richerd Silversmith, MD paged with new order for pain.Restlessness noted and patient accidentally pulled her NG tube out. Md made aware.

## 2024-11-29 NOTE — Anesthesia Postprocedure Evaluation (Addendum)
"   Anesthesia Post Note  Patient: Debbie Griffin  Procedure(s) Performed: LAPAROSCOPY, DIAGNOSTIC bilateral tap block (Abdomen) EXCISION, SMALL INTESTINE primary anastomosis (Abdomen) LAPAROTOMY, EXPLORATORY (Abdomen)     Patient location during evaluation: PACU Anesthesia Type: General Level of consciousness: sedated, patient cooperative and oriented Pain control: pain difficult to control, improving. Vital Signs Assessment: post-procedure vital signs reviewed and stable Respiratory status: spontaneous breathing, nonlabored ventilation, respiratory function stable and patient connected to nasal cannula oxygen Cardiovascular status: blood pressure returned to baseline and stable Postop Assessment: no apparent nausea or vomiting Anesthetic complications: no   No notable events documented.  Last Vitals:  Vitals:   11/29/24 0430 11/29/24 0443  BP:  135/86  Pulse: (!) 47 (!) 47  Resp: (!) 23 19  Temp: 37.1 C 37.1 C  SpO2: 96% 97%    Last Pain:  Vitals:   11/29/24 0443  TempSrc: Oral  PainSc:                  Rubens Cranston,E. Milagros Middendorf      "

## 2024-11-29 NOTE — Progress Notes (Signed)
 Reviewed AVS, patient expressed understanding of medications, MD follow up reviewed.    Patient states all belongings brought to the hospital at time of admission are accounted for and packed to take home.   Picked up medications from [facility] pharmacy.  [facility] [facility] contacted to transport patient to entrance A, where family member was waiting in vehicle to transport home.

## 2024-11-29 NOTE — Transfer of Care (Signed)
 Immediate Anesthesia Transfer of Care Note  Patient: Debbie Griffin  Procedure(s) Performed: LAPAROSCOPY, DIAGNOSTIC bilateral tap block (Abdomen) EXCISION, SMALL INTESTINE primary anastomosis (Abdomen) LAPAROTOMY, EXPLORATORY (Abdomen)  Patient Location: PACU  Anesthesia Type:General  Level of Consciousness: awake and alert   Airway & Oxygen Therapy: Patient Spontanous Breathing and Patient connected to nasal cannula oxygen  Post-op Assessment: Report given to RN and Post -op Vital signs reviewed and stable  Post vital signs: Reviewed and stable  Last Vitals:  Vitals Value Taken Time  BP 126/77 11/29/24 03:07  Temp 36.7 C 11/29/24 03:07  Pulse 45 11/29/24 03:14  Resp 8 11/29/24 03:14  SpO2 99 % 11/29/24 03:14  Vitals shown include unfiled device data.  Last Pain:  Vitals:   11/29/24 0314  TempSrc:   PainSc: 10-Worst pain ever         Complications: No notable events documented.

## 2024-11-29 NOTE — Anesthesia Procedure Notes (Signed)
 Procedure Name: Intubation Date/Time: 11/29/2024 1:00 AM  Performed by: Roddie Grate, CRNAPre-anesthesia Checklist: Patient identified, Emergency Drugs available, Suction available, Patient being monitored and Timeout performed Patient Re-evaluated:Patient Re-evaluated prior to induction Oxygen Delivery Method: Circle system utilized Preoxygenation: Pre-oxygenation with 100% oxygen Induction Type: IV induction, Rapid sequence and Cricoid Pressure applied Laryngoscope Size: Mac and 3 Grade View: Grade I Tube type: Oral Tube size: 7.0 mm Number of attempts: 1 Airway Equipment and Method: Stylet Placement Confirmation: ETT inserted through vocal cords under direct vision, positive ETCO2 and breath sounds checked- equal and bilateral Secured at: 22 cm Tube secured with: Tape Dental Injury: Teeth and Oropharynx as per pre-operative assessment  Comments: Smooth IV Induction. Eyes taped. RSI Performed. DL x 1 with grade 1 view. Atraumatically placed, teeth and lip remain intact as pre-op. Secured with tape. Bilateral breath sounds +/=, EtCO2 +, Adequate TV, VSS.

## 2024-11-29 NOTE — Plan of Care (Signed)
  Problem: Education: Goal: Knowledge of General Education information will improve Description: Including pain rating scale, medication(s)/side effects and non-pharmacologic comfort measures Outcome: Adequate for Discharge   Problem: Activity: Goal: Risk for activity intolerance will decrease Outcome: Adequate for Discharge   Problem: Nutrition: Goal: Adequate nutrition will be maintained Outcome: Adequate for Discharge   Problem: Pain Managment: Goal: General experience of comfort will improve and/or be controlled Outcome: Adequate for Discharge

## 2024-11-29 NOTE — Op Note (Addendum)
 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  Segment of approximately 8 cm of small bowel resected and primary anastomosis performed  Anesthesia: General Estimated Blood Loss: 30 mL  Complications: None Specimens:  ID Type Source Tests Collected by Time Destination  1 : small bowel intussuption Tissue PATH GI benign resection SURGICAL PATHOLOGY Ann Fine, MD 11/29/2024 [phone]     Procedure Details  Prior to the procedure, the risks, benefits, complications, treatment options, and expected outcomes were discussed with the patient and/or family, including but not limited to, the risks of bleeding, infection, and injury to her surrounding structures.  Discussed with patient that there is a chance that the intussusception was an incidental finding on imaging and may not be present or located in the operating room.  We discussed conversion to open to facilitate examination of the bowel as well as bowel resection if any abnormalities were noted.  Despite the risks, the patient has given informed consent for operative intervention.  The patient was taken to the Operating Room, identified as Debbie Griffin and the procedure verified as diagnostic laparoscopy, possible laparotomy, possible small bowel resection.  Identification pause was held and the above information confirmed.  The patient was placed in the supine position and General endotracheal anesthesia was  induced. The abdomen was prepped with chloraprep and draped in the typical sterile fashion.  A formal preincision time out was performed.  The 5 mm port site incision was made in the right upper quadrant and the abdomen was entered using the Optiview technique with a 0 degree scope and 5 mm port.  Once the abdomen was entered, abdomen was insufflated to 15 mmHg and laparoscopy revealed no injuries.  2 additional 5mm ports were placed in the right hemiabdomen under direct visualization without any evidence of injury.  The small bowel was run from the ligament of Treitz to the terminal ileum and the area of intussuscepted bowel was noted.  A grasper was placed on this piece of bowel and then attention was turned to conversion to open laparotomy.  Headline incision was made using a 15 blade dissection was carried down to the fascia.  The fascia was then divided, and the peritoneum was sharply divided and the fascia and peritoneal divide was extended along the length of the skin incision.  The small bowel within the laparoscopic grasper was located and then eviscerated.  A window was made in the mesentery both proximal and distal to the intussuscepted bowel and small bowel lipoma and using 2 separate loads of the 75 GIA stapler the segment of bowel was resected.  A LigaSure was used to divide the mesentery of the small bowel.  The specimen was then removed and sent for pathology.  The remaining bowel was then fashioned in a side-to-side functional and primary anastomosis using an additional GIA 75 blue load to create a common enterotomy, and then an additional load to close the common enterotomy.  Care was taken to achieve hemostasis as the staple lines were quite oozy initially.  After meticulous hemostasis was performed the bowel appeared healthy and viable.  The  enteric defect was then closed using interrupted figure-of-eight's with 3-0 Vicryl.  The bowel was then returned to the abdomen. A bilateral TAPP  block was then performed using 30cc of 0.25% marcaine  with epi. The fascia of the midline incision was closed using 2 running 2-0 PDS.  Abdomen was reinsufflated and laparoscope reinserted and confirmed that no twisting of the mesentery in that anastomosis appeared healthy.  Abdomen was then desufflated and ports removed.  Attention turned back to closure and subcutaneous tissue was closed with interrupted 3-0 Vicryl.  Skin was closed with 4-0 Monocryl in a subcuticular fashion, upon applied to the skin.  Port sites closed with 4-0 Monocryl and Dermabond applied to the skin.  Instrument, sponge, and needle counts were correct at the conclusion of the case.    Orie Silversmith, MD General Surgery, Surgical Critical Care and Trauma

## 2024-11-29 NOTE — Plan of Care (Signed)
  Problem: Clinical Measurements: Goal: Will remain free from infection Outcome: Progressing   Problem: Coping: Goal: Level of anxiety will decrease Outcome: Progressing   Problem: Pain Managment: Goal: General experience of comfort will improve and/or be controlled Outcome: Progressing

## 2024-12-02 LAB — SURGICAL PATHOLOGY

## 2024-12-05 ENCOUNTER — Emergency Department (HOSPITAL_BASED_OUTPATIENT_CLINIC_OR_DEPARTMENT_OTHER): Payer: MEDICAID

## 2024-12-05 ENCOUNTER — Emergency Department (HOSPITAL_BASED_OUTPATIENT_CLINIC_OR_DEPARTMENT_OTHER)
Admission: EM | Admit: 2024-12-05 | Discharge: 2024-12-05 | Disposition: A | Discharge status: Home / Self Care | Payer: MEDICAID | Attending: Emergency Medicine | Admitting: Emergency Medicine

## 2024-12-05 DIAGNOSIS — N3000 Acute cystitis without hematuria: Secondary | ICD-10-CM

## 2024-12-05 DIAGNOSIS — R109 Unspecified abdominal pain: Secondary | ICD-10-CM

## 2024-12-05 DIAGNOSIS — R1030 Lower abdominal pain, unspecified: Secondary | ICD-10-CM | POA: Insufficient documentation

## 2024-12-05 LAB — URINALYSIS, ROUTINE W REFLEX MICROSCOPIC
Bilirubin Urine: NEGATIVE
Glucose, UA: NEGATIVE mg/dL
Ketones, ur: NEGATIVE mg/dL
Nitrite: NEGATIVE
Protein, ur: NEGATIVE mg/dL
Specific Gravity, Urine: 1.01 (ref 1.005–1.030)
pH: 6.5 (ref 5.0–8.0)

## 2024-12-05 LAB — COMPREHENSIVE METABOLIC PANEL WITH GFR
ALT: 19 U/L (ref 0–44)
AST: 20 U/L (ref 15–41)
Albumin: 3.9 g/dL (ref 3.5–5.0)
Alkaline Phosphatase: 49 U/L (ref 38–126)
Anion gap: 8 (ref 5–15)
BUN: 8 mg/dL (ref 6–20)
CO2: 30 mmol/L (ref 22–32)
Calcium: 9.2 mg/dL (ref 8.9–10.3)
Chloride: 101 mmol/L (ref 98–111)
Creatinine, Ser: 0.71 mg/dL (ref 0.44–1.00)
GFR, Estimated: >60 mL/min
Glucose, Bld: 104 mg/dL — ABNORMAL HIGH (ref 70–99)
Potassium: 4.1 mmol/L (ref 3.5–5.1)
Sodium: 139 mmol/L (ref 135–145)
Total Bilirubin: 0.4 mg/dL (ref 0.0–1.2)
Total Protein: 6 g/dL — ABNORMAL LOW (ref 6.5–8.1)

## 2024-12-05 LAB — URINALYSIS, MICROSCOPIC (REFLEX): WBC, UA: >50 WBC/hpf (ref 0–5)

## 2024-12-05 LAB — PREGNANCY, URINE: Preg Test, Ur: NEGATIVE

## 2024-12-05 MED ORDER — IOHEXOL 300 MG/ML  SOLN
100.0000 mL | Freq: Once | INTRAMUSCULAR | Status: AC | PRN
  Administered 2024-12-05: 85 mL via INTRAVENOUS

## 2024-12-05 MED ORDER — CEPHALEXIN 500 MG PO CAPS: 500.0000 mg | ORAL_CAPSULE | Freq: Three times a day (TID) | ORAL | 0 refills | Status: AC

## 2024-12-05 MED ORDER — CEPHALEXIN 500 MG PO CAPS: 500.0000 mg | ORAL_CAPSULE | Freq: Three times a day (TID) | ORAL | 0 refills | Status: DC

## 2024-12-05 MED ORDER — SODIUM CHLORIDE 0.9 % IV BOLUS
1000.0000 mL | Freq: Once | INTRAVENOUS | Status: AC
  Administered 2024-12-05: 1000 mL via INTRAVENOUS

## 2024-12-05 MED ORDER — SODIUM CHLORIDE 0.9 % IV SOLN
1.0000 g | Freq: Once | INTRAVENOUS | Status: AC
  Administered 2024-12-05: 1 g via INTRAVENOUS
  Filled 2024-12-05: qty 10

## 2024-12-05 NOTE — Discharge Instructions (Signed)
 Begin taking Keflex  as prescribed.  Take ibuprofen  600 mg every 6 hours as needed for pain.  Follow-up with primary doctor if not improving in the next few days.

## 2024-12-05 NOTE — ED Triage Notes (Signed)
 Admit 12/25 for small bowel intussusception. Surgery 4am 12/26 discharge 12/26 4pm. Has abd pain. Vaginal bleeding. Dysuria.

## 2024-12-05 NOTE — ED Provider Notes (Signed)
 " [place] EMERGENCY DEPARTMENT AT [facility] Provider Note   CSN: [id number] Arrival date & time: 12/05/24  0114     Patient presents with: No chief complaint on file.   Debbie Griffin is a 44 y.o. female.   Patient is a 44 year old female presenting with complaints of lower abdominal pain.  She had surgery on 1225 for intussusception.  She is now having lower abdominal discomfort and is concerned she may have a UTI.  No fevers or chills.  No vomiting or diarrhea.       Prior to Admission medications  Medication Sig Start Date End Date Taking? Authorizing Provider  acetaminophen  (TYLENOL ) 500 MG tablet Take 2 tablets (1,000 mg total) by mouth every 6 (six) hours. 11/29/24   Augustus Almarie RAMAN, PA-C  amphetamine-dextroamphetamine (ADDERALL) 10 MG tablet Take 10 mg by mouth as needed (focus). 11/02/24   [provider]  Buprenorphine HCl-Naloxone  HCl (SUBOXONE SL) Place 1 Film under the tongue in the morning and at bedtime.    [provider]  busPIRone  (BUSPAR ) 15 MG tablet Take 15 mg by mouth 3 (three) times daily. 09/17/24   [provider]  clonazePAM  (KLONOPIN ) 0.5 MG tablet Take 0.5 mg by mouth 2 (two) times daily. 11/02/24   [provider]  ibuprofen  (ADVIL ) 200 MG tablet Take 200 mg by mouth every 6 (six) hours as needed for mild pain (pain score 1-3).    [provider]  lidocaine  (LIDODERM ) 5 % Place 1 patch onto the skin daily. Remove & Discard patch within 12 hours or as directed by MD 01/16/24   Donnajean Lynwood DEL, PA-C  oxyCODONE  (OXY IR/ROXICODONE ) 5 MG immediate release tablet Take 1 tablet (5 mg total) by mouth every 6 (six) hours as needed for up to 7 days for severe pain (pain score 7-10). 11/29/24 12/06/24  Augustus Almarie RAMAN, PA-C  trazodone  (DESYREL ) 300 MG tablet Take 150 mg by mouth at bedtime.    [provider]    Allergies: Patient has no known allergies.    Review of Systems  All other  systems reviewed and are negative.   Updated Vital Signs BP 102/75 (BP Location: Left Arm)   Pulse 96   Temp 97.8 F (36.6 C)   Resp 16   SpO2 96%   Physical Exam Vitals and nursing note reviewed.  Constitutional:      General: She is not in acute distress.    Appearance: She is well-developed. She is not diaphoretic.  HENT:     Head: Normocephalic and atraumatic.  Cardiovascular:     Rate and Rhythm: Normal rate and regular rhythm.     Heart sounds: No murmur heard.    No friction rub. No gallop.  Pulmonary:     Effort: Pulmonary effort is normal. No respiratory distress.     Breath sounds: Normal breath sounds. No wheezing.  Abdominal:     General: Bowel sounds are normal. There is no distension.     Palpations: Abdomen is soft.     Tenderness: There is abdominal tenderness. There is no guarding or rebound.     Comments: There is tenderness across the lower abdomen.  Musculoskeletal:        General: Normal range of motion.     Cervical back: Normal range of motion and neck supple.  Skin:    General: Skin is warm and dry.  Neurological:     General: No focal deficit present.     Mental Status:  She is alert and oriented to person, place, and time.     (all labs ordered are listed, but only abnormal results are displayed) Labs Reviewed  COMPREHENSIVE METABOLIC PANEL WITH GFR - Abnormal; Notable for the following components:      Result Value   Glucose, Bld 104 (*)    Total Protein 6.0 (*)    All other components within normal limits  URINALYSIS, ROUTINE W REFLEX MICROSCOPIC - Abnormal; Notable for the following components:   Color, Urine STRAW (*)    Hgb urine dipstick LARGE (*)    Leukocytes,Ua MODERATE (*)    All other components within normal limits  URINALYSIS, MICROSCOPIC (REFLEX) - Abnormal; Notable for the following components:   Bacteria, UA RARE (*)    All other components within normal limits  PREGNANCY, URINE  CBC    EKG: None  Radiology: CT  ABDOMEN PELVIS W CONTRAST Result Date: 12/05/2024 EXAM: CT ABDOMEN AND PELVIS WITH CONTRAST 12/05/2024 03:26:24 AM TECHNIQUE: CT of the abdomen and pelvis was performed with the administration of 85 mL of iohexol  (OMNIPAQUE ) 300 MG/ML solution. Multiplanar reformatted images are provided for review. Automated exposure control, iterative reconstruction, and/or weight-based adjustment of the mA/kV was utilized to reduce the radiation dose to as low as reasonably achievable. COMPARISON: 11/28/2024 CLINICAL HISTORY: Abdominal pain, acute, nonlocalized. FINDINGS: LOWER CHEST: No acute abnormality. LIVER: At the posterior aspect of the right hepatic lobe, there is a fat density focus measuring 2.6 x 0.7 cm (series 5 image 47) likely a small lipoma. However, on the prior study (11/28/2024), this finding was located at the hepatic dome (series 5 image 80). GALLBLADDER AND BILE DUCTS: Gallbladder is unremarkable. No biliary ductal dilatation. SPLEEN: No acute abnormality. PANCREAS: No acute abnormality. ADRENAL GLANDS: No acute abnormality. KIDNEYS, URETERS AND BLADDER: No stones in the kidneys or ureters. No hydronephrosis. No perinephric or periureteral stranding. Urinary bladder is unremarkable. GI AND BOWEL: Small bowel anastomosis in the left mid-abdomen at the site of prior intussusception. Stomach demonstrates no acute abnormality. There is no bowel obstruction. PERITONEUM AND RETROPERITONEUM: No ascites. No free air. VASCULATURE: Aorta is normal in caliber. LYMPH NODES: No lymphadenopathy. REPRODUCTIVE ORGANS: No acute abnormality. BONES AND SOFT TISSUES: No acute osseous abnormality. No focal soft tissue abnormality. IMPRESSION: 1. No acute findings in the abdomen or pelvis. 2. 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 Change in position compared to the prior study suggests that it may be free-floating or incompletely attached to the liver or diaphragm.  Electronically signed by: Franky Stanford MD 12/05/2024 04:06 AM EST RP Workstation: [id number]     Procedures   Medications Ordered in the ED  sodium chloride  0.9 % bolus 1,000 mL (1,000 mLs Intravenous New Bag/Given 12/05/24 0333)  iohexol  (OMNIPAQUE ) 300 MG/ML solution 100 mL (85 mLs Intravenous Contrast Given 12/05/24 0318)  cefTRIAXone (ROCEPHIN) 1 g in sodium chloride  0.9 % 100 mL IVPB (1 g Intravenous New Bag/Given 12/05/24 0423)                                    Medical Decision Making Amount and/or Complexity of Data Reviewed Labs: ordered. Radiology: ordered.  Risk Prescription drug management.   Patient is a 44 year old female presenting with lower abdominal discomfort as described in the HPI.  Patient arrives here with stable vital signs and is afebrile.  Physical examination reveals lower abdominal tenderness, but  no peritoneal signs.  Laboratory studies obtained including CBC, CMP, and urinalysis, all of which are unremarkable.  Pregnancy test negative.  Urinalysis does show large blood and moderate leukocytes.  CT scan of the abdomen and pelvis shows no postsurgical complications and no acute process.  Patient given IV fluids along with Rocephin for UTI.  At this point, pain seems to be related to UTI as there are no acute findings on the CT scan.  Patient to be discharged with outpatient follow-up.     Final diagnoses:  None    ED Discharge Orders     None          Geroldine Berg, MD 12/05/24 [phone]  "

## 2026-08-12 ENCOUNTER — Ambulatory Visit: Payer: MEDICAID | Admitting: Physician Assistant
